# Patient Record
Sex: Female | Born: 1960 | Race: White | Hispanic: No | Marital: Married | Smoking: Current every day smoker
Health system: Southern US, Community
[De-identification: ages and names within clinical notes are randomized; demographics above are authoritative.]

## PROBLEM LIST (undated history)

## (undated) DIAGNOSIS — F329 Major depressive disorder, single episode, unspecified: Secondary | ICD-10-CM

## (undated) DIAGNOSIS — D219 Benign neoplasm of connective and other soft tissue, unspecified: Secondary | ICD-10-CM

## (undated) DIAGNOSIS — D473 Essential (hemorrhagic) thrombocythemia: Secondary | ICD-10-CM

## (undated) DIAGNOSIS — R42 Dizziness and giddiness: Secondary | ICD-10-CM

## (undated) DIAGNOSIS — R920 Mammographic microcalcification found on diagnostic imaging of breast: Secondary | ICD-10-CM

## (undated) DIAGNOSIS — IMO0002 Reserved for concepts with insufficient information to code with codable children: Secondary | ICD-10-CM

## (undated) DIAGNOSIS — R87619 Unspecified abnormal cytological findings in specimens from cervix uteri: Secondary | ICD-10-CM

## (undated) DIAGNOSIS — N809 Endometriosis, unspecified: Secondary | ICD-10-CM

## (undated) DIAGNOSIS — N946 Dysmenorrhea, unspecified: Secondary | ICD-10-CM

## (undated) DIAGNOSIS — N939 Abnormal uterine and vaginal bleeding, unspecified: Secondary | ICD-10-CM

## (undated) DIAGNOSIS — F488 Other specified nonpsychotic mental disorders: Secondary | ICD-10-CM

## (undated) DIAGNOSIS — R0600 Dyspnea, unspecified: Secondary | ICD-10-CM

## (undated) DIAGNOSIS — A770 Spotted fever due to Rickettsia rickettsii: Secondary | ICD-10-CM

## (undated) DIAGNOSIS — F419 Anxiety disorder, unspecified: Secondary | ICD-10-CM

## (undated) DIAGNOSIS — F4310 Post-traumatic stress disorder, unspecified: Secondary | ICD-10-CM

## (undated) DIAGNOSIS — N9489 Other specified conditions associated with female genital organs and menstrual cycle: Secondary | ICD-10-CM

## (undated) DIAGNOSIS — D259 Leiomyoma of uterus, unspecified: Secondary | ICD-10-CM

## (undated) DIAGNOSIS — R0609 Other forms of dyspnea: Secondary | ICD-10-CM

## (undated) DIAGNOSIS — Z5189 Encounter for other specified aftercare: Secondary | ICD-10-CM

## (undated) DIAGNOSIS — E539 Vitamin B deficiency, unspecified: Secondary | ICD-10-CM

## (undated) DIAGNOSIS — F32A Depression, unspecified: Secondary | ICD-10-CM

## (undated) DIAGNOSIS — K219 Gastro-esophageal reflux disease without esophagitis: Secondary | ICD-10-CM

## (undated) DIAGNOSIS — F323 Major depressive disorder, single episode, severe with psychotic features: Secondary | ICD-10-CM

## (undated) DIAGNOSIS — D649 Anemia, unspecified: Secondary | ICD-10-CM

## (undated) DIAGNOSIS — K259 Gastric ulcer, unspecified as acute or chronic, without hemorrhage or perforation: Secondary | ICD-10-CM

## (undated) DIAGNOSIS — R7309 Other abnormal glucose: Secondary | ICD-10-CM

## (undated) DIAGNOSIS — J439 Emphysema, unspecified: Secondary | ICD-10-CM

## (undated) DIAGNOSIS — Z87442 Personal history of urinary calculi: Secondary | ICD-10-CM

## (undated) HISTORY — DX: Major depressive disorder, single episode, unspecified: F32.9

## (undated) HISTORY — DX: Dysmenorrhea, unspecified: N94.6

## (undated) HISTORY — DX: Depression, unspecified: F32.A

## (undated) HISTORY — DX: Abnormal uterine and vaginal bleeding, unspecified: N93.9

## (undated) HISTORY — DX: Other abnormal glucose: R73.09

## (undated) HISTORY — DX: Anxiety disorder, unspecified: F41.9

## (undated) HISTORY — DX: Essential (hemorrhagic) thrombocythemia: D47.3

## (undated) HISTORY — DX: Other specified nonpsychotic mental disorders: F48.8

## (undated) HISTORY — DX: Gastric ulcer, unspecified as acute or chronic, without hemorrhage or perforation: K25.9

## (undated) HISTORY — DX: Reserved for concepts with insufficient information to code with codable children: IMO0002

## (undated) HISTORY — DX: Leiomyoma of uterus, unspecified: D25.9

## (undated) HISTORY — DX: Vitamin B deficiency, unspecified: E53.9

## (undated) HISTORY — DX: Major depressive disorder, single episode, severe with psychotic features: F32.3

## (undated) HISTORY — DX: Spotted fever due to Rickettsia rickettsii: A77.0

## (undated) HISTORY — DX: Personal history of urinary calculi: Z87.442

## (undated) HISTORY — DX: Mammographic microcalcification found on diagnostic imaging of breast: R92.0

## (undated) HISTORY — DX: Encounter for other specified aftercare: Z51.89

## (undated) HISTORY — DX: Post-traumatic stress disorder, unspecified: F43.10

## (undated) HISTORY — DX: Unspecified abnormal cytological findings in specimens from cervix uteri: R87.619

## (undated) HISTORY — DX: Gastro-esophageal reflux disease without esophagitis: K21.9

## (undated) HISTORY — DX: Dizziness and giddiness: R42

## (undated) HISTORY — DX: Other specified conditions associated with female genital organs and menstrual cycle: N94.89

## (undated) HISTORY — DX: Other forms of dyspnea: R06.09

## (undated) HISTORY — DX: Anemia, unspecified: D64.9

## (undated) HISTORY — DX: Emphysema, unspecified: J43.9

## (undated) HISTORY — DX: Dyspnea, unspecified: R06.00

## (undated) HISTORY — DX: Benign neoplasm of connective and other soft tissue, unspecified: D21.9

## (undated) HISTORY — PX: PELVIC LAPAROSCOPY: SHX162

## (undated) HISTORY — DX: Endometriosis, unspecified: N80.9

## (undated) HISTORY — PX: TUBAL LIGATION: SHX77

---
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
## 2003-01-20 ENCOUNTER — Encounter: Admission: RE | Admit: 2003-01-20 | Discharge: 2003-01-20 | Payer: Self-pay | Admitting: Internal Medicine

## 2003-01-30 ENCOUNTER — Inpatient Hospital Stay (HOSPITAL_COMMUNITY): Admission: EM | Admit: 2003-01-30 | Discharge: 2003-01-31 | Payer: Self-pay | Admitting: Psychiatry

## 2003-02-02 ENCOUNTER — Other Ambulatory Visit (HOSPITAL_COMMUNITY): Admission: RE | Admit: 2003-02-02 | Discharge: 2003-02-04 | Payer: Self-pay | Admitting: Psychiatry

## 2006-09-07 DIAGNOSIS — E539 Vitamin B deficiency, unspecified: Secondary | ICD-10-CM | POA: Insufficient documentation

## 2006-09-07 HISTORY — DX: Vitamin B deficiency, unspecified: E53.9

## 2008-01-10 HISTORY — PX: ESSURE TUBAL LIGATION: SUR464

## 2008-01-10 HISTORY — PX: COLPOSCOPY: SHX161

## 2008-02-05 DIAGNOSIS — K219 Gastro-esophageal reflux disease without esophagitis: Secondary | ICD-10-CM

## 2008-02-05 HISTORY — DX: Gastro-esophageal reflux disease without esophagitis: K21.9

## 2012-01-10 DIAGNOSIS — D473 Essential (hemorrhagic) thrombocythemia: Secondary | ICD-10-CM

## 2012-01-10 DIAGNOSIS — D75839 Thrombocytosis, unspecified: Secondary | ICD-10-CM

## 2012-01-10 DIAGNOSIS — K259 Gastric ulcer, unspecified as acute or chronic, without hemorrhage or perforation: Secondary | ICD-10-CM

## 2012-01-10 DIAGNOSIS — R7309 Other abnormal glucose: Secondary | ICD-10-CM

## 2012-01-10 HISTORY — DX: Gastric ulcer, unspecified as acute or chronic, without hemorrhage or perforation: K25.9

## 2012-01-10 HISTORY — DX: Other abnormal glucose: R73.09

## 2012-01-10 HISTORY — DX: Essential (hemorrhagic) thrombocythemia: D47.3

## 2012-01-10 HISTORY — DX: Thrombocytosis, unspecified: D75.839

## 2012-06-17 DIAGNOSIS — R920 Mammographic microcalcification found on diagnostic imaging of breast: Secondary | ICD-10-CM | POA: Insufficient documentation

## 2012-06-17 HISTORY — DX: Mammographic microcalcification found on diagnostic imaging of breast: R92.0

## 2012-07-18 ENCOUNTER — Emergency Department (HOSPITAL_COMMUNITY): Payer: Self-pay

## 2012-07-18 ENCOUNTER — Encounter (HOSPITAL_COMMUNITY): Payer: Self-pay | Admitting: Emergency Medicine

## 2012-07-18 ENCOUNTER — Emergency Department (HOSPITAL_COMMUNITY)
Admission: EM | Admit: 2012-07-18 | Discharge: 2012-07-19 | Disposition: A | Discharge status: Other Acute Inpatient Hospital | Payer: Self-pay | Attending: Emergency Medicine | Admitting: Emergency Medicine

## 2012-07-18 DIAGNOSIS — F323 Major depressive disorder, single episode, severe with psychotic features: Secondary | ICD-10-CM

## 2012-07-18 DIAGNOSIS — R0602 Shortness of breath: Secondary | ICD-10-CM | POA: Insufficient documentation

## 2012-07-18 DIAGNOSIS — F410 Panic disorder [episodic paroxysmal anxiety] without agoraphobia: Secondary | ICD-10-CM

## 2012-07-18 DIAGNOSIS — F329 Major depressive disorder, single episode, unspecified: Secondary | ICD-10-CM | POA: Insufficient documentation

## 2012-07-18 DIAGNOSIS — R45851 Suicidal ideations: Secondary | ICD-10-CM | POA: Insufficient documentation

## 2012-07-18 DIAGNOSIS — R0789 Other chest pain: Secondary | ICD-10-CM | POA: Insufficient documentation

## 2012-07-18 LAB — CBC WITH DIFFERENTIAL/PLATELET
Basophils Absolute: 0 10*3/uL (ref 0.0–0.1)
Eosinophils Relative: 1 % (ref 0–5)
HCT: 41 % (ref 36.0–46.0)
Hemoglobin: 14.9 g/dL (ref 12.0–15.0)
Lymphocytes Relative: 16 % (ref 12–46)
MCHC: 36.3 g/dL — ABNORMAL HIGH (ref 30.0–36.0)
MCV: 94.5 fL (ref 78.0–100.0)
Monocytes Absolute: 1.1 10*3/uL — ABNORMAL HIGH (ref 0.1–1.0)
Monocytes Relative: 8 % (ref 3–12)
Neutro Abs: 10.8 10*3/uL — ABNORMAL HIGH (ref 1.7–7.7)
RDW: 12.6 % (ref 11.5–15.5)
WBC: 14.4 10*3/uL — ABNORMAL HIGH (ref 4.0–10.5)

## 2012-07-18 LAB — COMPREHENSIVE METABOLIC PANEL
AST: 22 U/L (ref 0–37)
BUN: 5 mg/dL — ABNORMAL LOW (ref 6–23)
CO2: 23 mEq/L (ref 19–32)
Calcium: 9.5 mg/dL (ref 8.4–10.5)
Chloride: 100 mEq/L (ref 96–112)
Creatinine, Ser: 0.6 mg/dL (ref 0.50–1.10)
GFR calc Af Amer: >90 mL/min (ref >90)
GFR calc non Af Amer: >90 mL/min (ref >90)
Total Bilirubin: 0.5 mg/dL (ref 0.3–1.2)

## 2012-07-18 LAB — POCT I-STAT TROPONIN I: Troponin i, poc: 0 ng/mL (ref 0.00–0.08)

## 2012-07-18 NOTE — ED Provider Notes (Signed)
History    CSN: [id number] Arrival date & time 07/18/12  2039  First MD Initiated Contact with Patient 07/18/12 2349     Chief Complaint  Patient presents with  . Panic Attack   (Consider location/radiation/quality/duration/timing/severity/associated sxs/prior Treatment) HPI Level 5 Caveat: refusing to answer questions coherently. This is a 52 year old female who received a document advising her of lawsuit about 2 weeks ago. Her husband states that subsequently she has become very at stated, depressed with difficulty sleeping and increasingly frequent panic attacks. She also believes that she is being followed and that someone such as the FBI is out to get her.  She is here this evening having had a panic attack about 5:30 PM while in a parking lot. The panic attack was described as difficulty catching her breath, palpitations and chest tightness. Her symptoms are consistent with prior panic attacks. When asked how long her panic attack lasted she replied "not long enough". When asked what this meant she replied "sometimes you can just work them out". She was unable to further explain her statements. The symptoms have largely resolved.  While in the ED she was observed taking the pill in the bathroom. She admits to taking 2 hydrocodone tablets and subsequently admitted to taking "a handful". She states she took the first 2 hydrocodone for headache. She subsequently admits that she is suicidal.  History reviewed. No pertinent past medical history. History reviewed. No pertinent past surgical history. No family history on file. History  Substance Use Topics  . Smoking status: Never Smoker   . Smokeless tobacco: Not on file  . Alcohol Use: No   OB History   Grav Para Term Preterm Abortions TAB SAB Ect Mult Living                 Review of Systems  Unable to perform ROS   Allergies  Review of patient's allergies indicates no known allergies.  Home Medications  No current  outpatient prescriptions on file. BP 158/78  Pulse 96  Temp(Src) 98.3 F (36.8 C) (Oral)  Resp 20  SpO2 100%  LMP 07/07/2012  Physical Exam General: Well-developed, cachectic female in no acute distress; appearance consistent with age of record HENT: normocephalic, atraumatic Eyes: pupils equal round and reactive to light; extraocular muscles intact Neck: supple Heart: regular rate and rhythm Lungs: clear to auscultation bilaterally Abdomen: soft; nondistended; nontender; no masses or hepatosplenomegaly; bowel sounds present Extremities: No deformity; full range of motion; pulses normal Neurologic: Awake, alert and oriented; motor function intact in all extremities and symmetric; no facial droop Skin: Warm and dry Psychiatric: Tearful; suicidal ideation; inappropriate answers to questions    ED Course  Procedures (including critical care time)    MDM   Nursing notes and vitals signs, including pulse oximetry, reviewed.  Summary of this visit's results, reviewed by myself:  Labs:  Results for orders placed during the hospital encounter of 07/18/12 (from the past 24 hour(s))  CBC WITH DIFFERENTIAL     Status: Abnormal   Collection Time    07/18/12  8:50 PM      Result Value Range   WBC 14.4 (*) 4.0 - 10.5 K/uL   RBC 4.34  3.87 - 5.11 MIL/uL   Hemoglobin 14.9  12.0 - 15.0 g/dL   HCT 40.9  81.1 - 91.4 %   MCV 94.5  78.0 - 100.0 fL   MCH 34.3 (*) 26.0 - 34.0 pg   MCHC 36.3 (*) 30.0 - 36.0 g/dL  RDW 12.6  11.5 - 15.5 %   Platelets 421 (*) 150 - 400 K/uL   Neutrophils Relative % 75  43 - 77 %   Neutro Abs 10.8 (*) 1.7 - 7.7 K/uL   Lymphocytes Relative 16  12 - 46 %   Lymphs Abs 2.3  0.7 - 4.0 K/uL   Monocytes Relative 8  3 - 12 %   Monocytes Absolute 1.1 (*) 0.1 - 1.0 K/uL   Eosinophils Relative 1  0 - 5 %   Eosinophils Absolute 0.1  0.0 - 0.7 K/uL   Basophils Relative 0  0 - 1 %   Basophils Absolute 0.0  0.0 - 0.1 K/uL  COMPREHENSIVE METABOLIC PANEL     Status:  Abnormal   Collection Time    07/18/12  8:50 PM      Result Value Range   Sodium 136  135 - 145 mEq/L   Potassium 3.5  3.5 - 5.1 mEq/L   Chloride 100  96 - 112 mEq/L   CO2 23  19 - 32 mEq/L   Glucose, Bld 129 (*) 70 - 99 mg/dL   BUN 5 (*) 6 - 23 mg/dL   Creatinine, Ser 1.61  0.50 - 1.10 mg/dL   Calcium 9.5  8.4 - 09.6 mg/dL   Total Protein 7.5  6.0 - 8.3 g/dL   Albumin 4.2  3.5 - 5.2 g/dL   AST 22  0 - 37 U/L   ALT 11  0 - 35 U/L   Alkaline Phosphatase 63  39 - 117 U/L   Total Bilirubin 0.5  0.3 - 1.2 mg/dL   GFR calc non Af Amer >90  >90 mL/min   GFR calc Af Amer >90  >90 mL/min  POCT I-STAT TROPONIN I     Status: None   Collection Time    07/18/12  9:01 PM      Result Value Range   Troponin i, poc 0.00  0.00 - 0.08 ng/mL   Comment 3           ETHANOL     Status: None   Collection Time    07/19/12 12:15 AM      Result Value Range   Alcohol, Ethyl (B) <11  0 - 11 mg/dL  ACETAMINOPHEN LEVEL     Status: Abnormal   Collection Time    07/19/12 12:15 AM      Result Value Range   Acetaminophen (Tylenol), Serum 94.8 (*) 10 - 30 ug/mL  SALICYLATE LEVEL     Status: Abnormal   Collection Time    07/19/12 12:15 AM      Result Value Range   Salicylate Lvl <2.0 (*) 2.8 - 20.0 mg/dL  URINALYSIS, ROUTINE W REFLEX MICROSCOPIC     Status: Abnormal   Collection Time    07/19/12  2:05 AM      Result Value Range   Color, Urine YELLOW  YELLOW   APPearance CLOUDY (*) CLEAR   Specific Gravity, Urine 1.013  1.005 - 1.030   pH 6.0  5.0 - 8.0   Glucose, UA NEGATIVE  NEGATIVE mg/dL   Hgb urine dipstick NEGATIVE  NEGATIVE   Bilirubin Urine NEGATIVE  NEGATIVE   Ketones, ur 15 (*) NEGATIVE mg/dL   Protein, ur NEGATIVE  NEGATIVE mg/dL   Urobilinogen, UA 0.2  0.0 - 1.0 mg/dL   Nitrite NEGATIVE  NEGATIVE   Leukocytes, UA NEGATIVE  NEGATIVE  URINE RAPID DRUG SCREEN (HOSP PERFORMED)  Status: Abnormal   Collection Time    07/19/12  2:39 AM      Result Value Range   Opiates POSITIVE (*)  NONE DETECTED   Cocaine NONE DETECTED  NONE DETECTED   Benzodiazepines NONE DETECTED  NONE DETECTED   Amphetamines NONE DETECTED  NONE DETECTED   Tetrahydrocannabinol NONE DETECTED  NONE DETECTED   Barbiturates NONE DETECTED  NONE DETECTED  ACETAMINOPHEN LEVEL     Status: Abnormal   Collection Time    07/19/12  4:13 AM      Result Value Range   Acetaminophen (Tylenol), Serum 34.4 (*) 10 - 30 ug/mL    Imaging Studies: Dg Chest 2 View  07/18/2012   *RADIOLOGY REPORT*  Clinical Data: Chest tightness, shortness of breath, nausea, headache, palpitations.  CHEST - 2 VIEW  Comparison: None.  Findings: Shallow inspiration. The heart size and pulmonary vascularity are normal. The lungs appear clear and expanded without focal air space disease or consolidation. No blunting of the costophrenic angles.  No pneumothorax.  Mediastinal contours appear intact.  Mild degenerative changes in the spine.  IMPRESSION: No evidence of active pulmonary disease.   Original Report Authenticated By: Burman Nieves, M.D.   EKG Interpretation:  Date & Time: 07/18/2012 8:43 PM  Rate: 108  Rhythm: sinus tachycardia  QRS Axis: normal  Intervals: normal  ST/T Wave abnormalities: normal  Conduction Disutrbances:none  Narrative Interpretation: Poor R-wave progression  Old EKG Reviewed: none available  5:45 AM Patient will call after IM Geodon. Four-hour Tylenol level not consistent with toxic level. ACT has evaluated and will seek placement.       Hanley Seamen, MD 07/19/12 [phone]

## 2012-07-18 NOTE — ED Notes (Signed)
PT. FOUND AT WAITING AREA RESTROOM WITH A PILL IN HER LEFT HAND , FAMILY REPORTED THAT SHE TRIED TO OVERDOSE ON IT AND SHE HAD A HISTORY OF SUICIDAL ATTEMPTS IN THE PAST.

## 2012-07-18 NOTE — ED Notes (Signed)
PT. REPORTS GENERALIZED WEAKNESS WITH CHEST TIGHTNESS AND POOR APPETITE FOR SEVERAL DAYS WITH SLIGHT SOB . DENIES NAUSEA OR DIAPHORESIS.

## 2012-07-18 NOTE — ED Notes (Addendum)
Pt states she was brought in for a panic attack. Pt A&O X 4. Breathing even and unlabored. Nad noted. Pt repeatedly falling asleep during asleep. Denies pain or any complaints at this time.

## 2012-07-18 NOTE — ED Notes (Signed)
PT. ASSISTED BACK TO TRIAGE RM. 2 . WITH NT CHAPERONE .

## 2012-07-19 ENCOUNTER — Encounter (HOSPITAL_COMMUNITY): Payer: Self-pay | Admitting: Medical

## 2012-07-19 ENCOUNTER — Inpatient Hospital Stay (HOSPITAL_COMMUNITY)
Admission: AD | Admit: 2012-07-19 | Discharge: 2012-07-24 | DRG: 885 | Disposition: A | Discharge status: Home / Self Care | Payer: No Typology Code available for payment source | Source: Ambulatory Visit | Attending: Psychiatry | Admitting: Psychiatry

## 2012-07-19 ENCOUNTER — Encounter (HOSPITAL_COMMUNITY): Payer: Self-pay | Admitting: *Deleted

## 2012-07-19 DIAGNOSIS — R45851 Suicidal ideations: Secondary | ICD-10-CM

## 2012-07-19 DIAGNOSIS — F323 Major depressive disorder, single episode, severe with psychotic features: Principal | ICD-10-CM | POA: Diagnosis present

## 2012-07-19 DIAGNOSIS — F411 Generalized anxiety disorder: Secondary | ICD-10-CM | POA: Diagnosis present

## 2012-07-19 DIAGNOSIS — F1010 Alcohol abuse, uncomplicated: Secondary | ICD-10-CM | POA: Diagnosis present

## 2012-07-19 DIAGNOSIS — F410 Panic disorder [episodic paroxysmal anxiety] without agoraphobia: Secondary | ICD-10-CM | POA: Diagnosis present

## 2012-07-19 DIAGNOSIS — F200 Paranoid schizophrenia: Secondary | ICD-10-CM | POA: Diagnosis present

## 2012-07-19 LAB — SALICYLATE LEVEL: Salicylate Lvl: <2 mg/dL — ABNORMAL LOW (ref 2.8–20.0)

## 2012-07-19 LAB — ETHANOL: Alcohol, Ethyl (B): <11 mg/dL (ref 0–11)

## 2012-07-19 LAB — URINALYSIS, ROUTINE W REFLEX MICROSCOPIC
Hgb urine dipstick: NEGATIVE
Leukocytes, UA: NEGATIVE
Nitrite: NEGATIVE
Protein, ur: NEGATIVE mg/dL
Specific Gravity, Urine: 1.013 (ref 1.005–1.030)
Urobilinogen, UA: 0.2 mg/dL (ref 0.0–1.0)

## 2012-07-19 LAB — ACETAMINOPHEN LEVEL
Acetaminophen (Tylenol), Serum: 94.8 ug/mL — ABNORMAL HIGH (ref 10–30)
Acetaminophen (Tylenol), Serum: 34.4 ug/mL — ABNORMAL HIGH (ref 10–30)

## 2012-07-19 LAB — RAPID URINE DRUG SCREEN, HOSP PERFORMED: Barbiturates: NOT DETECTED

## 2012-07-19 MED ORDER — ALUM & MAG HYDROXIDE-SIMETH 200-200-20 MG/5ML PO SUSP: 30.0000 mL | ORAL | Status: DC | PRN

## 2012-07-19 MED ORDER — MAGNESIUM HYDROXIDE 400 MG/5ML PO SUSP: 30.0000 mL | Freq: Every day | ORAL | Status: DC | PRN

## 2012-07-19 MED ORDER — ZIPRASIDONE MESYLATE 20 MG IM SOLR
10.0000 mg | Freq: Once | INTRAMUSCULAR | Status: AC
  Administered 2012-07-19: 09:00:00 via INTRAMUSCULAR
  Filled 2012-07-19: qty 20

## 2012-07-19 MED ORDER — ONDANSETRON HCL 4 MG PO TABS: 4.0000 mg | ORAL_TABLET | Freq: Three times a day (TID) | ORAL | Status: DC | PRN

## 2012-07-19 MED ORDER — ONDANSETRON 4 MG PO TBDP
8.0000 mg | ORAL_TABLET | Freq: Once | ORAL | Status: AC
  Administered 2012-07-19: 8 mg via ORAL
  Filled 2012-07-19: qty 2

## 2012-07-19 MED ORDER — LORAZEPAM 1 MG PO TABS: 1.0000 mg | ORAL_TABLET | Freq: Three times a day (TID) | ORAL | Status: DC | PRN

## 2012-07-19 NOTE — Progress Notes (Signed)
Initial nursing admit note- UTA patient due to severe confusion and disorientation.  Is unsteady on feet.  Search and belongings check completed.  Escorted to [address] hall and will complete admission when she is more oriented.  Fall precautions initiated and report to MHT.

## 2012-07-19 NOTE — ED Notes (Signed)
Pt ambulated to restroom accompanied by sitter.  

## 2012-07-19 NOTE — ED Notes (Signed)
Pt vomited small yellow substance in bathroom. Pt states she is tired and is paranoid with people walking around. Pt can barely sit still.

## 2012-07-19 NOTE — ED Notes (Addendum)
Sitter at bedside, pt resting

## 2012-07-19 NOTE — Progress Notes (Addendum)
All documents have been signed by the pt and faxed to the Assessment Department. Pt did not have to be IVC'D.  Denice Bors, AADC 07/19/2012 10:31 AM

## 2012-07-19 NOTE — ED Notes (Signed)
Breakfast tray ordered 

## 2012-07-19 NOTE — ED Notes (Addendum)
Pt woke up by phlebotomy to collect blood samples. Pt woke up in a frantic. Pt very anxious and climbed over the rail of the bed to stand in the corner. Pt states she is scared and frightened. Pt states she is tired and do not want to wake up. Rn explained to patient the importance of getting blood work due to not knowing how much medication she ingested. Pt calmed down after about 3 minutes. Stating she is not crazy and knows exactly what she is doing. Pt was able to let phlebotomy collect her blood with no problem.

## 2012-07-19 NOTE — ED Notes (Signed)
Family at bedside. Son 

## 2012-07-19 NOTE — BH Assessment (Signed)
Assessment Note   Ashley Mosley is an 52 y.o. female.  Patient brought to [facility] with complaint of headache & anxiety.  Pt was sleeping when this clinician woke her for assessment.  Patient woke with a start and began crying.  Patient would stare off into space when asked questions as if gathering her thoughts.  She said that she has not slept in a long time and she wanted to answer questions correctly.  Patient looks about the room at times and wonders aloud about activity of people far down the hallway.  She said that she took about five of her oxycodone 325s in an effort to address migraine pain.  She denies any suicide attempt.  Patient has a history however of one previous suicide attempt a few years ago.  She was in [facility] in 2005 by her report.  Patient said that she had called the FBI yesterday and they confirmed for her that people are outside her home talking about her and watching her activities.  Patient is very paranoid and suspicious of activity around her.  Patient needs a lot of verbal redirection to calm.  She cannot tell much history of what is going on at this time.  She did say that she has a psychiatrist named Dr. Cannon Kettle.  Patient admits to drinking 2-3 mixed drinks a night to help her sleep.  She is unable to currently care for herself and is in danger of further decompensation if not treated with inpatient psychiatric care for stabilization.  Patient will be run by [facility] for placement consideration. Axis I: Alcohol Abuse and Psychotic Disorder NOS Axis II: Deferred Axis III: History reviewed. No pertinent past medical history. Axis IV: occupational problems and other psychosocial or environmental problems Axis V: 21-30 behavior considerably influenced by delusions or hallucinations OR serious impairment in judgment, communication OR inability to function in almost all areas  Past Medical History: History reviewed. No pertinent past medical history.  History reviewed. No  pertinent past surgical history.  Family History: No family history on file.  Social History:  reports that she has never smoked. She does not have any smokeless tobacco history on file. She reports that she does not drink alcohol or use illicit drugs.  Additional Social History:  Alcohol / Drug Use Pain Medications: See PTA medication list Prescriptions: See PTA medication list Over the Counter: See PTA medication list History of alcohol / drug use?: Yes Substance #1 Name of Substance 1: ETOH (mostly mixed drinks 1 - Age of First Use: Unknown 1 - Amount (size/oz): pt reports 2-3 mixed drinks in evening 1 - Frequency: Daily consumption 1 - Duration: On-going 1 - Last Use / Amount: 07/10  CIWA: CIWA-Ar BP: 124/73 mmHg Pulse Rate: 77 COWS: Clinical Opiate Withdrawal Scale (COWS) Resting Pulse Rate: Pulse Rate 81-100 Sweating: No report of chills or flushing Restlessness: Frequent shifting or extraneous movements of legs/arms Pupil Size: Pupils possibly larger than normal for room light Bone or Joint Aches: Mild diffuse discomfort (Aches in arms and legs) Runny Nose or Tearing: Nose running or tearing GI Upset: Vomiting or diarrhea (small amounts of vomit) Tremor: Slight tremor observable Yawning: Yawning once or twice during assessment Anxiety or Irritability: Patient obviously irritable/anxious Gooseflesh Skin: Skin is smooth COWS Total Score: 16  Allergies: No Known Allergies  Home Medications:  (Not in a hospital admission)  OB/GYN Status:  Patient's last menstrual period was 07/07/2012.  General Assessment Data Location of Assessment: [facility] ED Living Arrangements: Spouse/significant other (  Boyfriend lives with her) Can pt return to current living arrangement?: Yes Admission Status: Voluntary Is patient capable of signing voluntary admission?: Yes Transfer from: Acute Hospital Referral Source: Self/Family/Friend     Risk to self Suicidal Ideation: No Suicidal  Intent: No Is patient at risk for suicide?: No Suicidal Plan?: No Access to Means: Yes Specify Access to Suicidal Means: Does have medications What has been your use of drugs/alcohol within the last 12 months?: ETOH use Previous Attempts/Gestures: Yes How many times?: 1 Other Self Harm Risks: None Triggers for Past Attempts: Unknown Intentional Self Injurious Behavior: None Family Suicide History: Unknown Recent stressful life event(s): Other (Comment) (Pt unclear about recent stress) Persecutory voices/beliefs?: Yes Depression: Yes Depression Symptoms: Tearfulness;Insomnia;Isolating Substance abuse history and/or treatment for substance abuse?: Yes Suicide prevention information given to non-admitted patients: Not applicable  Risk to Others Homicidal Ideation: No Thoughts of Harm to Others: No Current Homicidal Intent: No Current Homicidal Plan: No Access to Homicidal Means: No Identified Victim: No one History of harm to others?: No Assessment of Violence: None Noted (Unable to assess) Violent Behavior Description: N/A Does patient have access to weapons?: No Criminal Charges Pending?: No Does patient have a court date: No  Psychosis Hallucinations: Auditory (Voices outside the home, people watching her, under surveila) Delusions: Grandiose;Persecutory (Claims FBI agrees that people are watching her home.)  Mental Status Report Appear/Hygiene: Disheveled;Poor hygiene Eye Contact: Fair Motor Activity: Freedom of movement;Restlessness Speech: Tangential;Incoherent Level of Consciousness: Sleeping;Restless (Vacillates between sleep & restlessness) Mood: Anxious;Suspicious;Apprehensive;Helpless Affect: Anxious;Irritable;Sad Anxiety Level: Severe Thought Processes: Irrelevant;Tangential;Flight of Ideas Judgement: Impaired Orientation: Person;Place;Time;Situation Obsessive Compulsive Thoughts/Behaviors: Severe  Cognitive Functioning Concentration: Decreased Memory:  Recent Impaired;Remote Impaired IQ: Average Insight: Poor Impulse Control: Poor Appetite: Poor Weight Loss: 0 Weight Gain: 0 Sleep: Decreased Total Hours of Sleep:  (<4H/D) Vegetative Symptoms: Decreased grooming  ADLScreening [facility]) Patient's cognitive ability adequate to safely complete daily activities?: Yes Patient able to express need for assistance with ADLs?: Yes Independently performs ADLs?: Yes (appropriate for developmental age)  Abuse/Neglect [facility]) Physical Abuse:  (Unable to assess) Verbal Abuse:  (Unable to assess) Sexual Abuse:  (Unable to assess)  Prior Inpatient Therapy Prior Inpatient Therapy: Yes Prior Therapy Dates: 2005 Prior Therapy Facilty/Provider(s): [facility] Reason for Treatment: Unknown  Prior Outpatient Therapy Prior Outpatient Therapy: Yes Prior Therapy Dates: Pt unclear Prior Therapy Facilty/Provider(s): Dr. Cannon Kettle Reason for Treatment: Med management?  ADL Screening (condition at time of admission) Patient's cognitive ability adequate to safely complete daily activities?: Yes Is the patient deaf or have difficulty hearing?: No Does the patient have difficulty seeing, even when wearing glasses/contacts?: No Does the patient have difficulty concentrating, remembering, or making decisions?: Yes Patient able to express need for assistance with ADLs?: Yes Does the patient have difficulty dressing or bathing?: No Independently performs ADLs?: Yes (appropriate for developmental age) Does the patient have difficulty walking or climbing stairs?: Yes (Pt is a falls risk) Weakness of Legs: Both (Pt currently a falls risk) Weakness of Arms/Hands: None  Home Assistive Devices/Equipment Home Assistive Devices/Equipment: None    Abuse/Neglect Assessment (Assessment to be complete while patient is alone) Physical Abuse:  (Unable to assess) Verbal Abuse:  (Unable to assess) Sexual Abuse:  (Unable to assess) Exploitation of  patient/patient's resources: Denies Self-Neglect: Denies     Merchant navy officer (For Healthcare) Advance Directive: Patient does not have advance directive;Patient would not like information    Additional Information 1:1 In Past 12 Months?: No CIRT Risk: No Elopement  Risk: No Does patient have medical clearance?: Yes     Disposition:  Disposition Initial Assessment Completed for this Encounter: Yes Disposition of Patient: Inpatient treatment program;Referred to Type of inpatient treatment program: Adult Patient referred to:  (Referred to [facility] for placement consideration.)  On Site Evaluation by:   Reviewed with Physician:  Dr. Nanetta Batty, Berna Spare Ray 07/19/2012 7:46 AM

## 2012-07-19 NOTE — ED Notes (Signed)
Pt belongings given to family/ Son

## 2012-07-19 NOTE — BH Assessment (Signed)
[facility] Assessment Progress Note  Pt reviewed with Carolanne Grumbling, MD, who agrees to accept her to [facility] to the service of Thedore Mins, MD, Rm [id number].  At 08:35 I called Ranae Pila, Assessment Counselor, to notify her.  Doylene Canning, MA Assessment Counselor 07/19/2012 @ 08:36

## 2012-07-19 NOTE — Progress Notes (Signed)
[facility] Group Notes:  (Nursing/MHT/Case Management/Adjunct)  Date:  07/19/2012  Time:  2000  Type of Therapy:  Psychoeducational Skills  Participation Level:  Minimal  Participation Quality:  Attentive  Affect:  Depressed  Cognitive:  Disorganized  Insight:  Lacking  Engagement in Group:  Lacking  Modes of Intervention:  Education  Summary of Progress/Problems: The patient verbalized in group that she had a bad day until the evening. She states that she has only met with a nurse and no doctor as of this time. Her goal for tomorrow is to get more rest as she typically has difficulty sleeping.   Hazle Coca S 07/19/2012, 8:58 PM

## 2012-07-19 NOTE — ED Notes (Signed)
Breakfast tray delivered

## 2012-07-19 NOTE — ED Notes (Signed)
Pt moved to C21 due to being too paranoid looking into the hallway.  Pt questioned why people were in the hallway. Pt questioned why people were on different beds. Pt was frightened by [employer] coming past her door around doing rounds on everyone. Every noise heard by patient frightened her out her sleep. Pt redirected to calm down. Pt moved to room next door so she would not be able to be distracted by people in hallway and be able to rest. Pt crying and very anxious. Pt reassured we are here to help her. Pt calmed down and is resting at the moment with sitter by bedside

## 2012-07-19 NOTE — ED Notes (Addendum)
Called report to [facility] and called security to transport patient to [facility].  Act team at bedside, explaining transfer to [facility] top patient and family.

## 2012-07-19 NOTE — ED Notes (Signed)
ACT team by the bedside

## 2012-07-19 NOTE — ED Notes (Addendum)
Pt asked RN to write down  3 phone numbers and stated she had just remembered something that she is refusing to explain to RN.  Pt states "something is not right with her son and that she is a Psychologist, educational".  Pt allowed to use the phone to call her sons father and leave a message.  Pt very agitated and acting paranoid.  Pt went to restroom and states she is sick to her stomach over all that she knows

## 2012-07-19 NOTE — ED Notes (Signed)
Sitter at bedside.

## 2012-07-19 NOTE — ED Notes (Addendum)
Pt states she feels  Nauseated. Pt is very anxious. Pt is very paranoid by the door. Pt wants the door open as wide as can be. Pt is in a monitored room due to suicidal ideation. Pt family member (son) left. He was told all the rules and given an update on the possibility of the outcome of the patient at this moment. Pt will be continued to be monitored throughout the night. Sitter at the bedside. Security has wanded the patient at the bedside. Pt did not bring any belongings with her to Pod C with RN and family.

## 2012-07-20 DIAGNOSIS — F22 Delusional disorders: Secondary | ICD-10-CM

## 2012-07-20 DIAGNOSIS — F29 Unspecified psychosis not due to a substance or known physiological condition: Secondary | ICD-10-CM

## 2012-07-20 DIAGNOSIS — F1110 Opioid abuse, uncomplicated: Secondary | ICD-10-CM

## 2012-07-20 MED ORDER — BENZTROPINE MESYLATE 1 MG PO TABS
1.0000 mg | ORAL_TABLET | Freq: Once | ORAL | Status: AC
  Administered 2012-07-20: 1 mg via ORAL
  Filled 2012-07-20 (×2): qty 1

## 2012-07-20 MED ORDER — HALOPERIDOL 5 MG PO TABS
5.0000 mg | ORAL_TABLET | Freq: Once | ORAL | Status: AC
  Administered 2012-07-20: 5 mg via ORAL
  Filled 2012-07-20 (×2): qty 1

## 2012-07-20 MED ORDER — LORAZEPAM 2 MG/ML IJ SOLN: 1.0000 mg | Freq: Four times a day (QID) | INTRAMUSCULAR | Status: DC | PRN

## 2012-07-20 MED ORDER — HALOPERIDOL 2 MG PO TABS
2.0000 mg | ORAL_TABLET | Freq: Two times a day (BID) | ORAL | Status: DC
  Administered 2012-07-21 – 2012-07-22 (×3): 2 mg via ORAL
  Filled 2012-07-20 (×6): qty 1

## 2012-07-20 MED ORDER — LORAZEPAM 1 MG PO TABS
1.0000 mg | ORAL_TABLET | Freq: Four times a day (QID) | ORAL | Status: DC | PRN
  Administered 2012-07-21: 1 mg via ORAL
  Filled 2012-07-20: qty 1

## 2012-07-20 NOTE — Progress Notes (Signed)
Patient ID: Ashley Mosley, female   DOB: Dec 23, 1960, 52 y.o.   MRN: [id number]  D:  Pt was reluctant to walk with the writer for an assessment. During the assessment writer observed that pt was paranoid. Stated "it's not just an coincidence that she knows some of the other pt's on the hall." Pt believes that several of the pt's have been into "her restaurant".  Pt also believes the FBI is still watching her. Stated she knows she needs help, but doesn't believe she needs to be locked up.  A:  Support and encouragement was offered. 15 min checks continued for safety.  R: Pt remains safe.

## 2012-07-20 NOTE — Progress Notes (Signed)
Psychoeducational Group Note  Date:  07/20/2012 Time:  0945 am  Group Topic/Focus:  Identifying Needs:   The focus of this group is to help patients identify their personal needs that have been historically problematic and identify healthy behaviors to address their needs.  Participation Level:  Active  Participation Quality:  Monopolizing and Redirectable  Affect:  Depressed  Cognitive:  Delusional  Insight:  Limited  Engagement in Group:  Monopolizing  Additional Comments:    Andrena Mews 07/20/2012,10:53 AM

## 2012-07-20 NOTE — Progress Notes (Signed)
The focus of this group is to help patients review their daily goal of treatment and discuss progress on daily workbooks. Pt attended the evening group session and responded to all discussion prompts from the Writer. Pt reported having a difficult day before going on a disorganized ramble about how she knew why everyone was really here, how she knew who they really were and how she wanted to say something to everyone in the cafeteria but was unable to bring herself to do so. "I'm not that crazy. I know what's going on here." Attempts at re-direction to bring the Pt back on topic were unsuccessful. Pt's affect fluctuated from tearful to slightly irritated.

## 2012-07-20 NOTE — Progress Notes (Signed)
Adult Psychoeducational Group Note  Date:  07/20/2012 Time:  2:22 PM  Group Topic/Focus:  Theraputic Activity  Participation Level:  Minimal  Participation Quality:  disorganized  Affect:  Blunted  Cognitive:  Disorganized and Hallucinating  Insight: Limited  Engagement in Group:  Off Topic and Poor  Modes of Intervention:  Activity  Additional Comments:  Ashley Mosley attended group but was disorganized and hallucinating for the first half. At about the half way point, Ashley Mosley began to be on topic and was able to focus some.   [name] 07/20/2012, 2:22 PM

## 2012-07-20 NOTE — [facility] Suicide Risk Assessment (Signed)
Suicide Risk Assessment  Admission Assessment     Nursing information obtained from:  Patient Demographic factors:  Caucasian;Low socioeconomic status Current Mental Status:  Suicidal ideation indicated by others Loss Factors:    Historical Factors:    Risk Reduction Factors:     CLINICAL FACTORS:   Panic Attacks Depression:   Impulsivity Insomnia Severe  COGNITIVE FEATURES THAT CONTRIBUTE TO RISK:  Closed-mindedness Loss of executive function Polarized thinking Thought constriction (tunnel vision)    SUICIDE RISK:   Moderate:  Frequent suicidal ideation with limited intensity, and duration, some specificity in terms of plans, no associated intent, good self-control, limited dysphoria/symptomatology, some risk factors present, and identifiable protective factors, including available and accessible social support.  PLAN OF CARE: Please see history and physical examination for more information. I certify that inpatient services furnished can reasonably be expected to improve the patient's condition.  Valleri Hendricksen T. 07/20/2012, 3:02 PM
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
## 2012-07-20 NOTE — Progress Notes (Signed)
D   Pt is anxious depressed and tearful at times  She is having some thought blocking and having difficulty putting her thoughts into words   She easily looses her train of thought and takes a long time to try to answer a question   She said she feels like she has always known everybody here and that helps her feel safe enough to share some of her feelings   A   Verbal support given   Medications administered and effectiveness monitored   Q 15 min checks R   Pt safe at present

## 2012-07-21 DIAGNOSIS — F332 Major depressive disorder, recurrent severe without psychotic features: Secondary | ICD-10-CM
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
## 2012-07-21 NOTE — Progress Notes (Signed)
Pt remains anxious, tense but is slightly better this evening. Continues to thought block. Pt preoccupied, paranoid. Pt did agree to take ativan to promote sleep which on reassess was effective. Pt supported, encouraged. Denies SI/HI/AVH and remains safe.Lawrence Marseilles

## 2012-07-21 NOTE — Progress Notes (Signed)
Pt remains delusional in thought. Continues to state she knows all of the people present on the unit - both patients and staff. She continues to display thought blocking and when she does speak is often tangential. Remains nervous, tense and anxious with brief eye contact with this Clinical research associate. Stated she was worried she wouldn't be able to sleep but when told there was a new order for haldol that would aid in her sleep, she refused. Explained to pt she had already had a dose of haldol without incident; several attempts, encouragement offered but pt continued to refuse med. She denies SI/HI/AVh and remains safe. Lawrence Marseilles

## 2012-07-21 NOTE — Progress Notes (Signed)
D   Pt is irritable and anxious  She continues with delusional beliefs and is paranoid  She said the haldol did slow her racing thoughts but then she said it was supressing her and she didn't want it   She has poor concentration and has difficulty expressing herself and verbalizing   She continues to think she knows everybody here and they are here because of her A   Verbal support given   Medications administered and effectiveness monitored   Q 15 min checks R   Pt safe at present
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
## 2012-07-21 NOTE — Progress Notes (Signed)
Psychoeducational Group Note  Date:  07/21/2012 Time:  0945 am  Group Topic/Focus:  Making Healthy Choices:   The focus of this group is to help patients identify negative/unhealthy choices they were using prior to admission and identify positive/healthier coping strategies to replace them upon discharge.  Participation Level:  Active  Participation Quality:  Appropriate  Affect:  Appropriate  Cognitive:  Alert and Appropriate  Insight:  Improving  Engagement in Group:  Improving  Additional Comments:    Andrena Mews 07/21/2012, 10:29 AM

## 2012-07-21 NOTE — [facility] Group Notes (Signed)
[facility] Group Notes:  (Clinical Social Work)  07/21/2012   11:15-12:00PM  Summary of Progress/Problems:  The main focus of today's process group was to listen to a variety of genres of music and to identify that different types of music provoke different responses.  The patient then was able to identify personally what was soothing for them, as well as energizing.  Handouts were used to record feelings evoked, as well as how patient can personally use this knowledge in sleep habits, with depression, and with other symptoms.  The patient was tearful, sobbing during 2 of the songs.  CSW gave her tissue, and asked if the music specifically was bothering her and should be stopped.  Each time she stated that it should just play.  She then talked about some songs overwhelming her.  She demonstrated some ability to analyze her own feelings without paranoia or delusional thought during this time in group.  Type of Therapy:  Music Therapy   Participation Level:  Active  Participation Quality:  Attentive  Affect:  Tearful  Cognitive:  Disorganized  Insight:  Developing/Improving  Engagement in Therapy:  Developing/Improving  Modes of Intervention:   Activity, Exploration  Ambrose Mantle, LCSW 07/21/2012, 4:23 PM

## 2012-07-22 DIAGNOSIS — F323 Major depressive disorder, single episode, severe with psychotic features: Principal | ICD-10-CM | POA: Diagnosis present

## 2012-07-22 DIAGNOSIS — F411 Generalized anxiety disorder: Secondary | ICD-10-CM | POA: Diagnosis present

## 2012-07-22 DIAGNOSIS — F200 Paranoid schizophrenia: Secondary | ICD-10-CM

## 2012-07-22 HISTORY — DX: Major depressive disorder, single episode, severe with psychotic features: F32.3

## 2012-07-22 MED ORDER — BENZTROPINE MESYLATE 1 MG PO TABS
1.0000 mg | ORAL_TABLET | Freq: Every day | ORAL | Status: DC
  Administered 2012-07-22 – 2012-07-23 (×2): 1 mg via ORAL
  Filled 2012-07-22: qty 1
  Filled 2012-07-22: qty 14
  Filled 2012-07-22 (×2): qty 1

## 2012-07-22 MED ORDER — CITALOPRAM HYDROBROMIDE 10 MG PO TABS
10.0000 mg | ORAL_TABLET | Freq: Every day | ORAL | Status: DC
  Administered 2012-07-22 – 2012-07-24 (×3): 10 mg via ORAL
  Filled 2012-07-22 (×4): qty 1
  Filled 2012-07-22: qty 14

## 2012-07-22 MED ORDER — HALOPERIDOL 5 MG PO TABS
5.0000 mg | ORAL_TABLET | Freq: Every day | ORAL | Status: DC
  Administered 2012-07-23: 5 mg via ORAL
  Filled 2012-07-22: qty 14
  Filled 2012-07-22 (×2): qty 1

## 2012-07-22 NOTE — Progress Notes (Signed)
Patient ID: Ashley Mosley, female   DOB: [birth year], 52 y.o.   MRN: [id number] [facility] MD Progress Note  07/22/2012 11:01 AM Ashley Mosley  MRN:  [id number] Subjective:  " I am still feeling paranoid and anxious." Objective: Patient reports that she has been sleeping better but remains delusional, anxious and depressed. Patient has not reported any adverse reactions to her medication. Her thought process remains disorganized.   Diagnosis:  Axis I: Major Depression, Recurrent severe and Psychotic Disorder NOS                                Anxiety disorder, NOS  ADL's:  Impaired  Sleep: Fair  Appetite:  Fair  Suicidal Ideation:  Plan:  None Intent:  none Homicidal Ideation:  Plan:  none Intent:  none AEB (as evidenced by):  Psychiatric Specialty Exam: Review of Systems  Psychiatric/Behavioral: Positive for depression and hallucinations. The patient is nervous/anxious and has insomnia.     Blood pressure 107/76, pulse 105, temperature 98.5 F (36.9 C), temperature source Oral, resp. rate 20, height 5\' 3"  (1.6 m), weight 49.442 kg (109 lb), last menstrual period 07/07/2012, SpO2 99.00%.Body mass index is 19.31 kg/(m^2).  General Appearance: Disheveled and Guarded  Eye Contact::  Poor  Speech:  Slow  Volume:  Decreased  Mood:  Depressed and Dysphoric  Affect:  Constricted and Flat  Thought Process:  Disorganized, Irrelevant and Tangential  Orientation:  Negative  Thought Content:  Hallucinations: None, Paranoid Ideation and Rumination  Suicidal Thoughts:  Yes.  without intent/plan  Homicidal Thoughts:  No  Memory:  Difficult to concentrate and unable to recall things  Judgement:  Poor  Insight:  Lacking  Psychomotor Activity:  Decreased  Concentration:  Poor  Recall:  Poor  Akathisia:  No  Handed:  Right  AIMS (if indicated):     Assets:  Housing Social Support  Sleep:  Number of Hours: 6.75   Current Medications: Current Facility-Administered Medications  Medication  Dose Route Frequency Provider Last Rate Last Dose  . alum & mag hydroxide-simeth (MAALOX/MYLANTA) 200-200-20 MG/5ML suspension 30 mL  30 mL Oral Q4H PRN Verne Spurr, PA-C      . benztropine (COGENTIN) tablet 1 mg  1 mg Oral QHS Naftoli Penny      . citalopram (CELEXA) tablet 10 mg  10 mg Oral Daily Ioannis Schuh      . [START ON 07/23/2012] haloperidol (HALDOL) tablet 5 mg  5 mg Oral QHS Wesly Whisenant      . LORazepam (ATIVAN) tablet 1 mg  1 mg Oral Q6H PRN Verne Spurr, PA-C   1 mg at 07/21/12 2139   Or  . LORazepam (ATIVAN) injection 1 mg  1 mg Intramuscular Q6H PRN Verne Spurr, PA-C      . magnesium hydroxide (MILK OF MAGNESIA) suspension 30 mL  30 mL Oral Daily PRN Verne Spurr, PA-C        Lab Results: No results found for this or any previous visit (from the past 48 hour(s)).  Physical Findings: AIMS: Facial and Oral Movements Muscles of Facial Expression: None, normal Lips and Perioral Area: None, normal Jaw: None, normal Tongue: None, normal,Extremity Movements Upper (arms, wrists, hands, fingers): None, normal Lower (legs, knees, ankles, toes): None, normal, Trunk Movements Neck, shoulders, hips: None, normal, Overall Severity Severity of abnormal movements (highest score from questions above): None, normal Incapacitation due to abnormal movements: None, normal Patient's awareness of abnormal movements (  rate only patient's report): No Awareness, Dental Status Current problems with teeth and/or dentures?: No Does patient usually wear dentures?: No  CIWA:  CIWA-Ar Total: 6 COWS:     Treatment Plan Summary: Daily contact with patient to assess and evaluate symptoms and progress in treatment Medication management Plan:  1. Increase Haldol to 5mg  po Qhs for delusions 2. Initiate Celexa 10mg  po daily for depression and anxiety. 3. Initiate Cogentin 1mg  po Qhs for EPS prevention. 4. Will continue other treatment regimen.  Medical Decision Making Problem Points:   Established problem, worsening (2), Review of last therapy session (1) and Review of psycho-social stressors (1) Data Points:  Review or order clinical lab tests (1) Review and summation of old records (2) Review of medication regiment & side effects (2) Review of new medications or change in dosage (2)  I certify that inpatient services furnished can reasonably be expected to improve the patient's condition.   Charly Hunton,MD 07/22/2012, 11:01 AM

## 2012-07-22 NOTE — Progress Notes (Signed)
D: Pt denies SI/HI/AVH. Pt presents with flat affect, depressed mood, and disorganized thoughts. Pt continues to be cautious of others and paranoid. Pt reports feeling less depressed today 5/10. Medications administered as ordered per MD. Medications adjusted per MD. Verbal support given. Pt encouraged to attend groups. 15 minute checks performed for safety. R: Pt remains safe. Pt compliant with taking meds an attending groups.

## 2012-07-22 NOTE — [facility] Group Notes (Signed)
[facility] LCSW Group Therapy  07/22/2012 1:15 pm  Type of Therapy: Process Group Therapy  Participation Level:  Active  Participation Quality:  Appropriate  Affect:  Flat  Cognitive:  Oriented  Insight:  Improving  Engagement in Group:  Limited  Engagement in Therapy:  Limited  Modes of Intervention:  Activity, Clarification, Education, Problem-solving and Support  Summary of Progress/Problems: Today's group addressed the issue of overcoming obstacles.  Patients were asked to identify their biggest obstacle post d/c that stands in the way of their on-going success, and then problem solve as to how to manage this.  Ashley Mosley did not identify a particular barrier for herself, but resonated with what all the other patients were presenting.  For instance, she she talked about difficulty with setting limits with others, feeling panicked at times and wanting to change residence.  This were all initially presented by other patients.  She continues to present as confused, disorganized and paranoid.  Ashley Mosley 07/22/2012   3:39 PM

## 2012-07-22 NOTE — [facility] Group Notes (Signed)
[facility] LCSW Aftercare Discharge Planning Group Note   07/22/2012 8:26 AM  Participation Quality:  Engaged  Mood/Affect:  Flat  Depression Rating:  5  Anxiety Rating:  5  Thoughts of Suicide:  No Will you contract for safety?   NA  Current AVH:  Denies, but presents as vague, guarded and disorganized  Plan for Discharge/Comments:  Ashley Mosley states she has been under intense stress and pressure recently due to legal affairs that she declines to go into.  Also admits that she often does not quite understand exactly what others are telling her to do, and it becomes even worse when she gets conflicting messages about what to do.  Also states "I am carrying a tremendous amount of guilt," but declines to go into detail.  States she is feeling less stressed than when she came in, and asks for referral to a day program.  Transportation Means:  unknown  Supports: unknown   [place], McCammon B

## 2012-07-22 NOTE — Progress Notes (Signed)
Recreation Therapy Notes  Date: 07.14.2014 Time: 9:30am Location: [address] Dayroom      Group Topic/Focus: Leisure Education  Participation Level: Active  Participation Quality: Appropriate and Attentive  Affect: Euthymic to tearful at times  Cognitive: Appropriate   Additional Comments: Activity: Adapted On Deck ; Explanation: Patients were asked to roll a di and choose a leisure/recreation activity from a container. If patient rolled a 1-3 patient was asked to act out the leisure/recreation activity. If patient rolled 4-6 patient was asked to draw leisure recreation activity.   Patient actively participated in group activity. Patient drew out leisure/recreation activity appropriately. Patient was successful at guessing peers actions or drawings. Patient shared she enjoys being outside. Patient shared she enjoys working in her yard and growing flowers. Patient spoke about [place] farm, patient stated she has both good and bad memories from the time she spent on the farm. Patient became tearful at this point. Patient was abel to self-regulate and continue sharing with the group. Patient shared that she enjoys planting flowers because it gives her a sense of "value and self-worth" to plant something and watch it grow.    Marykay Lex Shahiem Bedwell, LRT/CTRS  Qaadir Kent L 07/22/2012 1:14 PM

## 2012-07-22 NOTE — Progress Notes (Signed)
Adult Psychoeducational Group Note  Date:  07/22/2012 Time:  1:56 PM  Group Topic/Focus:  Wellness Toolbox:   The focus of this group is to discuss various aspects of wellness, balancing those aspects and exploring ways to increase the ability to experience wellness.  Patients will create a wellness toolbox for use upon discharge.  Participation Level:  Minimal  Participation Quality:  Attentive  Affect:  Blunted  Cognitive:  Oriented  Insight: Appropriate  Engagement in Group:  Lacking  Modes of Intervention:  Discussion, Education and Support  Additional Comments:  Ashley Mosley was minimally active in group today. She was respectful of all other patients but seemed distracted.   [name] 07/22/2012, 1:56 PM
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
## 2012-07-23 NOTE — Progress Notes (Signed)
Adult Psychoeducational Group Note  Date:  07/23/2012 Time:  10:21 AM  Group Topic/Focus:  Recovery Goals:   The focus of this group is to identify appropriate goals for recovery and establish a plan to achieve them.  Participation Level:  Active  Participation Quality:  Appropriate, Attentive and Supportive  Affect:  Appropriate  Cognitive:  Appropriate  Insight: Appropriate and Good  Engagement in Group:  Engaged and Supportive  Modes of Intervention:  Discussion  Additional Comments:  Pt. Stated that recovery to her involved family involvement as well as acknowledging and accepting that she has some things to work on. To her life would be more balances and involve staying on her meds and seeking therapy. She states that her support will help her achieve theses goals.   Ashley Mosley 07/23/2012, 10:21 AM

## 2012-07-23 NOTE — [facility] Group Notes (Signed)
[facility] LCSW Group Therapy  07/23/2012 , 12:50 PM   Type of Therapy:  Group Therapy  Participation Level:  Active  Participation Quality:  Attentive  Affect:  Appropriate  Cognitive:  Alert  Insight:  Improving  Engagement in Therapy:  Engaged  Modes of Intervention:  Discussion, Exploration and Socialization  Summary of Progress/Problems: Today's group focused on the term Diagnosis.  Participants were asked to define the term, and then pronounce whether it is a negative, positive or neutral term.  Ashley Mosley enjoyed this discussion.  She actually brought up the issue of labels and judgement before I did, and got the rest of the group thinking and talking about how to fight these things, as well as being less judgmental ourselves.  Daryel Gerald B 07/23/2012 , 12:50 PM

## 2012-07-23 NOTE — Progress Notes (Signed)
D: Pt is flat in affect and anxious in mood. Pt reports feeling "dull" after her first dose of Celexa on 07/22/13. Pt continues to contemplate on signing a 72 hour request for discharge. She reports that she rather be discharged to IOP instead of having a large hospital bill. Writer extended the opportunity for this pt to sign the paper, with ramifications explained. Pt verbalized understanding, but remains undecided. Pt is currently denying any SI/HI/AVH. Pt attended group this evening. Pt observed with minimum interaction within the milieu.  A: Writer administered scheduled medications to pt. Continued support and availability as needed was extended to this pt. Staff continue to monitor pt with q50min checks.  R: No adverse drug reactions noted. Pt receptive to treatment. Pt remains safe at this time.

## 2012-07-23 NOTE — Progress Notes (Signed)
Patient ID: Ashley Mosley, female   DOB: Jan 31, 1960, 52 y.o.   MRN: [id number] [name] MD Progress Note  07/23/2012 6:46 PM Ashley Mosley  MRN:  [id number] Subjective:  " I'm better." Objective:   Patient is alert and oriented x3 today rates her depression as a 2/10 and her anxiety as a 4/10. She is much improved and both her cognition and mental status. Patient states she feels ready to tackle her issues upon discharge. Diagnosis:  Axis I: Major Depression, Recurrent severe and Psychotic Disorder NOS                                Anxiety disorder, NOS  ADL's:  Impaired  Sleep: Fair  Appetite:  Fair  Suicidal Ideation:  Plan:  None Intent:  none Homicidal Ideation:  Plan:  none Intent:  none AEB (as evidenced by):  Psychiatric Specialty Exam: Review of Systems  Constitutional: Negative.  Negative for fever, chills, weight loss, malaise/fatigue and diaphoresis.  HENT: Negative for congestion and sore throat.   Eyes: Negative for blurred vision, double vision and photophobia.  Respiratory: Negative for cough, shortness of breath and wheezing.   Cardiovascular: Negative for chest pain, palpitations and PND.  Gastrointestinal: Negative for heartburn, nausea, vomiting, abdominal pain, diarrhea and constipation.  Musculoskeletal: Negative for myalgias, joint pain and falls.  Neurological: Negative for dizziness, tingling, tremors, sensory change, speech change, focal weakness, seizures, loss of consciousness, weakness and headaches.  Endo/Heme/Allergies: Negative for polydipsia. Does not bruise/bleed easily.  Psychiatric/Behavioral: Positive for depression and hallucinations. Negative for suicidal ideas, memory loss and substance abuse. The patient is nervous/anxious and has insomnia.   All other systems reviewed and are negative.    Blood pressure 106/74, pulse 90, temperature 98.3 F (36.8 C), temperature source Oral, resp. rate 18, height 5\' 3"  (1.6 m), weight 49.442 kg (109 lb),  last menstrual period 07/07/2012, SpO2 99.00%.Body mass index is 19.31 kg/(m^2).  General Appearance: Disheveled and Guarded  Eye Contact::  Poor  Speech:  Slow  Volume:  Decreased  Mood:  Depressed and Dysphoric  Affect:  Constricted and Flat  Thought Process:  Disorganized, Irrelevant and Tangential  Orientation:  Negative  Thought Content:  Hallucinations: None, Paranoid Ideation and Rumination  Suicidal Thoughts:  Yes.  without intent/plan  Homicidal Thoughts:  No  Memory:  Difficult to concentrate and unable to recall things  Judgement:  Poor  Insight:  Lacking  Psychomotor Activity:  Decreased  Concentration:  Poor  Recall:  Poor  Akathisia:  No  Handed:  Right  AIMS (if indicated):     Assets:  Housing Social Support  Sleep:  Number of Hours: 6.25   Current Medications: Current Facility-Administered Medications  Medication Dose Route Frequency Provider Last Rate Last Dose  . alum & mag hydroxide-simeth (MAALOX/MYLANTA) 200-200-20 MG/5ML suspension 30 mL  30 mL Oral Q4H PRN Verne Spurr, PA-C      . benztropine (COGENTIN) tablet 1 mg  1 mg Oral QHS Mojeed Akintayo   1 mg at 07/22/12 2136  . citalopram (CELEXA) tablet 10 mg  10 mg Oral Daily Mojeed Akintayo   10 mg at 07/23/12 0759  . haloperidol (HALDOL) tablet 5 mg  5 mg Oral QHS Mojeed Akintayo      . LORazepam (ATIVAN) tablet 1 mg  1 mg Oral Q6H PRN Verne Spurr, PA-C   1 mg at 07/21/12 2139   Or  . LORazepam (ATIVAN) injection 1  mg  1 mg Intramuscular Q6H PRN Verne Spurr, PA-C      . magnesium hydroxide (MILK OF MAGNESIA) suspension 30 mL  30 mL Oral Daily PRN Verne Spurr, PA-C        Lab Results: No results found for this or any previous visit (from the past 48 hour(s)).  Physical Findings: AIMS: Facial and Oral Movements Muscles of Facial Expression: None, normal Lips and Perioral Area: None, normal Jaw: None, normal Tongue: None, normal,Extremity Movements Upper (arms, wrists, hands, fingers): None,  normal Lower (legs, knees, ankles, toes): None, normal, Trunk Movements Neck, shoulders, hips: None, normal, Overall Severity Severity of abnormal movements (highest score from questions above): None, normal Incapacitation due to abnormal movements: None, normal Patient's awareness of abnormal movements (rate only patient's report): No Awareness, Dental Status Current problems with teeth and/or dentures?: No Does patient usually wear dentures?: No  CIWA:  CIWA-Ar Total: 6 COWS:     Treatment Plan Summary: Daily contact with patient to assess and evaluate symptoms and progress in treatment Medication management Plan:  1. Will continue current plan of care with goal of d/c home in AM per MD. 2. Will get TSH tonight due to patient's complaint of chills all the time. 3. Will follow.   Medical Decision Making Problem Points:  Established problem, worsening (2), Review of last therapy session (1) and Review of psycho-social stressors (1) Data Points:  Review or order clinical lab tests (1) Review and summation of old records (2) Review of medication regiment & side effects (2) Review of new medications or change in dosage (2)  I certify that inpatient services furnished can reasonably be expected to improve the patient's condition.  Rona Ravens. Cythia Bachtel RPAC 6:59 PM 07/23/2012

## 2012-07-23 NOTE — Progress Notes (Signed)
D: Pt denies SI/HI/AVH. Pt brighter today on approach. Pt engaged with others on the milieu. Pt reports feeling better today and reports sleeping well last night. Pt do not present with any delusions or disorganized thinking this morning. Pt voiced no concerned about medications. No adverse reactions to meds verbalized by pt to Clinical research associate. A: Medications administered as ordered per MD. Verbal support given. Pt encouraged to attend groups. 15 minute checks performed for safety. R: Pt is receptive to treatment and plans to return to her previous therapist for treatment. Pt remains safe at this time.

## 2012-07-23 NOTE — Progress Notes (Signed)
Adult Psychoeducational Group Note  Date:  07/23/2012 Time:  10:16 PM  Group Topic/Focus:  Wrap-Up Group:   The focus of this group is to help patients review their daily goal of treatment and discuss progress on daily workbooks.  Participation Level:  Active  Participation Quality:  Intrusive  Affect:  Labile  Cognitive:  Disorganized  Insight: Lacking  Engagement in Group:  Engaged  Modes of Intervention:  Support  Additional Comments:  Patient attended and participated in group tonight. She reports having a good day. She felt en-lighten. She may go home tomorrow. He recovery plans are to stay focus. To keep her appointments and stay true to herself.  Lita Mains [facility] 07/23/2012, 10:16 PM

## 2012-07-23 NOTE — [facility] Suicide Risk Assessment (Signed)
[facility] INPATIENT:  Family/Significant Other Suicide Prevention Education  Suicide Prevention Education:  Education Completed; Cyndie Woodbeck, friend, [phone]  has been identified by the patient as the family member/significant other with whom the patient will be residing, and identified as the person(s) who will aid the patient in the event of a mental health crisis (suicidal ideations/suicide attempt).  With written consent from the patient, the family member/significant other has been provided the following suicide prevention education, prior to the and/or following the discharge of the patient.  The suicide prevention education provided includes the following:  Suicide risk factors  Suicide prevention and interventions  National Suicide Hotline telephone number  [facility] assessment telephone number  [facility] Emergency Assistance 911  [facility] and/or Residential Mobile Crisis Unit telephone number  Request made of family/significant other to:  Remove weapons (e.g., guns, rifles, knives), all items previously/currently identified as safety concern.    Remove drugs/medications (over-the-counter, prescriptions, illicit drugs), all items previously/currently identified as a safety concern.  The family member/significant other verbalizes understanding of the suicide prevention education information provided.  The family member/significant other agrees to remove the items of safety concern listed above. Vincenza Hews states there are no guns in the home.   Daryel Gerald B 07/23/2012, 10:53 AM

## 2012-07-23 NOTE — B[facility] Group Notes (Signed)
[facility] LCSW Aftercare Discharge Planning Group Note   07/23/2012 8:17 AM  Participation Quality:  Engaged  Mood/Affect:  Flat  Depression Rating:  denies  Anxiety Rating:  4  Thoughts of Suicide:  No Will you contract for safety?   NA  Current AVH:  No  Plan for Discharge/Comments:  Ashley Mosley is more focused today.  Hoping to d/c soon.  Gave permission to call friend for collateral and to do SPE.  Plans to follow up at [facility] and with her own individual therapist.  Transportation Means: fiance  Supports:  Ashley Mosley, Ashley  Mosley, Ashley Mosley

## 2012-07-23 NOTE — Progress Notes (Signed)
[facility] Adult Case Management Discharge Plan :  Will you be returning to the same living situation after discharge: Yes,  home At discharge, do you have transportation home?:Yes,  family Do you have the ability to pay for your medications:Yes,  mental health  Release of information consent forms completed and in the chart;  Patient's signature needed at discharge.  Patient to Follow up at: Follow-up Information   Follow up with [facility]. (Go to the walk-in clinic M-F between 8 and 9AM for your hospital follow up appointment.  This is where you will see a psychiatrist for your meds)    Contact information:   [address]  [place]  [336] [phone]        Follow up with [facility] On 07/29/2012. (11:00 with Lorelee Market)    Contact information:   [address]  [place]  [336] [phone]      Patient denies SI/HI:   Yes,  yes    Safety Planning and Suicide Prevention discussed:  Yes,  yes  Ida Rogue 07/23/2012, 5:36 PM

## 2012-07-24 LAB — TSH: TSH: 0.323 u[IU]/mL — ABNORMAL LOW (ref 0.350–4.500)

## 2012-07-24 MED ORDER — CITALOPRAM HYDROBROMIDE 10 MG PO TABS: 10.0000 mg | ORAL_TABLET | Freq: Every day | ORAL | Status: DC

## 2012-07-24 MED ORDER — BENZTROPINE MESYLATE 1 MG PO TABS: 1.0000 mg | ORAL_TABLET | Freq: Every day | ORAL | Status: DC

## 2012-07-24 MED ORDER — HALOPERIDOL 5 MG PO TABS: 5.0000 mg | ORAL_TABLET | Freq: Every day | ORAL | Status: DC

## 2012-07-24 NOTE — Discharge Summary (Signed)
Physician Discharge Summary Note  Patient:  Ashley Mosley is an 52 y.o., female MRN:  [id number] DOB:  [birth year] Patient phone:  [phone] (home)  Patient address:   [address] [place] [address],   Date of Admission:  07/19/2012 Date of Discharge: 07/24/2012  Reason for Admission:  Paranoia  Discharge Diagnoses: Principal Problem:   Major depressive disorder, single episode, severe, specified as with psychotic behavior Active Problems:   Anxiety state, unspecified   Paranoid schizophrenia  Review of Systems  Constitutional: Negative.  Negative for fever, chills, weight loss, malaise/fatigue and diaphoresis.  HENT: Negative for congestion and sore throat.   Eyes: Negative for blurred vision, double vision and photophobia.  Respiratory: Negative for cough, shortness of breath and wheezing.   Cardiovascular: Negative for chest pain, palpitations and PND.  Gastrointestinal: Negative for heartburn, nausea, vomiting, abdominal pain, diarrhea and constipation.  Musculoskeletal: Negative for myalgias, joint pain and falls.  Neurological: Negative for dizziness, tingling, tremors, sensory change, speech change, focal weakness, seizures, loss of consciousness, weakness and headaches.  Endo/Heme/Allergies: Negative for polydipsia. Does not bruise/bleed easily.  Psychiatric/Behavioral: Negative for depression, suicidal ideas, hallucinations, memory loss and substance abuse. The patient is not nervous/anxious and does not have insomnia.    Discharge Diagnoses:  AXIS I: Major depressive disorder, single episode, severe, specified as with psychotic behavior  AXIS II: Deferred  AXIS III: History reviewed. No pertinent past medical history.  AXIS IV: other psychosocial or environmental problems and problems related to social environment  AXIS V: 61-70 mild symptoms  Level of Care:  OP  Hospital Course:  Ashley Mosley was admitted voluntarily after presenting to the  emergency room where she was brought in by family member. She was agitated and acting paranoid. She was also observed taking medication in the emergency room bathroom.      The patient was unable to answer questions coherently in the emergency room, and stated that she was having depression, difficulty sleeping, with increased panic attacks. There was some question of a lawsuit that the patient was involved in, and she noted that FBI was out to get her. She was given medical clearance and transferred to behavioral health for further evaluation and stabilization.      Her labs were unremarkable with the exception of a minimally elevated white count, a UDS that was positive for opiates.      Upon arrival at the unit the patient was evaluated and found to be extremely paranoid, disorganized, thought blocking, labile and tearful, delusional, fearing that she had caused the 911 bombing by failing to disclose information that she had.       She agreed to take medication which was started and she was encouraged to participate in unit programming. Within 36-48 hours the patient's emotional and mental status was much improved. Her disorganization was improving, and the delusional state she was in began to resolve. She reported no side effects to the medication and did report improved sleep. Due to her reports of being chilled in what was felt to be mild exophthalmos, a TSH was drawn.        Her results came back prior to her discharge and were noted to be slightly low at 0.323. The patient improved rapidly and was felt to be ready for discharge as she denied suicidal or homicidal ideation, her speech was clear and goal directed and her thought process had improved significantly. She was alert and oriented x3 and is requesting to go home to  be with her husband on his birthday.        As the patient was in much improved condition and felt to be stable for discharge she was discharged home with a plan to followup as noted  below. She was informed of her TSH results and encouraged to followup with her primary care provider for further testing. Feeling that this admission to be have been beneficial for her and was grateful for the time and professionalism shown to her by our staff. Consults:  None  Significant Diagnostic Studies:  None  Discharge Vitals:   Blood pressure 101/70, pulse 90, temperature 98.5 F (36.9 C), temperature source Oral, resp. rate 16, height 5\' 3"  (1.6 m), weight 49.442 kg (109 lb), last menstrual period 07/07/2012, SpO2 99.00%. Body mass index is 19.31 kg/(m^2). Lab Results:   Results for orders placed during the hospital encounter of 07/19/12 (from the past 72 hour(s))  TSH     Status: Abnormal   Collection Time    07/23/12  7:42 PM      Result Value Range   TSH 0.323 (*) 0.350 - 4.500 uIU/mL    Physical Findings: AIMS: Facial and Oral Movements Muscles of Facial Expression: None, normal Lips and Perioral Area: None, normal Jaw: None, normal Tongue: None, normal,Extremity Movements Upper (arms, wrists, hands, fingers): None, normal Lower (legs, knees, ankles, toes): None, normal, Trunk Movements Neck, shoulders, hips: None, normal, Overall Severity Severity of abnormal movements (highest score from questions above): None, normal Incapacitation due to abnormal movements: None, normal Patient's awareness of abnormal movements (rate only patient's report): No Awareness, Dental Status Current problems with teeth and/or dentures?: No Does patient usually wear dentures?: No  CIWA:  CIWA-Ar Total: 6 COWS:     Psychiatric Specialty Exam: See Psychiatric Specialty Exam and Suicide Risk Assessment completed by Attending Physician prior to discharge.  Discharge destination:  Home  Is patient on multiple antipsychotic therapies at discharge:  No   Has Patient had three or more failed trials of antipsychotic monotherapy by history:  No  Recommended Plan for Multiple Antipsychotic  Therapies: Not applicable   Discharge Orders   Future Orders Complete By Expires     Diet - low sodium heart healthy  As directed     Discharge instructions  As directed     Comments:      Take all of your medications as directed. Be sure to keep all of your follow up appointments.  If you are unable to keep your follow up appointment, call your Doctor's office to let them know, and reschedule.  Make sure that you have enough medication to last until your appointment. Be sure to get plenty of rest. Going to bed at the same time each night will help. Try to avoid sleeping during the day.  Increase your activity as tolerated. Regular exercise will help you to sleep better and improve your mental health. Eating a heart healthy diet is recommended. Try to avoid salty or fried foods. Be sure to avoid all alcohol and illegal drugs.    Increase activity slowly  As directed         Medication List       Indication   benztropine 1 MG tablet  Commonly known as:  COGENTIN  Take 1 tablet (1 mg total) by mouth at bedtime.   Indication:  Extrapyramidal Reaction caused by Medications     citalopram 10 MG tablet  Commonly known as:  CELEXA  Take 1 tablet (10 mg  total) by mouth daily.   Indication:  Depression     haloperidol 5 MG tablet  Commonly known as:  HALDOL  Take 1 tablet (5 mg total) by mouth at bedtime.   Indication:  Psychosis           Follow-up Information   Follow up with [facility]. (Go to the walk-in clinic M-F between 8 and 9AM for your hospital follow up appointment.  This is where you will see a psychiatrist for your meds)    Contact information:   [address]  [place]  [336] [phone]        Follow up with [facility] On 07/29/2012. (11:00 with Lorelee Market)    Contact information:   [address]  [place]  [336] [phone]      Follow-up recommendations:   Activities: Resume activity as tolerated. Diet: Heart healthy low sodium diet Tests: Your  TSH is LOW!!! You will need follow up testing with your PCP. Please call and schedule and appointment to include bloodwork.  Comments:    Total Discharge Time:  Greater than 30 minutes.  Signed: Rona Ravens. Ski Polich RPAC 9:43 AM 07/24/2012

## 2012-07-24 NOTE — [facility] Suicide Risk Assessment (Signed)
Suicide Risk Assessment  Discharge Assessment     Demographic Factors:  Low socioeconomic status, Unemployed and female  Mental Status Per Nursing Assessment::   On Admission:  Suicidal ideation indicated by others  Current Mental Status by Physician: patient denies suicidal ideation, intent or plan  Loss Factors: Decrease in vocational status and Financial problems/change in socioeconomic status  Historical Factors: NA  Risk Reduction Factors:   Sense of responsibility to family, Living with another person, especially a relative and Positive social support  Continued Clinical Symptoms:  Resolving anxiety and delusional symptoms.  Cognitive Features That Contribute To Risk:  Closed-mindedness    Suicide Risk:  Minimal: No identifiable suicidal ideation.  Patients presenting with no risk factors but with morbid ruminations; may be classified as minimal risk based on the severity of the depressive symptoms  Discharge Diagnoses:   AXIS I:  Major depressive disorder, single episode, severe, specified as with psychotic behavior  AXIS II:  Deferred AXIS III:  History reviewed. No pertinent past medical history. AXIS IV:  other psychosocial or environmental problems and problems related to social environment AXIS V:  61-70 mild symptoms  Plan Of Care/Follow-up recommendations:  Activity:  as tolerated Diet:  healthy Tests:  routine Other:  patient to keep her after care appointment  Is patient on multiple antipsychotic therapies at discharge:  No   Has Patient had three or more failed trials of antipsychotic monotherapy by history:  No  Recommended Plan for Multiple Antipsychotic Therapies: N/A  Ashley Navedo,MD 07/24/2012, 10:30 AM

## 2012-07-24 NOTE — Tx Team (Signed)
  Interdisciplinary Treatment Plan Update   Date Reviewed:  07/24/2012  Time Reviewed:  10:40 AM  Progress in Treatment:   Attending groups: Yes Participating in groups: Yes Taking medication as prescribed: Yes  Tolerating medication: Yes Family/Significant other contact made: Yes  Patient understands diagnosis: Yes  Discussing patient identified problems/goals with staff: Yes Medical problems stabilized or resolved: Yes Denies suicidal/homicidal ideation: Yes Patient has not harmed self or others: Yes  For review of initial/current patient goals, please see plan of care.  Estimated Length of Stay:  D/C today  Reason for Continuation of Hospitalization:   New Problems/Goals identified:  N/A  Discharge Plan or Barriers:   return home, follow up outpt  Additional Comments:  Attendees:  Signature: Thedore Mins, MD 07/24/2012 10:40 AM   Signature: Richelle Ito, LCSW 07/24/2012 10:40 AM  Signature: Verne Spurr, PA 07/24/2012 10:40 AM  Signature:  07/24/2012 10:40 AM  Signature: Liborio Nixon, RN 07/24/2012 10:40 AM  Signature:  07/24/2012 10:40 AM  Signature:   07/24/2012 10:40 AM  Signature:    Signature:    Signature:    Signature:    Signature:    Signature:      Scribe for Treatment Team:   Richelle Ito, LCSW  07/24/2012 10:40 AM

## 2012-07-24 NOTE — Progress Notes (Signed)
Recreation Therapy Notes  Date: 07.16.2014 Time: 9:30am Location: [address] Day Room      Group Topic/Focus: Memory  Participation Level: Active  Participation Quality: Appropriate  Affect: Euthymic to bright & excited at times  Cognitive: Oriented  Additional Comments: Activity: Memory Sequence ; Explanation: Patients with LRT and nursing student created a sequence of actions to be completed by each member of the group. The sequence started with one action, after traveling around the circle group members were asked to add an action to the sequence.   As session was starting patient was informed by LCSW she would be d/c today. Patient became very excited and smiled brightly. Patient maintained same level of positivity throughout group session. Patient actively participated in group activity. Patient was able to repeat sequence with no assistance from LRT.    Marykay Lex Prezley Qadir, LRT/CTRS  Jearl Klinefelter 07/24/2012 12:31 PM

## 2012-07-24 NOTE — Progress Notes (Signed)
Seen and agreed. Olive Motyka, MD 

## 2012-07-24 NOTE — Progress Notes (Signed)
Pt observed in the dayroom watching TV.  She reports she feels much better and that her thoughts are more clear.  She had questions about her meds which were addressed.  She denies SI/HI/AV at this time. She feels ready to discharge home, which may take place tomorrow.  Pt makes her needs known to staff. Support and encouragement offered.  Safety maintained with q15 minute checks.

## 2012-07-24 NOTE — Progress Notes (Signed)
Pt discharged per MD orders; pt currently denies SI/HI and auditory/visual hallucinations; pt was given education by RN regarding follow-up appointments and medications and pt denied any questions or concerns about these instructions; pt was then escorted to search room to retrieve her belongings by Alvis Lemmings, RN before being discharged to hospital lobby

## 2012-07-29 NOTE — Progress Notes (Signed)
Patient Discharge Instructions:  After Visit Summary (AVS):   Faxed to:  07/29/12 Psychiatric Admission Assessment Note:   Faxed to:  07/29/12 Suicide Risk Assessment - Discharge Assessment:   Faxed to:  07/29/12 Faxed/Sent to the Next Level Care provider:  07/29/12 Faxed to [facility] @ [phone] Faxed to [facility] @ [phone]  Jerelene Redden, 07/29/2012, 3:48 PM

## 2012-07-31 NOTE — Discharge Summary (Signed)
Seen and agreed. Arie Powell, MD 

## 2012-10-21 ENCOUNTER — Telehealth: Payer: Self-pay | Admitting: Obstetrics and Gynecology

## 2012-10-21 NOTE — Telephone Encounter (Signed)
Patient is concerned about a lump on the inside of of her vagina. She has a new pt appt on wed with dr Edward Jolly. Didn't know if she needed a different appt for it or not. Call her after 2

## 2012-10-22 NOTE — Telephone Encounter (Signed)
LM on pt's VM that keeping her appt tomorrow will be sufficient. Pt can call back with any questions.

## 2012-10-23 ENCOUNTER — Other Ambulatory Visit: Payer: Self-pay | Admitting: Obstetrics and Gynecology

## 2012-10-23 ENCOUNTER — Encounter: Payer: Self-pay | Admitting: Obstetrics and Gynecology

## 2012-10-23 ENCOUNTER — Ambulatory Visit (INDEPENDENT_AMBULATORY_CARE_PROVIDER_SITE_OTHER): Payer: Self-pay | Admitting: Obstetrics and Gynecology

## 2012-10-23 VITALS — BP 118/70 | HR 78 | Resp 16 | Ht 63.5 in | Wt 120.0 lb

## 2012-10-23 DIAGNOSIS — E538 Deficiency of other specified B group vitamins: Secondary | ICD-10-CM

## 2012-10-23 DIAGNOSIS — N921 Excessive and frequent menstruation with irregular cycle: Secondary | ICD-10-CM

## 2012-10-23 DIAGNOSIS — Z01419 Encounter for gynecological examination (general) (routine) without abnormal findings: Secondary | ICD-10-CM

## 2012-10-23 DIAGNOSIS — N920 Excessive and frequent menstruation with regular cycle: Secondary | ICD-10-CM

## 2012-10-23 DIAGNOSIS — N760 Acute vaginitis: Secondary | ICD-10-CM

## 2012-10-23 DIAGNOSIS — Z0000 Encounter for general adult medical examination without abnormal findings: Secondary | ICD-10-CM

## 2012-10-23 DIAGNOSIS — N6320 Unspecified lump in the left breast, unspecified quadrant: Secondary | ICD-10-CM

## 2012-10-23 DIAGNOSIS — N63 Unspecified lump in unspecified breast: Secondary | ICD-10-CM

## 2012-10-23 DIAGNOSIS — A499 Bacterial infection, unspecified: Secondary | ICD-10-CM

## 2012-10-23 DIAGNOSIS — R921 Mammographic calcification found on diagnostic imaging of breast: Secondary | ICD-10-CM

## 2012-10-23 DIAGNOSIS — R928 Other abnormal and inconclusive findings on diagnostic imaging of breast: Secondary | ICD-10-CM

## 2012-10-23 LAB — POCT URINALYSIS DIPSTICK
Blood, UA: NEGATIVE
Glucose, UA: NEGATIVE
Nitrite, UA: NEGATIVE
Protein, UA: NEGATIVE
Urobilinogen, UA: NEGATIVE
pH, UA: 7
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
## 2012-10-23 NOTE — Patient Instructions (Signed)

## 2012-10-24 LAB — VITAMIN B12: Vitamin B-12: 304 pg/mL (ref 211–911)

## 2012-10-24 LAB — THYROID PANEL WITH TSH
T4, Total: 8.6 ug/dL (ref 5.0–12.5)
TSH: 0.797 u[IU]/mL (ref 0.350–4.500)

## 2012-10-25 ENCOUNTER — Telehealth: Payer: Self-pay | Admitting: Obstetrics and Gynecology

## 2012-10-25 DIAGNOSIS — Z9889 Other specified postprocedural states: Secondary | ICD-10-CM

## 2012-10-25 DIAGNOSIS — N921 Excessive and frequent menstruation with irregular cycle: Secondary | ICD-10-CM

## 2012-10-25 LAB — IPS PAP TEST WITH HPV

## 2012-10-25 NOTE — Telephone Encounter (Signed)
Spoke with patient in regards to her benefits and shgm/endo bx. Patient explained that her husband has coverage for himself and her through [employer] and has been paying for this insurance since July. She explained that her husband talked to [facility] and they confirm that they do not have any documentation that they are covered. Jeannette Corpus has confirmed they are covered and said it will take between 2-5 days to have the information processed and updated for [employer]. Patient asked if I would be willing to touch base with [employer] on Monday to see if their status had changed. I will recheck on Monday.

## 2012-10-29 NOTE — Telephone Encounter (Signed)
LMTCB to talk with patient about coverage. I called [employer] again today and patient is still not listed as a covered member. [employer] said it is up to the employer to turn in all required documentation.

## 2012-10-29 NOTE — Telephone Encounter (Signed)
Pt has started bleeding

## 2012-10-30 NOTE — Telephone Encounter (Signed)
Ashley Mosley,  Perhaps this patient could benefit from a referral to the Clinic at [facility] for care at a lesser cost?  It sounds like the cost through our office could be prohibitive.  Thank you in advance for reaching out to the patient to discuss this with her.   [employer]

## 2012-10-30 NOTE — Telephone Encounter (Signed)
Ashley Mosley, lets go ahead and schedule an appointment for her with the [facility] since those appointments take several weeks to get.  If she has insurance issues resolved ny then, we can cancel it.  Will enter order.

## 2012-10-30 NOTE — Telephone Encounter (Signed)
Thanks for the update

## 2012-10-30 NOTE — Telephone Encounter (Signed)
Patient is scheduled for 10/30 @ 9am. Her husband has filed for [employer] through his past employer. They are having difficulty with the employer to have everything submitted for them to become active. She wants to have procedure one way or another. She wanted to schedule and will pay OOP if the insurance has not kicked in by 10/30. If you think referring her to the clinic would be a better option that is fine too.

## 2012-10-30 NOTE — Telephone Encounter (Signed)
Call to patient, LMTCB

## 2012-10-30 NOTE — Telephone Encounter (Signed)
Patient returning Carolynn's call. °

## 2012-11-06 ENCOUNTER — Other Ambulatory Visit: Payer: Self-pay

## 2012-11-06 ENCOUNTER — Other Ambulatory Visit: Payer: Self-pay | Admitting: Obstetrics and Gynecology

## 2012-11-06 DIAGNOSIS — N6320 Unspecified lump in the left breast, unspecified quadrant: Secondary | ICD-10-CM

## 2012-11-06 DIAGNOSIS — R921 Mammographic calcification found on diagnostic imaging of breast: Secondary | ICD-10-CM

## 2012-11-07 ENCOUNTER — Encounter: Payer: Self-pay | Admitting: Obstetrics and Gynecology

## 2012-11-07 ENCOUNTER — Other Ambulatory Visit: Payer: Self-pay | Admitting: Obstetrics and Gynecology

## 2012-11-07 ENCOUNTER — Ambulatory Visit (INDEPENDENT_AMBULATORY_CARE_PROVIDER_SITE_OTHER): Payer: Self-pay | Admitting: Obstetrics and Gynecology

## 2012-11-07 ENCOUNTER — Ambulatory Visit (INDEPENDENT_AMBULATORY_CARE_PROVIDER_SITE_OTHER): Payer: Self-pay

## 2012-11-07 VITALS — BP 110/76 | HR 76 | Ht 63.5 in | Wt 117.5 lb

## 2012-11-07 DIAGNOSIS — N9489 Other specified conditions associated with female genital organs and menstrual cycle: Secondary | ICD-10-CM

## 2012-11-07 DIAGNOSIS — N920 Excessive and frequent menstruation with regular cycle: Secondary | ICD-10-CM

## 2012-11-07 DIAGNOSIS — R140 Abdominal distension (gaseous): Secondary | ICD-10-CM

## 2012-11-07 DIAGNOSIS — D473 Essential (hemorrhagic) thrombocythemia: Secondary | ICD-10-CM

## 2012-11-07 DIAGNOSIS — D75839 Thrombocytosis, unspecified: Secondary | ICD-10-CM

## 2012-11-07 DIAGNOSIS — D259 Leiomyoma of uterus, unspecified: Secondary | ICD-10-CM

## 2012-11-07 DIAGNOSIS — R5381 Other malaise: Secondary | ICD-10-CM

## 2012-11-07 DIAGNOSIS — N921 Excessive and frequent menstruation with irregular cycle: Secondary | ICD-10-CM

## 2012-11-07 DIAGNOSIS — R141 Gas pain: Secondary | ICD-10-CM

## 2012-11-07 DIAGNOSIS — D72829 Elevated white blood cell count, unspecified: Secondary | ICD-10-CM

## 2012-11-07 DIAGNOSIS — R110 Nausea: Secondary | ICD-10-CM

## 2012-11-07 HISTORY — DX: Leiomyoma of uterus, unspecified: D25.9

## 2012-11-07 HISTORY — DX: Other specified conditions associated with female genital organs and menstrual cycle: N94.89

## 2012-11-07 LAB — COMPREHENSIVE METABOLIC PANEL
ALT: 12 U/L (ref 0–35)
AST: 23 U/L (ref 0–37)
Albumin: 4.5 g/dL (ref 3.5–5.2)
Alkaline Phosphatase: 61 U/L (ref 39–117)
BUN: 8 mg/dL (ref 6–23)
Calcium: 9.9 mg/dL (ref 8.4–10.5)
Chloride: 104 mEq/L (ref 96–112)
Creat: 0.64 mg/dL (ref 0.50–1.10)
Potassium: 5 mEq/L (ref 3.5–5.3)

## 2012-11-07 LAB — CBC
HCT: 42.8 % (ref 36.0–46.0)
Hemoglobin: 14.8 g/dL (ref 12.0–15.0)
MCH: 32.5 pg (ref 26.0–34.0)
MCHC: 34.6 g/dL (ref 30.0–36.0)
RDW: 12.9 % (ref 11.5–15.5)

## 2012-11-07 NOTE — Telephone Encounter (Signed)
Please have patient come at 3:00 pm on 11/14/12.  Thank you!

## 2012-11-07 NOTE — Telephone Encounter (Signed)
Patient due for a 7-8 day from today "talking visit" with Dr. Edward Jolly and there are currently no available slots to offer her. Do you have any suggestions? Please let me know and I will call the patient.

## 2012-11-07 NOTE — Progress Notes (Signed)
Appointment scheduled with Dr Loreta Ave for Tuesday 11-12-12 at 330pm.

## 2012-11-07 NOTE — Progress Notes (Addendum)
Subjective  Not feeling well. Bloated.   Dizziness.  Has diagnostic bilateral mammogram and ultrasound tomorrow.  Has calcifications of the right breast and a mass of the left breast at 2:00.  Prefers to avoid hysterectomy.   Just married this summer.  Objective  See ultrasound below - uterus with 2.7 cm right lateral fibroid.  Echogenic thickened endometrium 18 mm. Ovaries normal. Essure noted bilaterally.  No free fluid.     Sonohysterogram - Consent for procedure.  Speculum placed in vagina.  Sterile prep of cervix with betadine.  Cannula placed without difficulty.  Speculum removed.  Saline injected.  Two filling defects noted:  3 cm and 1.1 cm.  Minimal EBL  No complications.  Endometrial biopsy - Consent for procedure.  Speculum placed in vagina.  Sterile prep of cervix with betadine.  Pipelle passed twice.  Tissue to pathology.  Minimal EBL.  No complications.  Assessment  Menometrorrhagia. Uterine fibroid - intramural. Endometrial polyps. Status post Essure.  Abdominal bloating.  Right breast calcifications and left breast mass. Malaise.  Plan  Follow up endometrial biopsy. Discussion with the patient regarding fibroid and polyps.  Natural history of fibroids discussed.  Treatment discussed including hysteroscopy with polypectomy, dilation and curettage vs. Hysterectomy.  I had a preliminary discussion with the patient regarding these options and the differences between them.  I have also given the patient ACOG handouts about hysteroscopy, dilation and curettage, and hysterectomy for her to read further.  Patient will follow up in 8 days for a talking visit. Referral to Dr. Loreta Ave regarding GI symptoms and colonoscopy for colon cancer screening.  Bilateral diagnostic mammogram and breast ultrasounds tomorrow at the [facility].  CBC, CMP.

## 2012-11-08 ENCOUNTER — Ambulatory Visit
Admission: RE | Admit: 2012-11-08 | Discharge: 2012-11-08 | Disposition: A | Discharge status: Home / Self Care | Payer: Self-pay | Source: Ambulatory Visit | Attending: Obstetrics and Gynecology | Admitting: Obstetrics and Gynecology

## 2012-11-08 ENCOUNTER — Other Ambulatory Visit: Payer: Self-pay | Admitting: Obstetrics and Gynecology

## 2012-11-08 DIAGNOSIS — R921 Mammographic calcification found on diagnostic imaging of breast: Secondary | ICD-10-CM

## 2012-11-08 DIAGNOSIS — N6320 Unspecified lump in the left breast, unspecified quadrant: Secondary | ICD-10-CM

## 2012-11-08 NOTE — Telephone Encounter (Signed)
How about 11/15/12 at 3:30?

## 2012-11-08 NOTE — Telephone Encounter (Signed)
This slot was already taken by Carolynn, any other suggestions?

## 2012-11-12 NOTE — Telephone Encounter (Signed)
Phone call to patient to discuss endometrial biopsy:  Benign proliferative endometrium with no hyperplasia or malignancy.   Patient saw GI today and will have an upper GI and a colonoscopy.   Mammogram and breast ultrasound showed bilateral calcifications and left breast cysts, and patient will have a repeat diagnostic mammogram in 6 months.  I will also do a recheck of her breast at her preop visit to determine if I think she needs to see a general surgeon.   WBC slightly elevated.  Platelets also elevated.  These will be rechecked prior to surgery.  Patient will come to the office on Friday to discuss the endometrial biopsy further and plan for a hysteroscopy with dilation and curettage.

## 2012-11-14 ENCOUNTER — Telehealth: Payer: Self-pay

## 2012-11-14 NOTE — Telephone Encounter (Signed)
Patient notified of lab results

## 2012-11-14 NOTE — Telephone Encounter (Signed)
Called pt. LMOVM to call for lab results. 

## 2012-11-14 NOTE — Telephone Encounter (Signed)
Message copied by Alphonsa Overall on Thu Nov 14, 2012  9:35 AM ------      Message from: Alisa Graff      Created: Thu Nov 07, 2012 11:21 AM      Regarding: labs done today       Need to fax results to Dr Kenna Gilbert office for appt Tuesday ------

## 2012-11-14 NOTE — Telephone Encounter (Signed)
Message copied by Alphonsa Overall on Thu Nov 14, 2012  9:56 AM ------      Message from: Conley Simmonds      Created: Thu Nov 07, 2012  8:40 PM       Okey Regal,            Please inform patient of results.  WBC and platelet count are elevated.  Both are nonspecific findings at their current levels.            I recommend a recheck at time of preop visit. ------

## 2012-11-14 NOTE — Telephone Encounter (Signed)
Returning a call to Amanda.

## 2012-11-14 NOTE — Telephone Encounter (Signed)
Faxed labs from 11-07-12 to Dr. Loreta Ave per Dr. Rica Records request.

## 2012-11-15 ENCOUNTER — Ambulatory Visit (INDEPENDENT_AMBULATORY_CARE_PROVIDER_SITE_OTHER): Payer: BC Managed Care – PPO | Admitting: Obstetrics and Gynecology

## 2012-11-15 ENCOUNTER — Encounter: Payer: Self-pay | Admitting: Obstetrics and Gynecology

## 2012-11-15 VITALS — BP 122/80 | HR 76 | Ht 63.5 in | Wt 118.5 lb

## 2012-11-15 DIAGNOSIS — N920 Excessive and frequent menstruation with regular cycle: Secondary | ICD-10-CM

## 2012-11-15 DIAGNOSIS — R7309 Other abnormal glucose: Secondary | ICD-10-CM

## 2012-11-15 DIAGNOSIS — N921 Excessive and frequent menstruation with irregular cycle: Secondary | ICD-10-CM

## 2012-11-15 DIAGNOSIS — N9489 Other specified conditions associated with female genital organs and menstrual cycle: Secondary | ICD-10-CM

## 2012-11-15 DIAGNOSIS — R739 Hyperglycemia, unspecified: Secondary | ICD-10-CM

## 2012-11-15 LAB — HEMOGLOBIN A1C
Hgb A1c MFr Bld: 5.8 % — ABNORMAL HIGH (ref <5.7)
Mean Plasma Glucose: 120 mg/dL — ABNORMAL HIGH (ref <117)

## 2012-11-15 NOTE — Patient Instructions (Signed)
Dilation and Curettage or Vacuum Curettage Dilation and curettage (D&C) and vacuum curettage are minor procedures. A D&C involves stretching (dilation) the cervix and scraping (curettage) the inside lining of the womb (uterus). During a D&C, tissue is gently scraped from the inside lining of the uterus. During a vacuum curettage, the lining and tissue in the uterus are removed with the use of gentle suction.  Curettage may be performed to either diagnose or treat a problem. As a diagnostic procedure, curettage is performed to examine tissues from the uterus. A diagnostic curettage may be performed for the following symptoms:  Irregular bleeding in the uterus.  Bleeding with the development of clots.  Spotting between menstrual periods.  Prolonged menstrual periods.  Bleeding after menopause.  No menstrual period (amenorrhea).  A change in size and shape of the uterus.  As a treatment procedure, curettage may be performed for the following reasons:  Removal of an IUD (intrauterine device).  Removal of retained placenta after giving birth. Retained placenta can cause an infection or bleeding severe enough to require transfusions.  Abortion.  Miscarriage.  Removal of polyps inside the uterus.  Removal of uncommon types of noncancerous lumps (fibroids).  LET [facility] CARE PROVIDER KNOW ABOUT:  Any allergies you have.  All medicines you are taking, including vitamins, herbs, eye drops, creams, and over-the-counter medicines.  Previous problems you or members of your family have had with the use of anesthetics.  Any blood disorders you have.  Previous surgeries you have had.  Medical conditions you have. RISKS AND COMPLICATIONS  Generally, this is a safe procedure. However, as with any procedure, complications can occur. Possible complications include: Excessive bleeding.  Infection of the uterus.  Damage to the cervix.  Development of scar tissue (adhesions) inside the  uterus, later causing abnormal amounts of menstrual bleeding.  Complications from the general anesthetic, if a general anesthetic is used.  Putting a hole (perforation) in the uterus. This is rare.  BEFORE THE PROCEDURE  Eat and drink before the procedure only as directed by your health care provider.  Arrange for someone to take you home.  PROCEDURE  This procedure usually takes about 15 30 minutes. You will be given one of the following: A medicine that numbs the area in and around the cervix (local anesthetic).  A medicine to make you sleep through the procedure (general anesthetic). You will lie on your back with your legs in stirrups.  A warm metal or plastic instrument (speculum) will be placed in your vagina to keep it open and to allow the health care provider to see the cervix. There are two ways in which your cervix can be softened and dilated. These include:  Taking a medicine.  Having thin rods (laminaria) inserted into your cervix.  A curved tool (curette) will be used to scrape cells from the inside lining of the uterus. In some cases, gentle suction is applied with the curette. The curette will then be removed.  AFTER THE PROCEDURE  You will rest in the recovery area until you are stable and are ready to go home.  You may feel sick to your stomach (nauseous) or throw up (vomit) if you were given a general anesthetic.  You may have a sore throat if a tube was placed in your throat during general anesthesia.  You may have light cramping and bleeding. This may last for 2 days to 2 weeks after the procedure.  Your uterus needs to make a new lining after  the procedure. This may make your next period late. Document Released: 12/26/2004 Document Revised: 08/28/2012 Document Reviewed: 07/25/2012 [facility] Patient Information 2014 [place], [place]. Hysteroscopy Hysteroscopy is a procedure used for looking inside the womb (uterus). It may be done for many different  reasons, including:  To evaluate abnormal bleeding, fibroid (benign, noncancerous) tumors, polyps, scar tissue (adhesions), and possibly cancer of the uterus.  To look for lumps (tumors) and other uterine growths.  To look for causes of why a woman cannot get pregnant (infertility), causes of recurrent loss of pregnancy (miscarriages), or a lost intrauterine device (IUD).  To perform a sterilization by blocking the fallopian tubes from inside the uterus. A hysteroscopy should be done right after a menstrual period to be sure you are not pregnant. LET YOUR CAREGIVER KNOW ABOUT:   Allergies.  Medicines taken, including herbs, eyedrops, over-the-counter medicines, and creams.  Use of steroids (by mouth or creams).  Previous problems with anesthetics or numbing medicines.  History of bleeding or blood problems.  History of blood clots.  Possibility of pregnancy, if this applies.  Previous surgery.  Other health problems. RISKS AND COMPLICATIONS   Putting a hole in the uterus.  Excessive bleeding.  Infection.  Damage to the cervix.  Injury to other organs.  Allergic reaction to medicines.  Too much fluid used in the uterus for the procedure. BEFORE THE PROCEDURE   Do not take aspirin or blood thinners for a week before the procedure, or as directed. It can cause bleeding.  Arrive at least 60 minutes before the procedure or as directed to read and sign the necessary forms.  Arrange for someone to take you home after the procedure.  If you smoke, do not smoke for 2 weeks before the procedure. PROCEDURE   Your caregiver may give you medicine to relax you. He or she may also give you a medicine that numbs the area around the cervix (local anesthetic) or a medicine that makes you sleep (general anesthesia).  Sometimes, a medicine is placed in the cervix the day before the procedure. This medicine makes the cervix have a larger opening (dilate). This makes it easier for  the instrument to be inserted into the uterus.  A small instrument (hysteroscope) is inserted through the vagina into the uterus. This instrument is similar to a pencil-sized telescope with a light.  During the procedure, air or a liquid is put into the uterus, which allows the surgeon to see better.  Sometimes, tissue is gently scraped from inside the uterus. These tissue samples are sent to a specialist who looks at tissue samples (pathologist). The pathologist will give a report to your caregiver. This will help your caregiver decide if further treatment is necessary. The report will also help your caregiver decide on the best treatment if the test comes back abnormal. AFTER THE PROCEDURE   If you had a general anesthetic, you may be groggy for a couple hours after the procedure.  If you had a local anesthetic, you will be advised to rest at the surgical center or caregiver's office until you are stable and feel ready to go home.  You may have some cramping for a couple days.  You may have bleeding, which varies from light spotting for a few days to menstrual-like bleeding for up to 3 to 7 days. This is normal.  Have someone take you home. FINDING OUT THE RESULTS OF YOUR TEST Not all test results are available during your visit. If your test results  are not back during the visit, make an appointment with your caregiver to find out the results. Do not assume everything is normal if you have not heard from your caregiver or the medical facility. It is important for you to follow up on all of your test results. HOME CARE INSTRUCTIONS   Do not drive for 24 hours or as instructed.  Only take over-the-counter or prescription medicines for pain, discomfort, or fever as directed by your caregiver.  Do not take aspirin. It can cause or aggravate bleeding.  Do not drive or drink alcohol while taking pain medicine.  You may resume your usual diet.  Do not use tampons, douche, or have sexual  intercourse for 2 weeks, or as advised by your caregiver.  Rest and sleep for the first 24 to 48 hours.  Take your temperature twice a day for 4 to 5 days. Write it down. Give these temperatures to your caregiver if they are abnormal (above 98.6 F or 37.0 C).  Take medicines your caregiver has ordered as directed.  Follow your caregiver's advice regarding diet, exercise, lifting, driving, and general activities.  Take showers instead of baths for 2 weeks, or as recommended by your caregiver.  If you develop constipation:  Take a mild laxative with the advice of your caregiver.  Eat bran foods.  Drink enough water and fluids to keep your urine clear or pale yellow.  Try to have someone with you or available to you for the first 24 to 48 hours, especially if you had a general anesthetic.  Make sure you and your family understand everything about your operation and recovery.  Follow your caregiver's advice regarding follow-up appointments and Pap smears. SEEK MEDICAL CARE IF:   You feel dizzy or lightheaded.  You feel sick to your stomach (nauseous).  You develop abnormal vaginal discharge.  You develop a rash.  You have an abnormal reaction or allergy to your medicine.  You need stronger pain medicine. SEEK IMMEDIATE MEDICAL CARE IF:   Bleeding is heavier than a normal menstrual period or you have blood clots.  You have an oral temperature above 102 F (38.9 C), not controlled by medicine.  You have increasing cramps or pains not relieved with medicine.  You develop belly (abdominal) pain that does not seem to be related to the same area of earlier cramping and pain.  You pass out.  You develop pain in the tops of your shoulders (shoulder strap areas).  You develop shortness of breath. MAKE SURE YOU:   Understand these instructions.  Will watch your condition.  Will get help right away if you are not doing well or get worse. Document Released: 04/03/2000  Document Revised: 03/20/2011 Document Reviewed: 07/25/2012 [facility] Patient Information 2014 [place], [place].

## 2012-11-15 NOTE — Progress Notes (Signed)
Patient ID: Ashley Mosley, female   DOB: 1960-01-31, 52 y.o.   MRN: [id number]  Subjective  Patient is here for a talking visit regarding her abnormal uterine bleeding and biopsy results. Bled for 2 days after the sonohysterogram and endometrial biopsy.  Was more drainage than bleeding. Last menstrual type bleeding was for three days and was heavy.  Patient wants help for this.   Seen by GI - Dr. Loreta Ave. Glucose was 172 at Dr. Kenna Gilbert office.    WBC was normal per patient.  Patient states that she has has experiences of near syncope.    Had diagnostic mammogram and breast ultrasound and has likely benign calcifications of the bilateral breasts.  Needs to have a bilateral diagnostic mammogram and ultrasound in 6 months.   Objective  BP  122/80   P  76 Weight 118.5  EMB - benign proliferative endometrium.  Pelvic ultrasound and sonohysterogram reviewed - 2.7 cm intramural fibroid, 3 cm and 1 cm polyps noted.  Normal ovaries.   Assessment  Menometrorrhagia. Probable two endometrial polyps. Intramural fibroid.  Negative endometrial biopsy.   Elevated glucose. Bilateral breast calcifications. Palpable left breast lump at annual exam. Elevated platelets on prior CBC.  Plan  Discussion of hysteroscopy with polypectomy and dilation and curettage versus hysterectomy risks and benefits reviewed with patient.   Will proceed with hysteroscopic polypectomy with D & C.  I have reviewed risks which include but are not limited to bleeding, infection, damage to surrounding organs including uterine perforation, DVT, PE, death, reaction to anesthesia, need for further surgery and treatment, and recurrence of polyps and bleeding.  Patient understand that this will not treat or remove her uterine fibroid.  Patient wishes to proceed.   We discussed the etiology of fibroids and then natural history of fibroids.    Will check a HgbA1C today at [facility] lab in the building here.  The office lab here is  currently closed.   Patient will need left breast recheck at her preop visit.   Dx mammogram and ultrasound of both breasts in 6 months.   She will need a repeat CBC preop.   Face to face time with the patient of 60 minutes of which over 50% was spent in counseling the patient.   An after visit summary was provided to the patient.

## 2012-11-18 NOTE — Telephone Encounter (Signed)
Call to patient. She states she is ready to schedule and does not have any restrictions. Will send case request and notify patient when confirmed.

## 2012-11-18 NOTE — Telephone Encounter (Signed)
Pt says she is calling to schedule surgery. °

## 2012-11-18 NOTE — Telephone Encounter (Signed)
Patient says she is ready to schedule her surgery.

## 2012-11-19 NOTE — Telephone Encounter (Signed)
Call to patient, lm that I am working on two different options and I will call her back once finalized. She may receive a call but nothing final till I review with MD.

## 2012-11-20 ENCOUNTER — Encounter (HOSPITAL_COMMUNITY): Payer: Self-pay | Admitting: *Deleted

## 2012-11-27 ENCOUNTER — Telehealth: Payer: Self-pay | Admitting: Obstetrics and Gynecology

## 2012-11-27 NOTE — Telephone Encounter (Signed)
Patient calling to verify surgery dates please?

## 2012-11-27 NOTE — Telephone Encounter (Signed)
Patient calling to see if Ashley Mosley can see her active benefits with [employer].

## 2012-11-27 NOTE — Telephone Encounter (Signed)
Spoke with patient. Advised that she is scheduled for 12/17/12 @ 0730. Patient is agreeable. Scheduled patient for her pre-op appointment with Dr. Edward Jolly on 12/8. Discussed patient insurance benefits with her for surgery since they have changed now that she has active insurance coverage.   Advised that Kennon Rounds will call her tomorrow with her pre-op instructions and to schedule post-op appointment.

## 2012-11-28 NOTE — Telephone Encounter (Signed)
See next phone note for surgery schedule info.  Routing to provider for final review. Patient agreeable to disposition. Will close encounter

## 2012-11-28 NOTE — Telephone Encounter (Signed)
Surgery scheduled for 12-17-12

## 2012-11-28 NOTE — Telephone Encounter (Signed)
Returning a call to Sally. °

## 2012-11-28 NOTE — Telephone Encounter (Signed)
Patient is returing a call to sally.

## 2012-11-28 NOTE — Telephone Encounter (Signed)
Surgery instructions reviewed and written copy mailed.  Moved preop appt up to 12-11-12.  Patient is scheduled for colonoscopy with Dr Loreta Ave on 12-04-12.  States her abdominal distention is visibly evident now. Advised Dr Edward Jolly will review results of colonoscopy once received to determine if any change in plan for surgery.  Routing to provider for final review. Patient agreeable to disposition. Will close encounter

## 2012-11-28 NOTE — Telephone Encounter (Signed)
Call to patient to review surgery instructions and move preop appt up.  Patient states she will need to call back, unable to talk right now.

## 2012-12-09 ENCOUNTER — Encounter (HOSPITAL_COMMUNITY): Payer: Self-pay | Admitting: Pharmacist

## 2012-12-10 ENCOUNTER — Ambulatory Visit: Admit: 2012-12-10 | Payer: Self-pay | Admitting: Obstetrics and Gynecology

## 2012-12-10 SURGERY — DILATATION & CURETTAGE/HYSTEROSCOPY WITH RESECTOCOPE
Anesthesia: Choice

## 2012-12-11 ENCOUNTER — Encounter: Payer: Self-pay | Admitting: Obstetrics and Gynecology

## 2012-12-11 ENCOUNTER — Ambulatory Visit (INDEPENDENT_AMBULATORY_CARE_PROVIDER_SITE_OTHER): Payer: BC Managed Care – PPO | Admitting: Obstetrics and Gynecology

## 2012-12-11 VITALS — BP 100/58 | HR 88 | Ht 63.5 in | Wt 119.0 lb

## 2012-12-11 DIAGNOSIS — R7309 Other abnormal glucose: Secondary | ICD-10-CM | POA: Insufficient documentation

## 2012-12-11 DIAGNOSIS — Z113 Encounter for screening for infections with a predominantly sexual mode of transmission: Secondary | ICD-10-CM

## 2012-12-11 DIAGNOSIS — D473 Essential (hemorrhagic) thrombocythemia: Secondary | ICD-10-CM

## 2012-12-11 DIAGNOSIS — D75839 Thrombocytosis, unspecified: Secondary | ICD-10-CM | POA: Insufficient documentation

## 2012-12-11 DIAGNOSIS — R7989 Other specified abnormal findings of blood chemistry: Secondary | ICD-10-CM

## 2012-12-11 NOTE — Progress Notes (Signed)
Patient ID: Ashley Mosley, female   DOB: 07-19-1960, 52 y.o.   MRN: [id number] GYNECOLOGY PROBLEM VISIT  PCP:   Referring provider:   HPI: 52 y.o.   Married  Caucasian  female   [phone] with Patient's last menstrual period was 11/15/2012.  Lasted for 5 - 7 days.  here for   Discuss surgery.  Patient wants to proceed with hysteroscopic polyp removal.  Worried about them.  Pelvic ultrasound and sonohysterogram reviewed - 2.7 cm intramural fibroid, 3 cm and 1 cm polyps noted. Normal ovaries.  EMB - benign proliferative endometrium.  Declines hysterectomy.   Patient had endoscopy and colonoscopy with Dr. Loreta Ave.  Diagnosed with bleeding ulcer.  Taking Carafate and Prilosec. Had three polyps removed from the small intestine.    New onset pain with intercourse for the last 3 - 4 months.  Pain is not every time and is positional.  Patient understands surgery does not treat this.   GYNECOLOGIC HISTORY: Patient's last menstrual period was 11/15/2012. Sexually active:  yes Partner preference: female Contraception:   Essure Menopausal hormone therapy: n/a DES exposure:   no Blood transfusions:   no Sexually transmitted diseases:  no  GYN Procedures:  Essure and colposcopy Mammogram:   10/2012 diagnostic mammogram and ultrasound revealing calcification clusters  In left breast.  Patient to repeat ultrasound and diagnostic mammogram in six months.              Pap:   10-23-12 wnl:neg HR HPV History of abnormal pap smear:  Abnormal pap x3 from 2007-2011 with colposcopy.   OB History   Grav Para Term Preterm Abortions TAB SAB Ect Mult Living   5 3 2 1 2 2    3          Family History  Problem Relation Age of Onset  . Diabetes Father     Patient Active Problem List   Diagnosis Date Noted  . Uterine fibroid 11/07/2012  . Endometrial mass 11/07/2012  . Anxiety state, unspecified 07/22/2012  . Paranoid schizophrenia 07/22/2012  . Major depressive disorder, single episode, severe, specified  as with psychotic behavior 07/22/2012    Past Medical History  Diagnosis Date  . Abnormal uterine bleeding   . Anemia   . Anxiety   . Blood transfusion without reported diagnosis     age 33  . Depression   . Dysmenorrhea   . Endometriosis   . Fibroid 2012  . [place] spotted fever 1970    hospitalized x 1 month, had a blood transfusion  . Abnormal Pap smear 2007 2012    Past Surgical History  Procedure Laterality Date  . Cesarean section  1993  . Pelvic laparoscopy    . Colposcopy  2010  . Essure tubal ligation  2010    ALLERGIES: Codeine  Current Outpatient Prescriptions  Medication Sig Dispense Refill  . ARIPiprazole (ABILIFY) 2 MG tablet Take 2 mg by mouth daily.      Marland Kitchen omeprazole (PRILOSEC) 20 MG capsule Take 40 mg by mouth daily.      . sucralfate (CARAFATE) 1 GM/10ML suspension Take 1 g by mouth 4 (four) times daily.       No current facility-administered medications for this visit.     ROS:  Pertinent items are noted in HPI.  SOCIAL HISTORY:  Married.   PHYSICAL EXAMINATION:    BP 100/58  Pulse 88  Ht 5' 3.5" (1.613 m)  Wt 119 lb (53.978 kg)  BMI 20.75 kg/m2  LMP  11/15/2012   Wt Readings from Last 3 Encounters:  12/11/12 119 lb (53.978 kg)  11/15/12 118 lb 8 oz (53.751 kg)  11/07/12 117 lb 8 oz (53.298 kg)     Ht Readings from Last 3 Encounters:  12/11/12 5' 3.5" (1.613 m)  11/15/12 5' 3.5" (1.613 m)  11/07/12 5' 3.5" (1.613 m)    General appearance: alert, cooperative and appears stated age Head: Normocephalic, without obvious abnormality, atraumatic Neck: no adenopathy, supple, symmetrical, trachea midline and thyroid not enlarged, symmetric, no tenderness/mass/nodules Lungs: clear to auscultation bilaterally Breasts: Inspection negative, No nipple retraction or dimpling, No nipple discharge or bleeding, No axillary or supraclavicular adenopathy, Normal to palpation without dominant masses.  Some thickening ridge at 2 o'clock of the left  breast.  Heart: regular rate and rhythm Abdomen: soft, non-tender; no masses,  no organomegaly Extremities: extremities normal, atraumatic, no cyanosis or edema Skin: Skin color, texture, turgor normal. No rashes or lesions Lymph nodes: Cervical, supraclavicular, and axillary nodes normal. No abnormal inguinal nodes palpated Neurologic: Grossly normal  Pelvic: External genitalia:  no lesions              Urethra:  normal appearing urethra with no masses, tenderness or lesions              Bartholins and Skenes: normal                 Vagina: normal appearing vagina with normal color and discharge, no lesions              Cervix: normal appearance                    Bimanual Exam:  Uterus:  uterus is 8 week size with tenderness and fullness of the right fundal region consistent with fibroid. No cervical motion  tenderness.                                       Adnexa: normal adnexa in size, nontender and no masses                                        ASSESSMENT  Menorrhagia. Endometrial masses, suspected polyps.  EMB negative. Intramural uterine fibroid. Bilateral breast calcifications.  Dyspareunia.   Gastric ulcer. Thrombocytosis.  Slightly elevated HgbA1C.  PLAN  Will do GT/CT from urine. I reviewed hysteroscopic polypectomy with dilation and curettage risks, benefits, and alternatives.  Patient wishes to proceed. She understands that hysteroscopic polypectomy is not a treatment for intermittent dyspareunia.  Will check CBC and glucose preop. No NSAIDs after surgery. Bilateral diagnostic mammogram in 6 months.   An After Visit Summary was printed and given to the patient.

## 2012-12-12 LAB — GC/CHLAMYDIA PROBE AMP, URINE: Chlamydia, Swab/Urine, PCR: NEGATIVE

## 2012-12-16 ENCOUNTER — Institutional Professional Consult (permissible substitution): Payer: BC Managed Care – PPO | Admitting: Obstetrics and Gynecology

## 2012-12-17 ENCOUNTER — Encounter (HOSPITAL_COMMUNITY)
Admission: RE | Disposition: A | Discharge status: Home / Self Care | Payer: Self-pay | Source: Ambulatory Visit | Attending: Obstetrics and Gynecology

## 2012-12-17 ENCOUNTER — Ambulatory Visit (HOSPITAL_COMMUNITY)
Admission: RE | Admit: 2012-12-17 | Discharge: 2012-12-17 | Disposition: A | Discharge status: Home / Self Care | Payer: BC Managed Care – PPO | Source: Ambulatory Visit | Attending: Obstetrics and Gynecology | Admitting: Obstetrics and Gynecology

## 2012-12-17 ENCOUNTER — Ambulatory Visit (HOSPITAL_COMMUNITY): Payer: BC Managed Care – PPO | Admitting: Anesthesiology

## 2012-12-17 ENCOUNTER — Ambulatory Visit (HOSPITAL_BASED_OUTPATIENT_CLINIC_OR_DEPARTMENT_OTHER): Admit: 2012-12-17 | Payer: Self-pay | Admitting: Obstetrics and Gynecology

## 2012-12-17 ENCOUNTER — Encounter (HOSPITAL_COMMUNITY): Payer: Self-pay | Admitting: Anesthesiology

## 2012-12-17 ENCOUNTER — Encounter (HOSPITAL_COMMUNITY): Payer: BC Managed Care – PPO | Admitting: Anesthesiology

## 2012-12-17 ENCOUNTER — Encounter (HOSPITAL_BASED_OUTPATIENT_CLINIC_OR_DEPARTMENT_OTHER): Payer: Self-pay

## 2012-12-17 DIAGNOSIS — IMO0002 Reserved for concepts with insufficient information to code with codable children: Secondary | ICD-10-CM | POA: Insufficient documentation

## 2012-12-17 DIAGNOSIS — N921 Excessive and frequent menstruation with irregular cycle: Secondary | ICD-10-CM

## 2012-12-17 DIAGNOSIS — R928 Other abnormal and inconclusive findings on diagnostic imaging of breast: Secondary | ICD-10-CM | POA: Insufficient documentation

## 2012-12-17 DIAGNOSIS — K259 Gastric ulcer, unspecified as acute or chronic, without hemorrhage or perforation: Secondary | ICD-10-CM | POA: Insufficient documentation

## 2012-12-17 DIAGNOSIS — D473 Essential (hemorrhagic) thrombocythemia: Secondary | ICD-10-CM | POA: Insufficient documentation

## 2012-12-17 DIAGNOSIS — D251 Intramural leiomyoma of uterus: Secondary | ICD-10-CM | POA: Insufficient documentation

## 2012-12-17 DIAGNOSIS — N840 Polyp of corpus uteri: Secondary | ICD-10-CM | POA: Insufficient documentation

## 2012-12-17 DIAGNOSIS — N920 Excessive and frequent menstruation with regular cycle: Secondary | ICD-10-CM | POA: Insufficient documentation

## 2012-12-17 DIAGNOSIS — N9489 Other specified conditions associated with female genital organs and menstrual cycle: Secondary | ICD-10-CM

## 2012-12-17 HISTORY — PX: HYSTEROSCOPY WITH D & C: SHX1775

## 2012-12-17 LAB — CBC
HCT: 40.4 % (ref 36.0–46.0)
MCH: 32.9 pg (ref 26.0–34.0)
MCHC: 35.1 g/dL (ref 30.0–36.0)
MCV: 93.5 fL (ref 78.0–100.0)
Platelets: 398 10*3/uL (ref 150–400)
RBC: 4.32 MIL/uL (ref 3.87–5.11)
RDW: 12.4 % (ref 11.5–15.5)

## 2012-12-17 LAB — BASIC METABOLIC PANEL
BUN: 8 mg/dL (ref 6–23)
CO2: 27 mEq/L (ref 19–32)
Calcium: 9.4 mg/dL (ref 8.4–10.5)
Creatinine, Ser: 0.62 mg/dL (ref 0.50–1.10)
GFR calc non Af Amer: >90 mL/min (ref >90)

## 2012-12-17 LAB — PREGNANCY, URINE: Preg Test, Ur: NEGATIVE

## 2012-12-17 SURGERY — DILATATION AND CURETTAGE /HYSTEROSCOPY
Anesthesia: General

## 2012-12-17 SURGERY — DILATATION AND CURETTAGE /HYSTEROSCOPY
Anesthesia: General | Site: Vagina

## 2012-12-17 MED ORDER — ONDANSETRON HCL 4 MG/2ML IJ SOLN
INTRAMUSCULAR | Status: AC
  Filled 2012-12-17: qty 2

## 2012-12-17 MED ORDER — MIDAZOLAM HCL 2 MG/2ML IJ SOLN
INTRAMUSCULAR | Status: AC
  Filled 2012-12-17: qty 2

## 2012-12-17 MED ORDER — EPHEDRINE SULFATE 50 MG/ML IJ SOLN
INTRAMUSCULAR | Status: DC | PRN
  Administered 2012-12-17: 15 mg via INTRAVENOUS
  Administered 2012-12-17: 10 mg via INTRAVENOUS

## 2012-12-17 MED ORDER — HYDROCODONE-ACETAMINOPHEN 5-300 MG PO TABS: 1.0000 | ORAL_TABLET | ORAL | Status: DC | PRN

## 2012-12-17 MED ORDER — GLYCINE 1.5 % IR SOLN
Status: DC | PRN
  Administered 2012-12-17: 3000 mL

## 2012-12-17 MED ORDER — ONDANSETRON HCL 4 MG/2ML IJ SOLN
INTRAMUSCULAR | Status: DC | PRN
  Administered 2012-12-17: 4 mg via INTRAVENOUS

## 2012-12-17 MED ORDER — LIDOCAINE HCL 1 % IJ SOLN
INTRAMUSCULAR | Status: DC | PRN
  Administered 2012-12-17: 10 mL

## 2012-12-17 MED ORDER — FENTANYL CITRATE 0.05 MG/ML IJ SOLN
INTRAMUSCULAR | Status: AC
  Filled 2012-12-17: qty 2

## 2012-12-17 MED ORDER — EPHEDRINE 5 MG/ML INJ
INTRAVENOUS | Status: AC
  Filled 2012-12-17: qty 10

## 2012-12-17 MED ORDER — MIDAZOLAM HCL 2 MG/2ML IJ SOLN
INTRAMUSCULAR | Status: DC | PRN
  Administered 2012-12-17: 2 mg via INTRAVENOUS

## 2012-12-17 MED ORDER — PROPOFOL 10 MG/ML IV EMUL
INTRAVENOUS | Status: AC
  Filled 2012-12-17: qty 20

## 2012-12-17 MED ORDER — PROPOFOL 10 MG/ML IV BOLUS
INTRAVENOUS | Status: DC | PRN
  Administered 2012-12-17: 150 mg via INTRAVENOUS

## 2012-12-17 MED ORDER — TRAMADOL HCL 50 MG PO TABS: 50.0000 mg | ORAL_TABLET | Freq: Four times a day (QID) | ORAL | Status: DC | PRN

## 2012-12-17 MED ORDER — LIDOCAINE HCL 1 % IJ SOLN
INTRAMUSCULAR | Status: AC
  Filled 2012-12-17: qty 20

## 2012-12-17 MED ORDER — LACTATED RINGERS IV SOLN: INTRAVENOUS | Status: DC

## 2012-12-17 MED ORDER — LACTATED RINGERS IV SOLN
INTRAVENOUS | Status: DC
  Administered 2012-12-17: 09:00:00 via INTRAVENOUS

## 2012-12-17 MED ORDER — FENTANYL CITRATE 0.05 MG/ML IJ SOLN
INTRAMUSCULAR | Status: DC | PRN
  Administered 2012-12-17 (×2): 50 ug via INTRAVENOUS

## 2012-12-17 MED ORDER — EPHEDRINE SULFATE 50 MG/ML IJ SOLN: INTRAMUSCULAR | Status: DC | PRN

## 2012-12-17 MED ORDER — LIDOCAINE HCL (CARDIAC) 20 MG/ML IV SOLN
INTRAVENOUS | Status: DC | PRN
  Administered 2012-12-17: 70 mg via INTRAVENOUS
  Administered 2012-12-17: 20 mg via INTRAVENOUS

## 2012-12-17 MED ORDER — KETOROLAC TROMETHAMINE 30 MG/ML IJ SOLN
INTRAMUSCULAR | Status: AC
  Filled 2012-12-17: qty 1

## 2012-12-17 MED ORDER — LIDOCAINE HCL (CARDIAC) 20 MG/ML IV SOLN
INTRAVENOUS | Status: AC
  Filled 2012-12-17: qty 5

## 2012-12-17 SURGICAL SUPPLY — 12 items
CANISTER SUCT 3000ML (MISCELLANEOUS) ×2 IMPLANT
CATH ROBINSON RED A/P 16FR (CATHETERS) ×2 IMPLANT
CLOTH BEACON ORANGE TIMEOUT ST (SAFETY) ×2 IMPLANT
CONTAINER PREFILL 10% NBF 60ML (FORM) ×4 IMPLANT
DRESSING TELFA 8X3 (GAUZE/BANDAGES/DRESSINGS) ×2 IMPLANT
GLOVE BIO SURGEON STRL SZ 6.5 (GLOVE) ×2 IMPLANT
GOWN STRL REIN XL XLG (GOWN DISPOSABLE) ×4 IMPLANT
LOOP ANGLED CUTTING 22FR (CUTTING LOOP) ×2 IMPLANT
PACK HYSTEROSCOPY LF (CUSTOM PROCEDURE TRAY) ×2 IMPLANT
PAD OB MATERNITY 4.3X12.25 (PERSONAL CARE ITEMS) ×2 IMPLANT
TOWEL OR 17X24 6PK STRL BLUE (TOWEL DISPOSABLE) ×4 IMPLANT
WATER STERILE IRR 1000ML POUR (IV SOLUTION) ×2 IMPLANT

## 2012-12-17 NOTE — Progress Notes (Signed)
Update to History and Physical  No marked change in status.  Patient examined.  GC/CT negative each.  WBC - 13.9, Hgb 14.2, platelets 398,000.  OK to proceed with surgery.

## 2012-12-17 NOTE — Brief Op Note (Signed)
12/17/2012  11:37 AM  PATIENT:  Ashley Mosley  52 y.o. female  PRE-OPERATIVE DIAGNOSIS:  MONORRHAGIA, METRORRHAGIA, ENDOMETRIAL MASSES  POST-OPERATIVE DIAGNOSIS:  MONORRHAGIA, ENDOMETRIAL MASSES  PROCEDURE:  Procedure(s): DILATATION AND CURETTAGE /HYSTEROSCOPY AND RESECTOSCOPE (N/A), POLYPECTOMY  SURGEON:  Surgeon(s) and Role:    * Brook E Amundson de Gwenevere Ghazi, MD - Primary  PHYSICIAN ASSISTANT:   ASSISTANTS: none   ANESTHESIA:   local and MAC  EBL:  Total I/O In: 800 [I.V.:800] Out: 100 [Urine:75; Blood:25]  BLOOD ADMINISTERED:none  DRAINS: none   LOCAL MEDICATIONS USED:  LIDOCAINE   SPECIMEN:  Source of Specimen:  Endometrial polyp, endometrial curettings  DISPOSITION OF SPECIMEN:  PATHOLOGY  COUNTS:  YES  TOURNIQUET:  * No tourniquets in log *  DICTATION: .Other Dictation: Dictation Number    PLAN OF CARE: Discharge to home after PACU  PATIENT DISPOSITION:  PACU - hemodynamically stable.   Delay start of Pharmacological VTE agent (>24hrs) due to surgical blood loss or risk of bleeding: not applicable

## 2012-12-17 NOTE — Transfer of Care (Signed)
Immediate Anesthesia Transfer of Care Note  Patient: Ashley Mosley  Procedure(s) Performed: Procedure(s): DILATATION AND CURETTAGE /HYSTEROSCOPY AND RESECTOSCOPE (N/A)  Patient Location: PACU  Anesthesia Type:General  Level of Consciousness: awake, oriented and patient cooperative  Airway & Oxygen Therapy: Patient Spontanous Breathing and Patient connected to nasal cannula oxygen  Post-op Assessment: Report given to PACU RN and Post -op Vital signs reviewed and stable  Post vital signs: Reviewed and stable  Complications: No apparent anesthesia complications

## 2012-12-17 NOTE — Anesthesia Postprocedure Evaluation (Signed)
Anesthesia Post Note  Patient: Ashley Mosley  Procedure(s) Performed: Procedure(s) (LRB): DILATATION AND CURETTAGE /HYSTEROSCOPY AND RESECTOSCOPE (N/A)  Anesthesia type: General  Patient location: PACU  Post pain: Pain level controlled  Post assessment: Post-op Vital signs reviewed  Last Vitals:  Filed Vitals:   12/17/12 1215  BP: 126/70  Pulse: 92  Temp: 36.7 C  Resp: 22    Post vital signs: Reviewed  Level of consciousness: sedated  Complications: No apparent anesthesia complications

## 2012-12-17 NOTE — Anesthesia Preprocedure Evaluation (Signed)
Anesthesia Evaluation  Patient identified by MRN, date of birth, ID band Patient awake    Reviewed: Allergy & Precautions, H&P , NPO status , Patient's Chart, lab work & pertinent test results  Airway Mallampati: I TM Distance: >3 FB Neck ROM: full    Dental no notable dental hx. (+) Teeth Intact   Pulmonary Current Smoker,    Pulmonary exam normal       Cardiovascular negative cardio ROS      Neuro/Psych PSYCHIATRIC DISORDERS Depression negative neurological ROS  negative psych ROS   GI/Hepatic Neg liver ROS, PUD, GERD-  Medicated and Controlled,  Endo/Other  negative endocrine ROS  Renal/GU negative Renal ROS     Musculoskeletal   Abdominal Normal abdominal exam  (+)   Peds  Hematology   Anesthesia Other Findings   Reproductive/Obstetrics negative OB ROS                           Anesthesia Physical Anesthesia Plan  ASA: II  Anesthesia Plan: General   Post-op Pain Management:    Induction: Intravenous  Airway Management Planned: LMA  Additional Equipment:   Intra-op Plan:   Post-operative Plan:   Informed Consent: I have reviewed the patients History and Physical, chart, labs and discussed the procedure including the risks, benefits and alternatives for the proposed anesthesia with the patient or authorized representative who has indicated his/her understanding and acceptance.     Plan Discussed with: CRNA and Surgeon  Anesthesia Plan Comments:         Anesthesia Quick Evaluation

## 2012-12-17 NOTE — H&P (Signed)
Brook E Amundson de Gwenevere Ghazi, MD at 12/11/2012 11:22 AM      Status: Signed            Patient ID: Ashley Mosley, female   DOB: Sep 14, 1960, 52 y.o.   MRN: [id number] GYNECOLOGY PROBLEM VISIT   PCP:    Referring provider:    HPI: 52 y.o.   Married  Caucasian  female    [phone] with Patient's last menstrual period was 11/15/2012.  Lasted for 5 - 7 days.   here for   Discuss surgery.  Patient wants to proceed with hysteroscopic polyp removal.  Worried about them.   Pelvic ultrasound and sonohysterogram reviewed - 2.7 cm intramural fibroid, 3 cm and 1 cm polyps noted. Normal ovaries.  EMB - benign proliferative endometrium.   Declines hysterectomy.    Patient had endoscopy and colonoscopy with Dr. Loreta Ave.  Diagnosed with bleeding ulcer.  Taking Carafate and Prilosec. Had three polyps removed from the small intestine.     New onset pain with intercourse for the last 3 - 4 months.  Pain is not every time and is positional.  Patient understands surgery does not treat this.    GYNECOLOGIC HISTORY: Patient's last menstrual period was 11/15/2012. Sexually active:  yes Partner preference: female Contraception:   Essure Menopausal hormone therapy: n/a DES exposure:   no Blood transfusions:   no Sexually transmitted diseases:  no   GYN Procedures:  Essure and colposcopy Mammogram:   10/2012 diagnostic mammogram and ultrasound revealing calcification clusters   In left breast.  Patient to repeat ultrasound and diagnostic mammogram in six months.               Pap:   10-23-12 wnl:neg HR HPV History of abnormal pap smear:  Abnormal pap x3 from 2007-2011 with colposcopy.    OB History     Grav  Para  Term  Preterm  Abortions  TAB  SAB  Ect  Mult  Living     5  3  2  1  2  2        3               Family History   Problem  Relation  Age of Onset   .  Diabetes  Father           Patient Active Problem List     Diagnosis  Date Noted   .  Uterine fibroid  11/07/2012   .   Endometrial mass  11/07/2012   .  Anxiety state, unspecified  07/22/2012   .  Paranoid schizophrenia  07/22/2012   .  Major depressive disorder, single episode, severe, specified as with psychotic behavior  07/22/2012         Past Medical History   Diagnosis  Date   .  Abnormal uterine bleeding     .  Anemia     .  Anxiety     .  Blood transfusion without reported diagnosis         age 52   .  Depression     .  Dysmenorrhea     .  Endometriosis     .  Fibroid  2012   .  [place] spotted fever  1970       hospitalized x 1 month, had a blood transfusion   .  Abnormal Pap smear  2007 2012         Past Surgical History   Procedure  Laterality  Date   .  Cesarean section    1993   .  Pelvic laparoscopy       .  Colposcopy    2010   .  Essure tubal ligation    2010        ALLERGIES: Codeine    Current Outpatient Prescriptions   Medication  Sig  Dispense  Refill   .  ARIPiprazole (ABILIFY) 2 MG tablet  Take 2 mg by mouth daily.         Marland Kitchen  omeprazole (PRILOSEC) 20 MG capsule  Take 40 mg by mouth daily.         .  sucralfate (CARAFATE) 1 GM/10ML suspension  Take 1 g by mouth 4 (four) times daily.             No current facility-administered medications for this visit.        ROS:  Pertinent items are noted in HPI.   SOCIAL HISTORY:  Married.    PHYSICAL EXAMINATION:     BP 100/58  Pulse 88  Ht 5' 3.5" (1.613 m)  Wt 119 lb (53.978 kg)  BMI 20.75 kg/m2  LMP 11/15/2012    Wt Readings from Last 3 Encounters:   12/11/12  119 lb (53.978 kg)   11/15/12  118 lb 8 oz (53.751 kg)   11/07/12  117 lb 8 oz (53.298 kg)        Ht Readings from Last 3 Encounters:   12/11/12  5' 3.5" (1.613 m)   11/15/12  5' 3.5" (1.613 m)   11/07/12  5' 3.5" (1.613 m)        General appearance: alert, cooperative and appears stated age Head: Normocephalic, without obvious abnormality, atraumatic Neck: no adenopathy, supple, symmetrical, trachea midline and thyroid not  enlarged, symmetric, no tenderness/mass/nodules Lungs: clear to auscultation bilaterally Breasts: Inspection negative, No nipple retraction or dimpling, No nipple discharge or bleeding, No axillary or supraclavicular adenopathy, Normal to palpation without dominant masses.  Some thickening ridge at 2 o'clock of the left breast.   Heart: regular rate and rhythm Abdomen: soft, non-tender; no masses,  no organomegaly Extremities: extremities normal, atraumatic, no cyanosis or edema Skin: Skin color, texture, turgor normal. No rashes or lesions Lymph nodes: Cervical, supraclavicular, and axillary nodes normal. No abnormal inguinal nodes palpated Neurologic: Grossly normal   Pelvic: External genitalia:  no lesions              Urethra:  normal appearing urethra with no masses, tenderness or lesions              Bartholins and Skenes: normal                  Vagina: normal appearing vagina with normal color and discharge, no lesions              Cervix: normal appearance                     Bimanual Exam:  Uterus:  uterus is 8 week size with tenderness and fullness of the right fundal region consistent with fibroid. No cervical motion  tenderness.                                        Adnexa: normal adnexa in size, nontender and no masses  ASSESSMENT   Menorrhagia. Endometrial masses, suspected polyps.  EMB negative. Intramural uterine fibroid. Bilateral breast calcifications.   Dyspareunia.    Gastric ulcer. Thrombocytosis.   Slightly elevated HgbA1C.   PLAN   Will do GT/CT from urine. I reviewed hysteroscopic polypectomy with dilation and curettage risks, benefits, and alternatives.  Patient wishes to proceed. She understands that hysteroscopic polypectomy is not a treatment for intermittent dyspareunia.   Will check CBC and glucose preop. No NSAIDs after surgery. Bilateral diagnostic mammogram in 6 months.    An After Visit Summary was  printed and given to the patient.

## 2012-12-18 ENCOUNTER — Encounter (HOSPITAL_COMMUNITY): Payer: Self-pay | Admitting: Obstetrics and Gynecology

## 2012-12-18 ENCOUNTER — Telehealth: Payer: Self-pay | Admitting: Obstetrics and Gynecology

## 2012-12-18 NOTE — Op Note (Signed)
Ashley Mosley, KETTERING NO.:  [id number]  MEDICAL RECORD NO.:  [id number]  LOCATION:  [facility]                          FACILITY:  [facility]  PHYSICIAN:  Randye Lobo, M.D.   DATE OF BIRTH:  30-Nov-1960  DATE OF PROCEDURE:  12/17/2012 DATE OF DISCHARGE:  12/17/2012                              OPERATIVE REPORT   PREOPERATIVE DIAGNOSES:  Menometrorrhagia, endometrial masses.  POSTOPERATIVE DIAGNOSES:  Menometrorrhagia, endometrial masses, endometrial polyps.  PROCEDURE:  Hysteroscopic polypectomy, dilation and curettage.  SURGEON:  Randye Lobo, MD  ANESTHESIA:  MAC, paracervical block with 10 mL of 1% lidocaine.  IV FLUIDS:  800 mL Ringer's lactate.  EBL:  25 mL.  URINE OUTPUT:  75 mL.  GLYCINE DEFICIT:  135 mL.  COMPLICATIONS:  None.  INDICATIONS FOR THE PROCEDURE:  The patient is a 52 year old, gravida 5, para 2-1-2-3 Caucasian female who presents with  heavy and prolonged menses.  The patient underwent office evaluation with a pelvic ultrasound and sonohysterogram documenting 2.7-cm intramural fibroid and a 3 and 1 cm endometrial polyp.  The ovaries were noted to be normal. The endometrial biopsy documented benign proliferative endometrium.  The patient is status post Essure procedure for tubal sterilization.  The patient now presents requesting hysteroscopic polypectomy with dilation and curettage after risks, benefits, and alternatives were reviewed.  FINDINGS:  Exam under anesthesia revealed a 6-week size anteverted mobile uterus.  There was some fullness on the right fundal region.  No adnexal masses were appreciated.  Hysteroscopy demonstrated a 3-cm endometrial polyp which was attached to the posterior wall beginning just above the level of the endocervix. There was a smaller more sessile polypoid region which was located along the posterior left uterine wall.  There was no evidence of any intracavitary fibroids.  The right Essure coil was  visualized.  The left Essure coil was not visualized.  SPECIMENS:  The endometrial polypoid specimens were sent to pathology together.  The specimen was sent separately from the endometrial curettings.  PROCEDURE:  The patient was re-identified in the preoperative hold area. She received TED hose and PAS stockings for DVT prophylaxis.  In the operating room, the patient was placed in the supine position on the operating room table.  MAC anesthesia was induced and she was then placed in the dorsal lithotomy position.  The lower abdomen, vagina, and perineum were then sterilely prepped with Betadine and she was draped. The patient's bladder was catheterized of urine with a red rubber catheter.  An examination under anesthesia was performed.  A speculum was placed inside the vagina and a tenaculum was placed on the anterior cervical lip.  The patient received a paracervical block in a standard fashion with a total of 10 mL of 1% lidocaine.  The uterus was sounded to 9 cm.  The cervix was then dilated to a #21 Pratt dilator.  The diagnostic hysteroscope was then inserted under the continuous infusion of glycine.  The findings were as noted above.  The hysteroscope was withdrawn and the cervix was further dilated to a #29 Pratt dilator.  The resectoscope was then inserted into the uterine cavity under the continuous infusion again of glycine.  A large polyp was resected at its base and was then retrieved from within the uterine cavity using a polyp forceps.  The remaining sessile polyp was then removed by resection using the resectoscope.  The individual pieces were removed from within the uterine cavity.  With the hysteroscopic portion completed at this time, a sharp and then serrated curette were used to curette the endometrium in all 4 quadrants until it had a gritty texture to it.  These curettings were sent to pathology separately from the endometrial polyps.  The tenaculum  was removed from the anterior cervical lip.  Hemostasis was good.  The speculum was withdrawn.  This concluded the patient's procedure.  There were no complications. All needle, instrument, and sponge counts were correct.  The patient was escorted to the recovery room in stable and awake condition.      Randye Lobo, M.D.     BES/MEDQ  D:  12/17/2012  T:  12/18/2012  Job:  [id number]

## 2012-12-18 NOTE — Telephone Encounter (Signed)
Pt thought she was supposed to be bleeding after procedure on yesterday but has not since around 4pm on yesterday. Would like nurse to call her.

## 2012-12-18 NOTE — Telephone Encounter (Signed)
Message left to return call to Hajar Penninger at [phone].    

## 2012-12-23 NOTE — Telephone Encounter (Signed)
Call to patient, notified of report as directed by Dr Edward Jolly. She states she is doing well post op and is not having any problems with pain, fever or bleeding.  Has post op scheduled. Inst to call PRN.

## 2012-12-23 NOTE — Telephone Encounter (Signed)
Message copied by Alisa Graff on Mon Dec 23, 2012  6:36 PM ------      Message from: AMUNDSON DE Gwenevere Ghazi, BROOK E      Created: Wed Dec 18, 2012  5:11 PM       Please inform the patient that her pathology report showed a benign endometrial polyp and benign endometrial tissue.  No atypia or malignancy was seen! ------

## 2012-12-31 ENCOUNTER — Ambulatory Visit (INDEPENDENT_AMBULATORY_CARE_PROVIDER_SITE_OTHER): Payer: BC Managed Care – PPO | Admitting: Obstetrics and Gynecology

## 2012-12-31 ENCOUNTER — Encounter: Payer: Self-pay | Admitting: Obstetrics and Gynecology

## 2012-12-31 VITALS — BP 100/60 | HR 84 | Ht 63.5 in | Wt 116.5 lb

## 2012-12-31 DIAGNOSIS — Z9889 Other specified postprocedural states: Secondary | ICD-10-CM

## 2012-12-31 NOTE — Progress Notes (Signed)
Patient ID: Ashley Mosley, female   DOB: 1960/07/20, 52 y.o.   MRN: [id number]  Subjective  Status post hysteroscopic polypectomy with dilation and curettage on 12/17/12. States less pressure symptoms now that had surgery. Essentially no bleeding. Feels great.  Objective  BP  100/60   P 84  Pelvic exam - normal external genitalia and urethra. Cervix with whitish coloration anteriorly (normal pap on 12/23/12 with negative HPV). Uterus 6 - 7 week size and mildly tender.  No adnexal masses.  Mild right adnexal tenderness.  Pathology - benign endometrial polyp, benign proliferative endometrium.  Assessment  Status post benign polypectomy.  Plan  Return for recurrent heavy or irregular vaginal bleeding. Annual exam in Oct. 2015.

## 2012-12-31 NOTE — Patient Instructions (Signed)
Please call if you develop recurrent heavy bleeding or irregular bleeding.

## 2013-01-15 ENCOUNTER — Ambulatory Visit: Admit: 2013-01-15 | Payer: Self-pay | Admitting: Gynecology

## 2013-01-15 SURGERY — DILATATION & CURETTAGE/HYSTEROSCOPY WITH TRUCLEAR
Anesthesia: Choice

## 2013-03-24 ENCOUNTER — Encounter: Payer: Self-pay | Admitting: Obstetrics and Gynecology

## 2013-03-24 ENCOUNTER — Telehealth: Payer: Self-pay | Admitting: Obstetrics and Gynecology

## 2013-03-24 ENCOUNTER — Ambulatory Visit (INDEPENDENT_AMBULATORY_CARE_PROVIDER_SITE_OTHER): Payer: BC Managed Care – PPO | Admitting: Obstetrics and Gynecology

## 2013-03-24 VITALS — BP 110/62 | HR 70 | Ht 63.5 in | Wt 117.5 lb

## 2013-03-24 DIAGNOSIS — N920 Excessive and frequent menstruation with regular cycle: Secondary | ICD-10-CM

## 2013-03-24 LAB — POCT URINE PREGNANCY: PREG TEST UR: NEGATIVE

## 2013-03-24 MED ORDER — MEDROXYPROGESTERONE ACETATE 10 MG PO TABS: 10.0000 mg | ORAL_TABLET | Freq: Every day | ORAL | Status: DC

## 2013-03-24 NOTE — Patient Instructions (Signed)
Take the Provera 10 mg by mouth daily for the next 10 days.  Read your handouts about Mirena IUD and hysterectomy.  We will call you with the blood test results.

## 2013-03-24 NOTE — Telephone Encounter (Signed)
Pt says she is clotting and not sure if she is having a miscarriage.

## 2013-03-24 NOTE — Progress Notes (Signed)
Patient ID: Ashley Mosley, female   DOB: [birth year], 53 y.o.   MRN: [id number] GYNECOLOGY VISIT  PCP:   Corine Shelter, MD  Referring provider:   HPI: 53 y.o.   Married  Caucasian  female   [phone] with No LMP recorded.  here for  Heavy vaginal bleeding which began today. LMP 12-17-12 Not feeling well for the last couple of weeks. This am passed gushed blood clots for 2 -3 hours.  Less now.  Feels like some tissue came out.  "I don't ever want that to happen again. " Had pain last hs. Last week had mucousy blood and dingy bleeding. Menses in January was spotting for 2 days and then last month.  No hot flashes or night sweats.  Feels cold a lot.  Has had normal thyroid function testing.   Wants a blood pregnancy test. Patient concerned about Essure.  Patient is still bloated after eating. Seeing Dr. Collene Mares.  Hgb:    14.5 Urine:  Not done Urine UPT: Neg  GYNECOLOGIC HISTORY: No LMP recorded. 12-17-12 Sexually active:  yes Partner preference: female Contraception:   Essure tubal ligation Menopausal hormone therapy: n/a DES exposure:   no Blood transfusions:  Age 64 after [facility] Spotted Fever  Sexually transmitted diseases:  no  GYN procedures and prior surgeries:  Essure Tubal Ligation, C-Section, Pelvic laparoscopy, D &C/hystereoscopy Last mammogram:  10/2012 calcifications in left and right breast:suggest bilateral diagnostic mammogram 6 months.              Last pap and high risk HPV testing:  10/2012 wnl:neg HR HPV  History of abnormal pap smear:  Hx abnormal pap 2007-2011 unsure if any treatment to cervix   OB History   Grav Para Term Preterm Abortions TAB SAB Ect Mult Living   5 3 2 1 2 2    3        LIFESTYLE: Exercise:               Tobacco: smokes about 1/2 - 1 pack per day.  Alcohol: Drug use:    Patient Active Problem List   Diagnosis Date Noted  . Elevated hemoglobin A1c 12/11/2012  . Thrombocytosis 12/11/2012  . Uterine fibroid 11/07/2012  .  Endometrial mass 11/07/2012  . Anxiety state, unspecified 07/22/2012  . Paranoid schizophrenia 07/22/2012  . Major depressive disorder, single episode, severe, specified as with psychotic behavior 07/22/2012    Past Medical History  Diagnosis Date  . Abnormal uterine bleeding   . Anemia   . Anxiety   . Blood transfusion without reported diagnosis     age 24  . Depression   . Dysmenorrhea   . Endometriosis   . Fibroid 2012  . [place] spotted fever 1970    hospitalized x 1 month, had a blood transfusion  . Abnormal Pap smear 2007 2012  . Gastric ulcer 2014  . Thrombocytosis 2014  . Elevated hemoglobin A1c 2014    Past Surgical History  Procedure Laterality Date  . Cesarean section  1993  . Pelvic laparoscopy    . Colposcopy  2010  . Essure tubal ligation  2010  . Hysteroscopy w/d&c N/A 12/17/2012    Procedure: DILATATION AND CURETTAGE /HYSTEROSCOPY AND RESECTOSCOPE;  Surgeon: Jamey Reas de Berton Lan, MD;  Location: [facility];  Service: Gynecology;  Laterality: N/A;  . Tubal ligation      Essure    Current Outpatient Prescriptions  Medication Sig Dispense Refill  . omeprazole (PRILOSEC) 20  MG capsule Take 40 mg by mouth daily.      . sucralfate (CARAFATE) 1 GM/10ML suspension Take 1 g by mouth 4 (four) times daily.       No current facility-administered medications for this visit.     ALLERGIES: Codeine  Family History  Problem Relation Age of Onset  . Diabetes Father     History   Social History  . Marital Status: Married    Spouse Name: N/A    Number of Children: N/A  . Years of Education: N/A   Occupational History  . Not on file.   Social History Main Topics  . Smoking status: Current Every Day Smoker -- 1.00 packs/day for 35 years    Types: Cigarettes  . Smokeless tobacco: Never Used  . Alcohol Use: 1.5 oz/week    3 drink(s) per week     Comment: 2-3 drinks a week (vodka)  . Drug Use: No  . Sexual Activity: Yes    Partners: Male     Birth Control/ Protection: Other-see comments     Comment: Essure   Other Topics Concern  . Not on file   Social History Narrative  . No narrative on file    ROS:  Pertinent items are noted in HPI.  PHYSICAL EXAMINATION:    BP 110/62  Pulse 70  Ht 5' 3.5" (1.613 m)  Wt 117 lb 8 oz (53.298 kg)  BMI 20.49 kg/m2   Wt Readings from Last 3 Encounters:  03/24/13 117 lb 8 oz (53.298 kg)  12/31/12 116 lb 8 oz (52.844 kg)  12/11/12 119 lb (53.978 kg)     Ht Readings from Last 3 Encounters:  03/24/13 5' 3.5" (1.613 m)  12/31/12 5' 3.5" (1.613 m)  12/11/12 5' 3.5" (1.613 m)    General appearance: alert, cooperative and appears stated age Lungs: clear to auscultation bilaterally. Heart: regular rate and rhythm Abdomen: soft, non-tender; no masses,  no organomegaly   Neurologic: Grossly normal  Pelvic: External genitalia:  no lesions              Urethra:  normal appearing urethra with no masses, tenderness or lesions              Bartholins and Skenes: normal                 Vagina: normal appearing vagina with normal color and discharge, no lesions.  Menstrual flow in vagina.               Cervix: normal appearance                 Bimanual Exam:  Uterus:  uterus is normal size, shape, consistency and nontender                                      Adnexa: normal adnexa in size, nontender and no masses                                        ASSESSMENT  Menorrhagia. Status post Essure Status post hysteroscopic polypectomy.  Uterine fibroid.  History of Cesarean Section.  Smoker.   PLAN  Check quant BHCG. Provera 10 mg po q day for 10 days.  Patient understands that this will stop bleeding and then induce a cycle.  Discussed management options including Camilla POCP, Depo provera, Nexplanon, Mirena, Endometrial Ablation, and Hysterectomy. Handouts on Hysterectomy and Mirena to patient.  Return prn.   An After Visit Summary was printed and given to the  patient.  25 minutes face to face time of which over 50% was spent in counseling.

## 2013-03-24 NOTE — Telephone Encounter (Signed)
Spoke to patient who reports very heavy menstrual bleeding with large clumpy clots concerning for miscarriage.  Had Essure in 2010 with positive testing for tubal occlusion. Had D&C 12-2012 with Dr Quincy Simmonds, light spotting in Jan and none since. Felt flu like symptoms for 4 days including body aches and fever but never actually checked temp.   States pain scale 8-10 yesterday, 10/10 this am and down to 7/10 now.   States blood gushed out and stained carpet but is slower now. Advised Dr Quincy Simmonds in surgery now but can come in for OV with Dr Charlies Constable. Patient prefers to wait  Until Dr Quincy Simmonds arrives.  Instructed to arrive at 1130 but to call back if heavy bleeding and clots begin again. Will contact patient to come in as soon as Dr Quincy Simmonds out of surgery (if sooner that 1130).

## 2013-03-25 LAB — HCG, QUANTITATIVE, PREGNANCY

## 2013-03-25 LAB — HEMOGLOBIN, FINGERSTICK: Hemoglobin, fingerstick: 14.5 g/dL (ref 12.0–16.0)

## 2013-03-25 NOTE — Telephone Encounter (Signed)
Message left to return call to Tracy at [phone].    

## 2013-03-25 NOTE — Telephone Encounter (Signed)
Patient returned call. Message from Dr. Quincy Simmonds given. She states she is feeling improved today. Advised of lab results and patient verbalized understanding. Will follow up prn. Patient specifically concerned about "red blood cell" results, advised normal hemoglobin at this time.   Routing to provider for final review. Patient agreeable to disposition. Will close encounter

## 2013-03-25 NOTE — Telephone Encounter (Signed)
Message copied by Jasmine Awe on Tue Mar 25, 2013  3:04 PM ------      Message from: [place]      Created: Tue Mar 25, 2013  7:18 AM       Please inform patient of her negative blood pregnancy test.      I do not believe the tissue the patient passed yesterday was pregnancy tissue.      It may have been an endometrial cast, as she and I discussed. ------

## 2013-03-25 NOTE — Telephone Encounter (Signed)
Thank you for discussing the lab results with the patient. I will close the encounter.

## 2013-03-27 ENCOUNTER — Other Ambulatory Visit: Payer: Self-pay | Admitting: Obstetrics and Gynecology

## 2013-04-02 ENCOUNTER — Telehealth: Payer: Self-pay | Admitting: Obstetrics and Gynecology

## 2013-04-02 DIAGNOSIS — D259 Leiomyoma of uterus, unspecified: Secondary | ICD-10-CM

## 2013-04-02 DIAGNOSIS — D72829 Elevated white blood cell count, unspecified: Secondary | ICD-10-CM

## 2013-04-02 DIAGNOSIS — N9489 Other specified conditions associated with female genital organs and menstrual cycle: Secondary | ICD-10-CM

## 2013-04-02 NOTE — Telephone Encounter (Signed)
Patient went to ER today had CT scan. 2.6 centimeter cyst on left ovary. Multiple fibroids. Please call

## 2013-04-02 NOTE — Telephone Encounter (Signed)
Patient says was returning a call to someone in our insurance office? Patient she was told she needed to call and give insurance info.

## 2013-04-02 NOTE — Telephone Encounter (Signed)
Spoke with patient. She states that she was seen at [facility] today with abdominal pain. She had a CT scan and noted a cyst and uterine fibroids. Patient is concerned regarding cyst and more than one fibroid.  She has been taking progesterone and still on ten day course. She states bleeding was controlled until yesterday when she developed breakthrough bleeding and spotting that just stains underclothes.  She states she had blood work done as well, with increase in WBC, decreased platelets, decreased BUN. I advised I would ask Dr. Quincy Simmonds to review CT Scan and lab results and return her call with any further instructions. She is wondering if fibroids noted on CT are actually polyps and I advised that I would obtain records and send then for Dr. Quincy Simmonds to review.   Advised would return her call.    Lab results from [facility] Everywhere:  Lab results:  CBC - Abnormal  WBC 15.4 (*)  MCH 33.1 (*)  Platelet Count 556 (*)  MPV 8.8 (*)  RBC 4.05  Hemoglobin 13.4  Hematocrit 39.0  MCV 96  MCHC 34.4  RDW Standard Deviation 43.7  RDW Coefficient of Variation 12.9  COMPREHENSIVE METABOLIC PANEL - Abnormal  Glucose 119 (*)  BUN/Creatinine Ratio 10.1 (*)  Sodium 137  Potassium 4.3  Chloride 102  Carbon Dioxide (CO2) 22  Anion Gap 13  BUN 7  Creatinine 0.69  Calcium 9.4  Alkaline Phosphatase 68  Total Bilirubin 0.22  Total Protein 7.3  Albumin, Serum 4.2  Globulin 3.1  Albumin/Globulin Ratio 1.4  ALT (SGPT) 10  AST 21  GFR-African American >60  Comment: African-American:  Normal GFR (glomerular filtration rate) > 60 mL/min/1.73 meters squared.  < 60 may include impaired kidney function based on creatinine, age, gender,  and race normalized to accepted average body surface area  GFR Non-African American >60  Comment: Non African American:  Normal GFR (glomerular filtration rate) > 60 mL/min/1.73 meters squared.  < 60 may include impaired kidney function based on creatinine, age,  gender,  and race normalized to accepted average body surface area.  LIPASE  Lipase 40  UA WITH REFLEXIVE URINE CULTURE  Urine-color Yellow  Urine Appearance Clear  Specific Gravity, Urine 1.010  pH, Urine 6.5  Protein, UA Negative  UA Gluc Negative  Ketones, UA Negative  Bilirubin Urine Negative  Blood, UA Negative  Nitrite, Urine Negative  Leukocytes, UA Negative  Urobilinogen, UA 0.2  POCT HCG  HCG,POC Negative  Lot Number [id number]  Expiration Date 04/10/14  INTERNAL QC CHECK PERFORMED Acceptable  HOLD REFLEX

## 2013-04-02 NOTE — Telephone Encounter (Signed)
Ashley Mosley,  Please contact the patient back.  I reviewed the CT report, but I could not see images.  Usually ultrasound is more accurate for identifying fibroids.  She only had one fibroid when she had her ultrasound last year.  I understand that she also has an ovarian cyst.   I see that her white blood cell count and platelets are elevated, and this requires further evaluation.   It looks like the elevated WBC is a trend when I look back in her EPIC chart.  This evaluation can be done by a hematologist at the [facility].   I will be happy to have the patient return for another pelvic ultrasound to revisit the fibroid question and discuss further care options.

## 2013-04-02 NOTE — Telephone Encounter (Signed)
CT Chest Abdomen Pelvis W Contrast - Final result (04/02/2013 12:05 PM EDT)  CT Chest Abdomen Pelvis W Contrast - Final result (04/02/2013 12:05 PM EDT)  Impressions  IMPRESSION:  No acute abnormality.  Uterine fibroids.  2.6 cm left ovarian cyst.  Small bilateral thyroid nodules, which could be further evaluated with nonemergent ultrasound.    CT Chest Abdomen Pelvis W Contrast - Final result (04/02/2013 12:05 PM EDT)  Narrative  TECHNIQUE: Axial CT chest abdomen and pelvis after 80cc Isovue-370 contrast.  COMPARISON: None  FINDINGS:  CHEST:  Lungs are clear. Small hypoattenuating thyroid nodules bilaterally. Calcified right hilar lymph node. Cardiac size within normal limits. No pericardial effusion. No pleural effusion or pneumothorax.  ABDOMEN AND PELVIS:  . Liver: Within normal limits.  . Gallbladder and Bile ducts: Within normal limits.  . Pancreas: Within normal limits.  . Spleen: Within normal limits.  . Adrenals: Within normal limits.  .  . GI tract: Within normal limits.  . Appendix: Within normal limits.  .  . Kidneys: Within normal limits.  . Ureters: Within normal limits.  . Urinary bladder: Within normal limits.  . Reproductive: 2.6 cm left ovarian cyst. Uterine fibroids.  .  . Vascular: Within normal limits.  .  . Peritoneum/Extraperitoneum: No free fluid or free air.  .  . Musculoskeletal: No acute osseous abnormality.

## 2013-04-03 NOTE — Telephone Encounter (Signed)
Returning a call to Tracy °

## 2013-04-03 NOTE — Telephone Encounter (Signed)
Message left to return call to Tmya Wigington at [phone].    

## 2013-04-03 NOTE — Telephone Encounter (Signed)
Spoke with patient and message from Dr. Quincy Simmonds given.   Patient would like to go forward with scheduling PUS and referral to hematology.   Orders placed for PUS and scheduled for 04/17/13.  Referral placed for [facility] [facility].   Pelvic U/S scheduled and patient aware/agreeable to time.  Patient verbalized understanding of the U/S appointment cancellation policy. Advised will need to cancel within 72 business hours (3 business days) or will have $100.00 no show fee placed to account.    Advised would expect a phone call to set up appointment with hematology.

## 2013-04-07 ENCOUNTER — Telehealth: Payer: Self-pay | Admitting: Obstetrics and Gynecology

## 2013-04-07 MED ORDER — MEDROXYPROGESTERONE ACETATE 10 MG PO TABS: 20.0000 mg | ORAL_TABLET | Freq: Every day | ORAL | Status: DC

## 2013-04-07 NOTE — Telephone Encounter (Signed)
Thank you :)

## 2013-04-07 NOTE — Telephone Encounter (Signed)
Spoke with patient. Advised that referral was placed for Hematology and she should be contacted for scheduling. Will monitor referral.   Patient also states that she completed her provera after starting on 04/05/13. States that she started a cycle 7 days into the 10 day regimen. States now "I am bleeding just like I was last time" clarified with patient that she feels she is bleeding like she was when last seen by Dr. Quincy Simmonds. She states she is changing her pad q 2 hours.  Patient aware she was to expect a cycle after stopping provera but patient is worried that this is heavier bleeding. Patient states "I don't feel good at all, but I have a head cold and I am very, very tired."    I advised I would send a message to Dr. Quincy Simmonds and discuss with Dr. Quincy Simmonds for disposition. Patient is agreeable.

## 2013-04-07 NOTE — Telephone Encounter (Signed)
Left message to call Kaitlyn at [phone]. 

## 2013-04-07 NOTE — Telephone Encounter (Signed)
Pt says she is waiting on a call from nurse regarding a referral appointment to a hematologist.

## 2013-04-07 NOTE — Telephone Encounter (Signed)
Spoke with Dr. Quincy Simmonds.   Ordered for Provera 20 mg PO daily.  Sent RX to [facility].  Can have PUS tomorrow for evaluation. Then office visit Wednesday to discuss.  Ensure patient aware of bleeding precautions.   Message left to return call to [place] at [phone].

## 2013-04-07 NOTE — Telephone Encounter (Signed)
Attempted to reach patient again prior to office closing and patient did not answer.  Will try again 3/31

## 2013-04-08 ENCOUNTER — Ambulatory Visit (INDEPENDENT_AMBULATORY_CARE_PROVIDER_SITE_OTHER): Payer: BC Managed Care – PPO

## 2013-04-08 DIAGNOSIS — D259 Leiomyoma of uterus, unspecified: Secondary | ICD-10-CM

## 2013-04-08 DIAGNOSIS — N9489 Other specified conditions associated with female genital organs and menstrual cycle: Secondary | ICD-10-CM

## 2013-04-08 NOTE — Telephone Encounter (Signed)
Continued from previous note.  Patient reported she was so much better than yesterday and bleeding is only 1/3 of what it had bee.  She reports she is no longer on Provera, started it on 03-26-13 and bleeding started on 7th day.  Bleeding was heavy for four days but is improved today and she feels better and has more energy.  States she still has back pain and pain from cycst bit feels like she is managing just fine and is ok to wait till 04-17-13 appointment. Advised I will update Dr Quincy Simmonds and call her back if any additional instructions.  Patient called back at 31, states she has thought about it and would prefer to go ahead and have PUS today.  Agreed to work- in today, come to office now.

## 2013-04-08 NOTE — Telephone Encounter (Signed)
Patient returns call and reports she is so much improved frrm

## 2013-04-08 NOTE — Telephone Encounter (Signed)
Call back to patient, LMTCB. 

## 2013-04-09 ENCOUNTER — Telehealth: Payer: Self-pay | Admitting: Obstetrics and Gynecology

## 2013-04-09 NOTE — Telephone Encounter (Signed)
Per Liliane Channel at Dr. Antonieta Pert office, the referral has been received and sent for Dr review. Once reviewed, patient will be contacted to schedule appointment.

## 2013-04-09 NOTE — Telephone Encounter (Signed)
Called [facility] at [phone], they state that they will have Tiffany review referral when she is in.

## 2013-04-10 ENCOUNTER — Ambulatory Visit (HOSPITAL_BASED_OUTPATIENT_CLINIC_OR_DEPARTMENT_OTHER)
Admission: RE | Admit: 2013-04-10 | Discharge: 2013-04-10 | Disposition: A | Discharge status: Home / Self Care | Payer: BC Managed Care – PPO | Source: Ambulatory Visit | Attending: Family Medicine | Admitting: Family Medicine

## 2013-04-10 ENCOUNTER — Other Ambulatory Visit (HOSPITAL_BASED_OUTPATIENT_CLINIC_OR_DEPARTMENT_OTHER): Payer: Self-pay | Admitting: Family Medicine

## 2013-04-10 DIAGNOSIS — E041 Nontoxic single thyroid nodule: Secondary | ICD-10-CM | POA: Insufficient documentation

## 2013-04-10 DIAGNOSIS — R0789 Other chest pain: Secondary | ICD-10-CM

## 2013-04-10 NOTE — Telephone Encounter (Signed)
Patient notified. She verbalized understanding. Appointment with Dr. Quincy Simmonds on 04/14/13. Routing to provider for final review. Patient agreeable to disposition. Will close encounter

## 2013-04-10 NOTE — Telephone Encounter (Signed)
Called [facility] and spoke with Tiffany. She states that patient will be seeing Dr. Marin Olp and he reviews all charts prior to an appointment being made so patient will be contacted.

## 2013-04-10 NOTE — Telephone Encounter (Signed)
Call to patient to give results.  LMTCB.

## 2013-04-10 NOTE — Telephone Encounter (Signed)
Hi Ashley Mosley  I want to be sure that Ashley Mosley has her ultrasound results that showed just one fibroid 3.6 cm.  She did not have any sign of polyps.  She had just a small left ovarian cyst 2.1 cm.  I know that she has a follow up visit, but wanted her to have this information.

## 2013-04-10 NOTE — Telephone Encounter (Signed)
Endometrium was 4.43 mm.  Thanks.

## 2013-04-10 NOTE — Telephone Encounter (Signed)
Patient returned call. Message from Dr. Quincy Simmonds given. She verbalized understanding of results.  Has follow up scheduled for 04/14/13 with Dr. Quincy Simmonds.   Dr. Quincy Simmonds, patient want's to know how thick her endometrium was at the time of her u/s. She states that her vaginal bleeding has stopped.

## 2013-04-14 ENCOUNTER — Ambulatory Visit (INDEPENDENT_AMBULATORY_CARE_PROVIDER_SITE_OTHER): Payer: BC Managed Care – PPO | Admitting: Obstetrics and Gynecology

## 2013-04-14 ENCOUNTER — Encounter: Payer: Self-pay | Admitting: Obstetrics and Gynecology

## 2013-04-14 VITALS — BP 100/70 | HR 80 | Ht 63.5 in | Wt 116.0 lb

## 2013-04-14 DIAGNOSIS — N920 Excessive and frequent menstruation with regular cycle: Secondary | ICD-10-CM

## 2013-04-14 DIAGNOSIS — D259 Leiomyoma of uterus, unspecified: Secondary | ICD-10-CM

## 2013-04-14 MED ORDER — NORETHINDRONE 0.35 MG PO TABS: 1.0000 | ORAL_TABLET | Freq: Every day | ORAL | Status: DC

## 2013-04-14 NOTE — Telephone Encounter (Signed)
Patient has appointment scheduled with Dr. Marin Olp in 05/13/13.

## 2013-04-14 NOTE — Progress Notes (Signed)
Patient ID: Ashley Mosley, female   DOB: Dec 07, 1960, 53 y.o.   MRN: [id number] GYNECOLOGY  VISIT   HPI: 53 y.o.   Married  Caucasian  female   [phone] with Patient's last menstrual period was 04/02/2013.   here for  Follow-up of bleeding. Took Provera 10 mg for 10 days.  Bleeding stopped. Had bleeding again on the 7th day, and it was heavy for several days and then stopped.  Never increased Provera to 20 mg daily.  Last day of any bleeding was April 03/10/13. "I don't want to bleed like that again."  Patient continues to state, "Something is wrong."  Reports bloating.   Had pelvic ultrasound on 04/08/13 in office showing fibroid 3.6 cm, EMS thin, left ovarian follicle 2.1 x 1.9 cm.  Essure coils noted.   Limited abdominal ultrasound on 04/10/13 showing rib in the area of potential concern.   Had CT scan on March 25th in [place].  Showed multiple fibroids.  Thyroid ultrasound showing nodules and some lymphadenopathy.  PCP ordering.   Sees Dr. Marin Olp in May for leukocytosis.   GYNECOLOGIC HISTORY: Patient's last menstrual period was 04/02/2013. Contraception: Essure   Menopausal hormone therapy: none        OB History   Grav Para Term Preterm Abortions TAB SAB Ect Mult Living   5 3 2 1 2 2    3          Patient Active Problem List   Diagnosis Date Noted  . Elevated hemoglobin A1c 12/11/2012  . Thrombocytosis 12/11/2012  . Uterine fibroid 11/07/2012  . Endometrial mass 11/07/2012  . Anxiety state, unspecified 07/22/2012  . Paranoid schizophrenia 07/22/2012  . Major depressive disorder, single episode, severe, specified as with psychotic behavior 07/22/2012    Past Medical History  Diagnosis Date  . Abnormal uterine bleeding   . Anemia   . Anxiety   . Blood transfusion without reported diagnosis     age 10  . Depression   . Dysmenorrhea   . Endometriosis   . Fibroid 2012  . [place] spotted fever 1970    hospitalized x 1 month, had a blood transfusion  .  Abnormal Pap smear 2007 2012  . Gastric ulcer 2014  . Thrombocytosis 2014  . Elevated hemoglobin A1c 2014    Past Surgical History  Procedure Laterality Date  . Cesarean section  1993  . Pelvic laparoscopy    . Colposcopy  2010  . Essure tubal ligation  2010  . Hysteroscopy w/d&c N/A 12/17/2012    Procedure: DILATATION AND CURETTAGE /HYSTEROSCOPY AND RESECTOSCOPE;  Surgeon: Jamey Reas de Berton Lan, MD;  Location: [facility];  Service: Gynecology;  Laterality: N/A;  . Tubal ligation      Essure    Current Outpatient Prescriptions  Medication Sig Dispense Refill  . cyanocobalamin 1000 MCG tablet Take 100 mcg by mouth daily.      Marland Kitchen Dexlansoprazole 30 MG capsule Take 1 capsule by mouth daily.      . rifaximin (XIFAXAN) 550 MG TABS tablet Take 550 mg by mouth 2 (two) times daily.      . sucralfate (CARAFATE) 1 GM/10ML suspension Take 1 g by mouth 4 (four) times daily.      Marland Kitchen dicyclomine (BENTYL) 20 MG tablet Take 20 mg by mouth 3 (three) times daily.       No current facility-administered medications for this visit.     ALLERGIES: Codeine  Family History  Problem Relation Age of Onset  .  Diabetes Father     History   Social History  . Marital Status: Married    Spouse Name: N/A    Number of Children: N/A  . Years of Education: N/A   Occupational History  . Not on file.   Social History Main Topics  . Smoking status: Current Every Day Smoker -- 1.00 packs/day for 35 years    Types: Cigarettes  . Smokeless tobacco: Never Used  . Alcohol Use: 1.5 oz/week    3 drink(s) per week     Comment: 2-3 drinks a week (vodka)  . Drug Use: No  . Sexual Activity: Yes    Partners: Male    Birth Control/ Protection: Other-see comments     Comment: Essure   Other Topics Concern  . Not on file   Social History Narrative  . No narrative on file    ROS:  Pertinent items are noted in HPI.  PHYSICAL EXAMINATION:    BP 100/70  Pulse 80  Ht 5' 3.5" (1.613 m)  Wt 116  lb (52.617 kg)  BMI 20.22 kg/m2  LMP 04/02/2013     General appearance: alert, cooperative and appears stated age   Pelvic: External genitalia:  no lesions              Urethra:  normal appearing urethra with no masses, tenderness or lesions              Bartholins and Skenes: normal                 Vagina: normal appearing vagina with normal color and discharge, no lesions              Cervix: normal appearance                   Bimanual Exam:  Uterus:  uterus is normal size, shape, consistency and nontender                                      Adnexa: normal adnexa in size, nontender and no masses                                        ASSESSMENT  Menorrhagia. Uterine fibroid. Status post hysteroscopy with dilation and curettage for polyps.  PLAN  [facility] for 6 months.  Instructed in use. Return in 2 months for a recheck.   An After Visit Summary was printed and given to the patient.  _25_____ minutes face to face time of which over 50% was spent in counseling.

## 2013-04-14 NOTE — Patient Instructions (Signed)
Norethindrone tablets (contraception) What is this medicine? NORETHINDRONE (nor eth IN drone) is an oral contraceptive. The product contains a female hormone known as a progestin. It is used to prevent pregnancy. This medicine may be used for other purposes; ask your health care provider or pharmacist if you have questions. COMMON BRAND NAME(S): Camila, Errin , Rice, [place], [place] , [place], Nor-QD, Nora-BE, [facility] What should I tell my health care provider before I take this medicine? They need to know if you have any of these conditions: -blood vessel disease or blood clots -breast, cervical, or vaginal cancer -diabetes -heart disease -kidney disease -liver disease -mental depression -migraine -seizures -stroke -vaginal bleeding -an unusual or allergic reaction to norethindrone, other medicines, foods, dyes, or preservatives -pregnant or trying to get pregnant -breast-feeding How should I use this medicine? Take this medicine by mouth with a glass of water. You may take it with or without food. Follow the directions on the prescription label. Take this medicine at the same time each day and in the order directed on the package. Do not take your medicine more often than directed. Contact your pediatrician regarding the use of this medicine in children. Special care may be needed. This medicine has been used in female children who have started having menstrual periods. A patient package insert for the product will be given with each prescription and refill. Read this sheet carefully each time. The sheet may change frequently. Overdosage: If you think you have taken too much of this medicine contact a poison control center or emergency room at once. NOTE: This medicine is only for you. Do not share this medicine with others. What if I miss a dose? Try not to miss a dose. Every time you miss a dose or take a dose late your chance of pregnancy increases. When 1 pill is missed  (even if only 3 hours late), take the missed pill as soon as possible and continue taking a pill each day at the regular time (use a back up method of birth control for the next 48 hours). If more than 1 dose is missed, use an additional birth control method for the rest of your pill pack until menses occurs. Contact your health care professional if more than 1 dose has been missed. What may interact with this medicine? Do not take this medicine with any of the following medications: -amprenavir or fosamprenavir -bosentan This medicine may also interact with the following medications: -antibiotics or medicines for infections, especially rifampin, rifabutin, rifapentine, and griseofulvin, and possibly penicillins or tetracyclines -aprepitant -barbiturate medicines, such as phenobarbital -carbamazepine -felbamate -modafinil -oxcarbazepine -phenytoin -ritonavir or other medicines for HIV infection or AIDS -St. John's wort -topiramate This list may not describe all possible interactions. Give your health care provider a list of all the medicines, herbs, non-prescription drugs, or dietary supplements you use. Also tell them if you smoke, drink alcohol, or use illegal drugs. Some items may interact with your medicine. What should I watch for while using this medicine? Visit your doctor or health care professional for regular checks on your progress. You will need a regular breast and pelvic exam and Pap smear while on this medicine. Use an additional method of birth control during the first cycle that you take these tablets. If you have any reason to think you are pregnant, stop taking this medicine right away and contact your doctor or health care professional. If you are taking this medicine for hormone related problems, it may take several  cycles of use to see improvement in your condition. This medicine does not protect you against HIV infection (AIDS) or any other sexually transmitted  diseases. What side effects may I notice from receiving this medicine? Side effects that you should report to your doctor or health care professional as soon as possible: -breast tenderness or discharge -pain in the abdomen, chest, groin or leg -severe headache -skin rash, itching, or hives -sudden shortness of breath -unusually weak or tired -vision or speech problems -yellowing of skin or eyes Side effects that usually do not require medical attention (report to your doctor or health care professional if they continue or are bothersome): -changes in sexual desire -change in menstrual flow -facial hair growth -fluid retention and swelling -headache -irritability -nausea -weight gain or loss This list may not describe all possible side effects. Call your doctor for medical advice about side effects. You may report side effects to FDA at 1-800-FDA-1088. Where should I keep my medicine? Keep out of the reach of children. Store at room temperature between 15 and 30 degrees C (59 and 86 degrees F). Throw away any unused medicine after the expiration date. NOTE: This sheet is a summary. It may not cover all possible information. If you have questions about this medicine, talk to your doctor, pharmacist, or health care provider.  2014, Elsevier/Gold Standard. (2011-09-15 16:41:35)

## 2013-04-17 ENCOUNTER — Other Ambulatory Visit: Payer: BC Managed Care – PPO

## 2013-04-17 ENCOUNTER — Ambulatory Visit
Admission: RE | Admit: 2013-04-17 | Discharge: 2013-04-17 | Disposition: A | Discharge status: Home / Self Care | Payer: Self-pay | Source: Ambulatory Visit | Attending: Obstetrics and Gynecology | Admitting: Obstetrics and Gynecology

## 2013-04-17 ENCOUNTER — Other Ambulatory Visit: Payer: BC Managed Care – PPO | Admitting: Obstetrics and Gynecology

## 2013-04-17 DIAGNOSIS — R921 Mammographic calcification found on diagnostic imaging of breast: Secondary | ICD-10-CM

## 2013-04-17 DIAGNOSIS — N6320 Unspecified lump in the left breast, unspecified quadrant: Secondary | ICD-10-CM

## 2013-04-21 NOTE — Telephone Encounter (Signed)
Left message on vm for pt to give me a call regarding her medical records release form.

## 2013-04-25 DIAGNOSIS — Z0289 Encounter for other administrative examinations: Secondary | ICD-10-CM

## 2013-04-28 ENCOUNTER — Other Ambulatory Visit: Payer: Self-pay

## 2013-05-07 ENCOUNTER — Telehealth: Payer: Self-pay | Admitting: Hematology & Oncology

## 2013-05-07 NOTE — Telephone Encounter (Signed)
Spoke w NEW PATIENT today to remind them of their appointment with Dr. Ennever. Also, advised them to bring all meds and insurance information. ° °

## 2013-05-08 ENCOUNTER — Ambulatory Visit (HOSPITAL_BASED_OUTPATIENT_CLINIC_OR_DEPARTMENT_OTHER): Payer: BC Managed Care – PPO

## 2013-05-08 ENCOUNTER — Other Ambulatory Visit (HOSPITAL_BASED_OUTPATIENT_CLINIC_OR_DEPARTMENT_OTHER): Payer: BC Managed Care – PPO | Admitting: Lab

## 2013-05-08 ENCOUNTER — Ambulatory Visit: Payer: BC Managed Care – PPO

## 2013-05-08 ENCOUNTER — Encounter: Payer: Self-pay | Admitting: Hematology & Oncology

## 2013-05-08 ENCOUNTER — Ambulatory Visit (HOSPITAL_BASED_OUTPATIENT_CLINIC_OR_DEPARTMENT_OTHER): Payer: BC Managed Care – PPO | Admitting: Hematology & Oncology

## 2013-05-08 VITALS — BP 137/67 | HR 75 | Temp 97.9°F | Resp 14 | Ht 62.0 in | Wt 114.0 lb

## 2013-05-08 DIAGNOSIS — D473 Essential (hemorrhagic) thrombocythemia: Secondary | ICD-10-CM

## 2013-05-08 DIAGNOSIS — R590 Localized enlarged lymph nodes: Secondary | ICD-10-CM

## 2013-05-08 DIAGNOSIS — D75839 Thrombocytosis, unspecified: Secondary | ICD-10-CM

## 2013-05-08 DIAGNOSIS — D72829 Elevated white blood cell count, unspecified: Secondary | ICD-10-CM

## 2013-05-08 DIAGNOSIS — D693 Immune thrombocytopenic purpura: Secondary | ICD-10-CM

## 2013-05-08 DIAGNOSIS — E538 Deficiency of other specified B group vitamins: Secondary | ICD-10-CM

## 2013-05-08 DIAGNOSIS — R1012 Left upper quadrant pain: Secondary | ICD-10-CM

## 2013-05-08 DIAGNOSIS — F17200 Nicotine dependence, unspecified, uncomplicated: Secondary | ICD-10-CM

## 2013-05-08 DIAGNOSIS — R0781 Pleurodynia: Secondary | ICD-10-CM

## 2013-05-08 LAB — CBC WITH DIFFERENTIAL (CANCER CENTER ONLY)
BASO#: 0 10*3/uL (ref 0.0–0.2)
BASO%: 0.3 % (ref 0.0–2.0)
EOS ABS: 0.2 10*3/uL (ref 0.0–0.5)
EOS%: 2 % (ref 0.0–7.0)
HCT: 35.7 % (ref 34.8–46.6)
HGB: 12.2 g/dL (ref 11.6–15.9)
LYMPH#: 2.1 10*3/uL (ref 0.9–3.3)
LYMPH%: 19.6 % (ref 14.0–48.0)
MCH: 33.1 pg (ref 26.0–34.0)
MCHC: 34.2 g/dL (ref 32.0–36.0)
MCV: 97 fL (ref 81–101)
MONO#: 0.8 10*3/uL (ref 0.1–0.9)
MONO%: 7.4 % (ref 0.0–13.0)
NEUT%: 70.7 % (ref 39.6–80.0)
NEUTROS ABS: 7.6 10*3/uL — AB (ref 1.5–6.5)
PLATELETS: 467 10*3/uL — AB (ref 145–400)
RBC: 3.69 10*6/uL — ABNORMAL LOW (ref 3.70–5.32)
RDW: 12.1 % (ref 11.1–15.7)
WBC: 10.8 10*3/uL — AB (ref 3.9–10.0)

## 2013-05-08 LAB — CHCC SATELLITE - SMEAR

## 2013-05-08 MED ORDER — CYANOCOBALAMIN 1000 MCG/ML IJ SOLN
INTRAMUSCULAR | Status: AC
  Filled 2013-05-08: qty 1

## 2013-05-08 NOTE — Patient Instructions (Signed)
You Can Quit Smoking If you are ready to quit smoking or are thinking about it, congratulations! You have chosen to help yourself be healthier and live longer! There are lots of different ways to quit smoking. Nicotine gum, nicotine patches, a nicotine inhaler, or nicotine nasal spray can help with physical craving. Hypnosis, support groups, and medicines help break the habit of smoking. TIPS TO GET OFF AND STAY OFF CIGARETTES  Learn to predict your moods. Do not let a bad situation be your excuse to have a cigarette. Some situations in your life might tempt you to have a cigarette.  Ask friends and co-workers not to smoke around you.  Make your home smoke-free.  Never have "just one" cigarette. It leads to wanting another and another. Remind yourself of your decision to quit.  On a card, make a list of your reasons for not smoking. Read it at least the same number of times a day as you have a cigarette. Tell yourself everyday, "I do not want to smoke. I choose not to smoke."  Ask someone at home or work to help you with your plan to quit smoking.  Have something planned after you eat or have a cup of coffee. Take a walk or get other exercise to perk you up. This will help to keep you from overeating.  Try a relaxation exercise to calm you down and decrease your stress. Remember, you may be tense and nervous the first two weeks after you quit. This will pass.  Find new activities to keep your hands busy. Play with a pen, coin, or rubber band. Doodle or draw things on paper.  Brush your teeth right after eating. This will help cut down the craving for the taste of tobacco after meals. You can try mouthwash too.  Try gum, breath mints, or diet candy to keep something in your mouth. IF YOU SMOKE AND WANT TO QUIT:  Do not stock up on cigarettes. Never buy a carton. Wait until one pack is finished before you buy another.  Never carry cigarettes with you at work or at home.  Keep cigarettes  as far away from you as possible. Leave them with someone else.  Never carry matches or a lighter with you.  Ask yourself, "Do I need this cigarette or is this just a reflex?"  Bet with someone that you can quit. Put cigarette money in a piggy bank every morning. If you smoke, you give up the money. If you do not smoke, by the end of the week, you keep the money.  Keep trying. It takes 21 days to change a habit!  Talk to your doctor about using medicines to help you quit. These include nicotine replacement gum, lozenges, or skin patches. Document Released: 10/22/2008 Document Revised: 03/20/2011 Document Reviewed: 10/22/2008 ExitCare Patient Information 2014 [employer].  

## 2013-05-08 NOTE — Progress Notes (Signed)
Referral MD  Reason for Referral: Leukocytosis and thrombocytosis   Chief Complaint  Patient presents with  . NEW PATIENT  : I want you to tell me what is wrong with me  HPI: Ashley Mosley is a very nice 53 year old white female. She has quite a few medical issues. She apparently had a [facility] spotted fever back when she was 53 years old. She says she will die from this.  She has been having a lot of issues with not feeling well. She's been to the emergency room. She's been to multiple doctors. She's been having this pain in the left side. She says her abdomen occasionally swells up. She's had ultrasounds. She's had no obvious abnormality found. Her last his x-ray was in July. She has had a CAT scan done but not in our system.  She says that she has a low B12 level. She is taking oral B12.  She's had colonoscopies and upper endoscopies. She's had small bowel follow-through is. Again am I sure if anything is ever shown a problem.  She has been noted to have some leukocytosis and thrombocytosis. Going through her lab work, a year ago in July and her white cell count was 14 and platelet count 421. In October but count was 512. White cell count was 11.5.  The last blood count I have on her back in December was  A white cell count 13.9 and and a platelet count of 2 398,000. She's never been anemic. Her MCV has been normal. The rare time that she had a white cell differential, everything was okay.  Again, she was referred to the [facility] with the concern over a hematologic issue.  She still smokes a pack per day. She started back which is 17. She had her first child when she was 68 years old. She does drink alcohol but not a lot from what I gather. She has no recreational drug use. She is not working. She wants to go back to school.  There's been no rashes. She's had a thyroid goiter. She's had a mammogram in the past year which was okay. She's had no swallowing difficulties. She's had no  rashes. There's been no leg swelling. She's had occasional headaches.  Is no obvious occupational exposures.         .   Past Medical History  Diagnosis Date  . Abnormal uterine bleeding   . Anemia   . Anxiety   . Blood transfusion without reported diagnosis     age 42  . Depression   . Dysmenorrhea   . Endometriosis   . Fibroid 2012  . [place] spotted fever 1970    hospitalized x 1 month, had a blood transfusion  . Abnormal Pap smear 2007 2012  . Gastric ulcer 2014  . Thrombocytosis 2014  . Elevated hemoglobin A1c 2014  :  Past Surgical History  Procedure Laterality Date  . Cesarean section  1993  . Pelvic laparoscopy    . Colposcopy  2010  . Essure tubal ligation  2010  . Hysteroscopy w/d&c N/A 12/17/2012    Procedure: DILATATION AND CURETTAGE /HYSTEROSCOPY AND RESECTOSCOPE;  Surgeon: Jamey Reas de Berton Lan, MD;  Location: [facility];  Service: Gynecology;  Laterality: N/A;  . Tubal ligation      Essure  :  Current outpatient prescriptions:cyanocobalamin 1000 MCG tablet, Take 100 mcg by mouth daily., Disp: , Rfl: ;  Dexlansoprazole 30 MG capsule, Take 1 capsule by mouth daily., Disp: ,  Rfl: ;  hyoscyamine (LEVSIN SL) 0.125 MG SL tablet, Take 0.125 mg by mouth 3 (three) times daily with meals. DOES NOT TAKE AS DIRECTED, Disp: , Rfl:  norethindrone (MICRONOR,CAMILA,ERRIN) 0.35 MG tablet, Take 1 tablet (0.35 mg total) by mouth daily., Disp: 3 Package, Rfl: 1;  Probiotic Product (RESTORA PO), Take by mouth every morning., Disp: , Rfl: :  :  Allergies  Allergen Reactions  . Codeine Nausea Only  :  Family History  Problem Relation Age of Onset  . Diabetes Father   :  History   Social History  . Marital Status: Married    Spouse Name: N/A    Number of Children: N/A  . Years of Education: N/A   Occupational History  . Not on file.   Social History Main Topics  . Smoking status: Current Every Day Smoker -- 1.50 packs/day for 35 years     Types: Cigarettes    Start date: 12/09/1978  . Smokeless tobacco: Never Used     Comment: 05-08-13  still smoking  . Alcohol Use: 1.5 oz/week    3 drink(s) per week     Comment: 2-3 drinks a week (vodka)  . Drug Use: No  . Sexual Activity: Yes    Partners: Male    Birth Control/ Protection: Other-see comments     Comment: Essure   Other Topics Concern  . Not on file   Social History Narrative  . No narrative on file  :  Pertinent items are noted in HPI.  Exam: @[facility] @  [place] white female in no obvious distress. Vital signs show temperature of 97.9. Pulse 75. Blood pressure 137/67. Weight is 114 pounds. Head exam is no ocular or oral lesions. There are no palpable cervical or supra-clavicular lymph nodes. Thyroid is nonpalpable. There is no scleral icterus. Lungs are clear. Cardiac exam regular rate and rhythm with no murmurs rubs or bruits. Abdomen is soft. It is non-distended. There is no fluid wave. There may be some tenderness in the left side. Spleen tip may be palpable with deep inspiration. There is no hepatomegaly. Back exam no tenderness over the spine ribs or his. Axillary exam shows some right axillary lymph nodes. I think one might measure 2 or 3 cm. There is no left axillary lymphadenopathy. Extremities shows no clubbing cyanosis or edema. She has good range of motion of her joints. No joint swelling is noted. Has good muscle strength. Skin exam no rashes. Neurological exam is nonfocal.    Recent Labs  05/08/13 1522  WBC 10.8*  HGB 12.2  HCT 35.7  PLT 467*   No results found for this basename: NA, K, CL, CO2, GLUCOSE, BUN, CREATININE, CALCIUM,  in the last 72 hours  Blood smear review: Normochromic and normocytic white blood cells. There are no nucleated red cells. There are no teardrop cells. She has no rouleau formation. I see no schistocytes or spherocytes. I see no target cells. White cells are normal morphology maturation. There are no hypersegmented polys. I  see no immature myeloid or lymphoid cells. There are no atypical lymphocytes. There are no blasts. Platelets are mildly increased in number. There are no large platelets. Platelets were well granulated.  Pathology: No data available     Assessment Plan: Ashley Mosley is a 53 year old white female. She has multiple medical issues. There are psychological issues. She has this left-sided upper abdominal pain which she is very worried about. Her spleen might be enlarged. I am more worried about the right  axilla. I think there are some enlarged lymph nodes there.  Need to have a CT scan done. I will set this up.  I also think she needs to have a bone scan done. I do not know if she has any crack or a fracture or other issue.  I did not see anything under the microscope with her blood smear that looked suspicious. I have to do wonder if the leukocytosis and thrombocytosis are reactive. Given her current "state" we may end up having to do a bone marrow test on her in order to reassure her that there is nothing going on with her blood.  This is a very complicated case. Ashley Mosley has a lot of determination in trying to find what is wrong. I do not know if we will find anything but at least we will look.  Because of her reported a low B12 level, I did give her a B12 shot today.  We will get her scans set up for next week. I will call her. We will see if she has to come back to see [place].

## 2013-05-09 ENCOUNTER — Telehealth: Payer: Self-pay | Admitting: Hematology & Oncology

## 2013-05-09 MED ORDER — CYANOCOBALAMIN 1000 MCG/ML IJ SOLN
1000.0000 ug | Freq: Once | INTRAMUSCULAR | Status: AC
  Administered 2013-05-09: 1000 ug via INTRAMUSCULAR

## 2013-05-09 NOTE — Telephone Encounter (Signed)
Pt aware of 5-11 bone scan per MD date ok. Pt also aware of 5-6 CT to be NPO 4 hrs and to drink contrast

## 2013-05-13 ENCOUNTER — Ambulatory Visit: Payer: Self-pay | Admitting: Hematology & Oncology

## 2013-05-13 ENCOUNTER — Ambulatory Visit: Payer: Self-pay

## 2013-05-13 ENCOUNTER — Other Ambulatory Visit: Payer: Self-pay | Admitting: Lab

## 2013-05-14 ENCOUNTER — Ambulatory Visit (HOSPITAL_BASED_OUTPATIENT_CLINIC_OR_DEPARTMENT_OTHER)
Admission: RE | Admit: 2013-05-14 | Discharge: 2013-05-14 | Disposition: A | Discharge status: Home / Self Care | Payer: 59 | Source: Ambulatory Visit | Attending: Hematology & Oncology | Admitting: Hematology & Oncology

## 2013-05-14 ENCOUNTER — Encounter (HOSPITAL_BASED_OUTPATIENT_CLINIC_OR_DEPARTMENT_OTHER): Payer: Self-pay

## 2013-05-14 DIAGNOSIS — E041 Nontoxic single thyroid nodule: Secondary | ICD-10-CM | POA: Diagnosis not present

## 2013-05-14 DIAGNOSIS — R0781 Pleurodynia: Secondary | ICD-10-CM

## 2013-05-14 DIAGNOSIS — F17200 Nicotine dependence, unspecified, uncomplicated: Secondary | ICD-10-CM | POA: Diagnosis not present

## 2013-05-14 DIAGNOSIS — D72829 Elevated white blood cell count, unspecified: Secondary | ICD-10-CM

## 2013-05-14 DIAGNOSIS — R590 Localized enlarged lymph nodes: Secondary | ICD-10-CM

## 2013-05-14 DIAGNOSIS — R634 Abnormal weight loss: Secondary | ICD-10-CM | POA: Diagnosis present

## 2013-05-14 MED ORDER — IOHEXOL 300 MG/ML  SOLN
100.0000 mL | Freq: Once | INTRAMUSCULAR | Status: AC | PRN
  Administered 2013-05-14: 100 mL via INTRAVENOUS

## 2013-05-19 ENCOUNTER — Encounter (HOSPITAL_COMMUNITY)
Admission: RE | Admit: 2013-05-19 | Discharge: 2013-05-19 | Disposition: A | Discharge status: Home / Self Care | Payer: 59 | Source: Ambulatory Visit | Attending: Hematology & Oncology | Admitting: Hematology & Oncology

## 2013-05-19 ENCOUNTER — Ambulatory Visit (HOSPITAL_COMMUNITY)
Admission: RE | Admit: 2013-05-19 | Discharge: 2013-05-19 | Disposition: A | Discharge status: Home / Self Care | Payer: 59 | Source: Ambulatory Visit | Attending: Hematology & Oncology | Admitting: Hematology & Oncology

## 2013-05-19 DIAGNOSIS — R079 Chest pain, unspecified: Secondary | ICD-10-CM | POA: Diagnosis present

## 2013-05-19 DIAGNOSIS — D72829 Elevated white blood cell count, unspecified: Secondary | ICD-10-CM | POA: Insufficient documentation

## 2013-05-19 DIAGNOSIS — R0781 Pleurodynia: Secondary | ICD-10-CM

## 2013-05-19 DIAGNOSIS — R590 Localized enlarged lymph nodes: Secondary | ICD-10-CM

## 2013-05-19 MED ORDER — TECHNETIUM TC 99M MEDRONATE IV KIT
26.9000 | PACK | Freq: Once | INTRAVENOUS | Status: AC | PRN
  Administered 2013-05-19: 26.9 via INTRAVENOUS

## 2013-05-21 NOTE — Addendum Note (Signed)
Addended by: Volanda Napoleon on: 05/21/2013 06:18 PM   Modules accepted: Orders

## 2013-05-22 ENCOUNTER — Telehealth: Payer: Self-pay | Admitting: Hematology & Oncology

## 2013-05-22 NOTE — Telephone Encounter (Signed)
Pt aware of 6-24 appointment

## 2013-06-18 ENCOUNTER — Ambulatory Visit: Payer: BC Managed Care – PPO | Admitting: Obstetrics and Gynecology

## 2013-06-18 ENCOUNTER — Telehealth: Payer: Self-pay | Admitting: Obstetrics and Gynecology

## 2013-06-18 NOTE — Telephone Encounter (Signed)
Trying to call patient about missed appointment and get her rescheduled. lmtcb

## 2013-06-18 NOTE — Telephone Encounter (Signed)
Thank you for the update!

## 2013-07-01 ENCOUNTER — Other Ambulatory Visit: Payer: Self-pay | Admitting: Nurse Practitioner

## 2013-07-01 DIAGNOSIS — D473 Essential (hemorrhagic) thrombocythemia: Secondary | ICD-10-CM

## 2013-07-01 DIAGNOSIS — D75839 Thrombocytosis, unspecified: Secondary | ICD-10-CM

## 2013-07-02 ENCOUNTER — Ambulatory Visit (HOSPITAL_BASED_OUTPATIENT_CLINIC_OR_DEPARTMENT_OTHER): Payer: 59 | Admitting: Hematology & Oncology

## 2013-07-02 ENCOUNTER — Other Ambulatory Visit (HOSPITAL_BASED_OUTPATIENT_CLINIC_OR_DEPARTMENT_OTHER): Payer: 59 | Admitting: Lab

## 2013-07-02 VITALS — BP 115/67 | HR 105 | Temp 98.5°F | Resp 20 | Ht 64.0 in | Wt 110.0 lb

## 2013-07-02 DIAGNOSIS — F17200 Nicotine dependence, unspecified, uncomplicated: Secondary | ICD-10-CM

## 2013-07-02 DIAGNOSIS — D473 Essential (hemorrhagic) thrombocythemia: Secondary | ICD-10-CM

## 2013-07-02 DIAGNOSIS — D72829 Elevated white blood cell count, unspecified: Secondary | ICD-10-CM

## 2013-07-02 DIAGNOSIS — D75839 Thrombocytosis, unspecified: Secondary | ICD-10-CM

## 2013-07-02 LAB — CBC WITH DIFFERENTIAL (CANCER CENTER ONLY)
BASO#: 0 10*3/uL (ref 0.0–0.2)
BASO%: 0.2 % (ref 0.0–2.0)
EOS%: 0.4 % (ref 0.0–7.0)
Eosinophils Absolute: 0.1 10*3/uL (ref 0.0–0.5)
HCT: 40.9 % (ref 34.8–46.6)
HGB: 13.9 g/dL (ref 11.6–15.9)
LYMPH#: 1.6 10*3/uL (ref 0.9–3.3)
LYMPH%: 12.4 % — ABNORMAL LOW (ref 14.0–48.0)
MCH: 32.6 pg (ref 26.0–34.0)
MCHC: 34 g/dL (ref 32.0–36.0)
MCV: 96 fL (ref 81–101)
MONO#: 0.6 10*3/uL (ref 0.1–0.9)
MONO%: 4.9 % (ref 0.0–13.0)
NEUT%: 82.1 % — ABNORMAL HIGH (ref 39.6–80.0)
NEUTROS ABS: 10.6 10*3/uL — AB (ref 1.5–6.5)
Platelets: 423 10*3/uL — ABNORMAL HIGH (ref 145–400)
RBC: 4.26 10*6/uL (ref 3.70–5.32)
RDW: 12.9 % (ref 11.1–15.7)
WBC: 13 10*3/uL — ABNORMAL HIGH (ref 3.9–10.0)

## 2013-07-02 LAB — CHCC SATELLITE - SMEAR

## 2013-07-03 LAB — LACTATE DEHYDROGENASE: LDH: 214 U/L (ref 94–250)

## 2013-07-03 LAB — VITAMIN B12: Vitamin B-12: 228 pg/mL (ref 211–911)

## 2013-07-03 LAB — IRON AND TIBC CHCC
%SAT: 14 % — AB (ref 21–57)
Iron: 64 ug/dL (ref 41–142)
TIBC: 449 ug/dL — ABNORMAL HIGH (ref 236–444)
UIBC: 385 ug/dL — AB (ref 120–384)

## 2013-07-03 LAB — FERRITIN CHCC: Ferritin: 17 ng/ml (ref 9–269)

## 2013-07-03 NOTE — Telephone Encounter (Signed)
If we have already called her to schedule, we do not need to contact her further.  I know that she is having several appointments currently for her health care.

## 2013-07-03 NOTE — Telephone Encounter (Signed)
Dr Quincy Simmonds do i need to contact pt about her missed appt for the reck?

## 2013-07-03 NOTE — Progress Notes (Signed)
Hematology and Oncology Follow Up Visit  Ashley Mosley [id number] 03-19-1960 53 y.o. 07/03/2013   Principle Diagnosis:   Chronic leukocytosis and thrombocytosis  Current Therapy:    Observation     Interim History:  Ms.  Mosley is back for a second office visit. We first saw her back in April. We did scans on her. The scans were all negative. All her lab work was negative. We did a bone scan on her. This was unremarkable. She has some slight uptake at T8 thought to be arthritic.  She feels a lot better. I am still not sure as to why there is the leukocytosis and probable cytosis. I think that this is more reactive. She does smoke quite a bit. I think the leukocytosis might be from her smoking.  Again, she feels better. She does not have much of an appetite. She is also a little bit of weight since I last saw her.  There is no bleeding. There is no rashes. There is no cough. She says she does have some congestion but only in the morning.  She still has discomfort in the left upper quadrant. This is. I sloughed over one of the ribs. Medications: Current outpatient prescriptions:norethindrone (MICRONOR,CAMILA,ERRIN) 0.35 MG tablet, Take 1 tablet (0.35 mg total) by mouth daily., Disp: 3 Package, Rfl: 1;  cyanocobalamin 1000 MCG tablet, Take 100 mcg by mouth daily., Disp: , Rfl: ;  Dexlansoprazole 30 MG capsule, Take 1 capsule by mouth daily., Disp: , Rfl:  hyoscyamine (LEVSIN SL) 0.125 MG SL tablet, Take 0.125 mg by mouth 3 (three) times daily with meals. DOES NOT TAKE AS DIRECTED, Disp: , Rfl: ;  Probiotic Product (RESTORA PO), Take by mouth every morning., Disp: , Rfl:   Allergies:  Allergies  Allergen Reactions  . Codeine Nausea Only    Past Medical History, Surgical history, Social history, and Family History were reviewed and updated.  Review of Systems: As above  Physical Exam:  height is 5\' 4"  (1.626 m) and weight is 110 lb (49.896 kg). Her oral temperature is 98.5 F (36.9 C).  Her blood pressure is 115/67 and her pulse is 105. Her respiration is 20.   Lungs are clear. Lymph nodes not palpable. Cardiac exam regular in rhythm. Abdomen soft. There is no palpable liver spleen. There is also tenderness in the left upper quadrant. Again this appears to be localized. Back exam no tenderness over the spine. Extremities shows no clubbing cyanosis or edema. Skin exam no rashes.  Lab Results  Component Value Date   WBC 13.0* 07/02/2013   HGB 13.9 07/02/2013   HCT 40.9 07/02/2013   MCV 96 07/02/2013   PLT 423* 07/02/2013     Chemistry      Component Value Date/Time   NA 138 12/17/2012 0910   K 4.6 12/17/2012 0910   CL 103 12/17/2012 0910   CO2 27 12/17/2012 0910   BUN 8 12/17/2012 0910   CREATININE 0.62 12/17/2012 0910   CREATININE 0.64 11/07/2012 1124      Component Value Date/Time   CALCIUM 9.4 12/17/2012 0910   ALKPHOS 61 11/07/2012 1124   AST 23 11/07/2012 1124   ALT 12 11/07/2012 1124   BILITOT 0.4 11/07/2012 1124         Impression and Plan: Ashley Mosley is general female. She's had this leukocytosis and thrombocytosis for 6 years. She is very good about keeping records. I am thankful for this. Again this leukocytosis and thrombocytosis has be reactive. However think  if there is a bone marrow problem, after 6 years, this would be a lot worse and that she would have other signs or symptoms.  For now, we can continue to follow her along. I really don't see any indication for a bone marrow test. I did talk to her about a bone marrow test. Again she is tired of seeing doctors. I told on her status.  I looked at her blood smear. I did not see any immature myeloid cells. I did not see any unusual platelets. She had no large platelets. Red cells were normal.  We will plan to get her back in 3 months.   Volanda Napoleon, MD 6/25/20156:14 AM

## 2013-07-09 DIAGNOSIS — F488 Other specified nonpsychotic mental disorders: Secondary | ICD-10-CM

## 2013-07-09 HISTORY — DX: Other specified nonpsychotic mental disorders: F48.8

## 2013-07-10 ENCOUNTER — Telehealth: Payer: Self-pay | Admitting: *Deleted

## 2013-07-10 NOTE — Telephone Encounter (Signed)
Message copied by Rico Ala on Thu Jul 10, 2013  9:30 AM ------      Message from: Burney Gauze R      Created: Wed Jul 09, 2013  7:22 PM       I left a phone message that her iron was on the low side and that we might be a would get her feeling better by giving her IV iron. I explained to her how this is done. I told her that hopefully she may need one dose.            She also has a borderline low vitamin B 12 level. I told her that she might benefit from a injection of vitamin B12.            I told her to give [place] a call back to let us know if she would be interested in doing this and if so we can get this set up for her.            Pete ------

## 2013-07-23 ENCOUNTER — Telehealth: Payer: Self-pay | Admitting: Hematology & Oncology

## 2013-07-23 NOTE — Telephone Encounter (Signed)
Pt aware of 7-16 inj and transferred to RN for questions about iron infusion

## 2013-07-24 ENCOUNTER — Ambulatory Visit: Payer: 59

## 2013-07-28 ENCOUNTER — Ambulatory Visit: Payer: Self-pay

## 2013-09-11 ENCOUNTER — Telehealth: Payer: Self-pay | Admitting: Obstetrics and Gynecology

## 2013-09-11 NOTE — Telephone Encounter (Signed)
Spoke with patient. Moved appointment from 9/18 at 9/10 at 2:45pm with Dr.Silva as there was an opening.   Routing to provider for final review. Patient agreeable to disposition. Will close encounter

## 2013-09-11 NOTE — Telephone Encounter (Signed)
Pt states she has been having hot flashes for the last two months and wants to talk to silva about it. Pt also wants to have some std testing done to make sure everything is okay with her says that some things have happened in her marriage and she is unsure if her husband has cheated or not so she would like to make sure that she is okay. I scheduled pt with silva for 09/26/13 at 4:00  For std testing but i think pt would rather come in sooner if possible.

## 2013-09-17 ENCOUNTER — Encounter: Payer: Self-pay | Admitting: Obstetrics and Gynecology

## 2013-09-18 ENCOUNTER — Ambulatory Visit (INDEPENDENT_AMBULATORY_CARE_PROVIDER_SITE_OTHER): Payer: 59 | Admitting: Obstetrics and Gynecology

## 2013-09-18 ENCOUNTER — Encounter: Payer: Self-pay | Admitting: Obstetrics and Gynecology

## 2013-09-18 VITALS — BP 122/70 | HR 70 | Ht 64.0 in | Wt 115.2 lb

## 2013-09-18 DIAGNOSIS — R928 Other abnormal and inconclusive findings on diagnostic imaging of breast: Secondary | ICD-10-CM

## 2013-09-18 DIAGNOSIS — N951 Menopausal and female climacteric states: Secondary | ICD-10-CM

## 2013-09-18 DIAGNOSIS — R921 Mammographic calcification found on diagnostic imaging of breast: Secondary | ICD-10-CM

## 2013-09-18 MED ORDER — ESTRADIOL-NORETHINDRONE ACET 0.05-0.14 MG/DAY TD PTTW: 1.0000 | MEDICATED_PATCH | TRANSDERMAL | Status: DC

## 2013-09-18 NOTE — Progress Notes (Signed)
Patient is scheduled for 6 month follow up, diagnostic bilateral mammogram at [facility]  for 10/17/13 at 3:45. Patient agreeable to time/date/location.

## 2013-09-18 NOTE — Patient Instructions (Signed)
Estradiol; Norethindrone skin patches What is this medicine? ESTRADIOL; NORETHINDRONE (es tra DYE ole; nor eth IN drone) contains a mixture of female hormones. This medicine helps to relieve the symptoms of menopause like hot flashes, night sweats, mood changes, and vaginal dryness and irritation. It is also used to treat women with low estrogen levels or those who have had their ovaries removed. This medicine may be used for other purposes; ask your health care provider or pharmacist if you have questions. COMMON BRAND NAME(S): CombiPatch What should I tell my health care provider before I take this medicine? They need to know if you have any of these conditions: -blood vessel disease or blood clots -breast, cervical, endometrial, or uterine cancer -diabetes -endometriosis -fibroids -gallbladder disease -heart disease or recent heart attack -high blood cholesterol -high blood pressure -high level of calcium in the blood -hysterectomy -kidney disease -liver disease -mental depression -migraine headaches -porphyria -stroke -systemic lupus erythematosus (SLE) -tobacco smoker -vaginal bleeding -an unusual or allergic reaction to estrogens, progestins, other medicines, foods, dyes, or preservatives -pregnant or trying to get pregnant -breast-feeding How should I use this medicine? This medicine is for external use only. Follow the directions on the prescription label. Use exactly as directed. Tear open the pouch, do not use scissors. Remove the stiff protective liner covering the adhesive. Try not to touch the adhesive. Apply the patch, sticky side to the skin, to an area of the lower abdomen that is clean, dry and hairless. Avoid injured, irritated, calloused, or scarred areas. Do not apply the skin patches to your breasts or around the waist area. Use a different site each time to prevent skin irritation. You should change your patch on the same days each week. Do not cut or trim the  patch. Do not stop using except on the advice of your doctor or health care professional. Talk to your pediatrician regarding the use of this medicine in children. Special care may be needed. A patient package insert for the product will be given with each prescription and refill. Read this sheet carefully each time. The sheet may change frequently. Overdosage: If you think you have taken too much of this medicine contact a poison control center or emergency room at once. NOTE: This medicine is only for you. Do not share this medicine with others. What if I miss a dose? If you forget to change your patch as scheduled, apply it as soon as possible. Remember to remove the old patch. If it is almost time to apply the next patch, skip the missed patch and get back on your normal schedule. Do not wear more than one patch at a time unless you are told to do so by your doctor or health care professional. What may interact with this medicine? Do not take this medicine with any of the following medications: -aromatase inhibitors like aminoglutethimide, anastrozole, exemestane, letrozole, testolactone This medicine may also interact with the following medications: -barbiturates, such as phenobarbital -benzodiazepines -bosentan -bromocriptine -carbamazepine -cimetidine -cyclosporine -dantrolene -grapefruit juice -griseofulvin -hydrocortisone, cortisone, or prednisolone -isoniazid (INH) -medications for diabetes -methotrexate -mineral oil -phenytoin -raloxifene -rifabutin, rifampin, or rifapentine -tamoxifen -thyroid hormones -topiramate -tricyclic antidepressants -warfarin This list may not describe all possible interactions. Give your health care provider a list of all the medicines, herbs, non-prescription drugs, or dietary supplements you use. Also tell them if you smoke, drink alcohol, or use illegal drugs. Some items may interact with your medicine. What should I watch for while using  this medicine? Visit  your health care professional for regular checks on your progress. You should have a complete check-up every 6 months. You will need a regular breast and pelvic exam. You should also discuss the need for regular mammograms with your health care professional, and follow his or her guidelines. This medicine can make your body retain fluid, making your fingers, hands, or ankles swell. Your blood pressure can go up. Contact your doctor or health care professional if you feel you are retaining fluid. If you have any reason to think you are pregnant; stop taking this medicine at once and contact your doctor or health care professional. Tobacco smoking increases the risk of getting a blood clot or having a stroke, especially if you are more than 51 years old. You are strongly advised not to smoke. If you wear contact lenses and notice visual changes, or if the lenses begin to feel uncomfortable, consult your eye care specialist. If you are going to have elective surgery, you may need to stop taking this medicine beforehand. Consult your health care professional for advice prior to scheduling the surgery. If you are going to have a MRI procedure, let your MRI technician know about the use of these patches. Some drug patches contain an aluminized backing that can become heated when exposed to MRI and may cause burns. You may need to temporarily remove the patch during the MRI procedure. You may bathe or participate in other activities while wearing your patch. If the patch pulls loose or falls off, you may reapply it if the patch is sticky enough to stay on the skin. You should reapply the patch in a different area. Otherwise use a fresh patch. What side effects may I notice from receiving this medicine? Side effects that you should report to your doctor or health care professional as soon as possible: -allergic reactions like skin rash, itching or hives, swelling of the face, lips, or  tongue -breast tissue changes or discharge -changes in vision -chest pain -confusion, trouble speaking or understanding -dark urine -general ill feeling or flu-like symptoms -light-colored stools -nausea, vomiting -pain, swelling, warmth in the leg -right upper belly pain -severe headaches -shortness of breath -sudden numbness or weakness of the face, arm or leg -trouble walking, dizziness, loss of balance or coordination -unusual vaginal bleeding -yellowing of the eyes or skin Side effects that usually do not require medical attention (report to your doctor or health care professional if they continue or are bothersome): -acne -brown spots on the face -change in appetite -change in sexual desire -depressed mood or mood swings -fluid retention and swelling -stomach cramps or bloating -unusually weak or tired -weight gain This list may not describe all possible side effects. Call your doctor for medical advice about side effects. You may report side effects to FDA at 1-800-FDA-1088. Where should I keep my medicine? Keep out of the reach of children. Store at room temperature between 15 and 30 degrees C (59 and 86 degrees F) in the sealed foil pouch. Throw away any unused medicine after 6 months or the expiration date on the package, whichever is sooner. NOTE: This sheet is a summary. It may not cover all possible information. If you have questions about this medicine, talk to your doctor, pharmacist, or health care provider.  2015, Elsevier/Gold Standard. (2007-12-12 14:04:37)

## 2013-09-18 NOTE — Progress Notes (Signed)
Patient ID: Ashley Mosley, female   DOB: 1960/03/18, 53 y.o.   MRN: [id number] GYNECOLOGY  VISIT   HPI: 53 y.o.   Married  Caucasian  female   [phone] with Patient's last menstrual period was 03/09/2013.   here for  Evaluation of hot flashes. Night sweats wake patient up.  Took Provera at the end of July and had no withdrawal bleeding.   Last pap:10-23-12:wnl:neg HR HPV Last mammogram: 04-17-13 [facility]:Stable benign right bilateral calcifications/dense breasts.  Recommend ?6 month follow-up: [facility]  Patient states she is seeing Dr. Marin Olp 09-26-13 for bone marrow aspiration due to elevated WBC's.  Hospitalized for nervous breakdown in July.   GYNECOLOGIC HISTORY: Patient's last menstrual period was 03/09/2013. Contraception:  Essure   Menopausal hormone therapy: none        OB History   Grav Para Term Preterm Abortions TAB SAB Ect Mult Living   5 3 2 1 2 2    3          Patient Active Problem List   Diagnosis Date Noted  . Elevated hemoglobin A1c 12/11/2012  . Thrombocytosis 12/11/2012  . Uterine fibroid 11/07/2012  . Endometrial mass 11/07/2012  . Anxiety state, unspecified 07/22/2012  . Paranoid schizophrenia 07/22/2012  . Major depressive disorder, single episode, severe, specified as with psychotic behavior 07/22/2012    Past Medical History  Diagnosis Date  . Abnormal uterine bleeding   . Anemia   . Anxiety   . Blood transfusion without reported diagnosis     age 53  . Depression   . Dysmenorrhea   . Endometriosis   . Fibroid 2012  . Clay spotted fever 1970    hospitalized x 1 month, had a blood transfusion  . Abnormal Pap smear 2007 2012  . Gastric ulcer 2014  . Thrombocytosis 2014  . Elevated hemoglobin A1c 2014  . Nervous breakdown 07/2013    [facility] in [place]    Past Surgical History  Procedure Laterality Date  . Cesarean section  1993  . Pelvic laparoscopy    . Colposcopy  2010  . Essure tubal ligation  2010  .  Hysteroscopy w/d&c N/A 12/17/2012    Procedure: DILATATION AND CURETTAGE /HYSTEROSCOPY AND RESECTOSCOPE;  Surgeon: Jamey Reas de Berton Lan, MD;  Location: [facility];  Service: Gynecology;  Laterality: N/A;  . Tubal ligation      Essure    Current Outpatient Prescriptions  Medication Sig Dispense Refill  . ARIPiprazole (ABILIFY) 5 MG tablet Take 5 mg by mouth daily.       No current facility-administered medications for this visit.     ALLERGIES: Codeine  Family History  Problem Relation Age of Onset  . Diabetes Father     History   Social History  . Marital Status: Married    Spouse Name: N/A    Number of Children: N/A  . Years of Education: N/A   Occupational History  . Not on file.   Social History Main Topics  . Smoking status: Current Every Day Smoker -- 1.00 packs/day for 35 years    Types: Cigarettes    Start date: 12/09/1978  . Smokeless tobacco: Never Used     Comment: 05-08-13  still smoking  . Alcohol Use: 2.5 oz/week    5 drink(s) per week     Comment: 5drinks a week (vodka)  . Drug Use: No  . Sexual Activity: Yes    Partners: Male    Birth  Control/ Protection: Other-see comments     Comment: Essure   Other Topics Concern  . Not on file   Social History Narrative  . No narrative on file    ROS:  Pertinent items are noted in HPI.  PHYSICAL EXAMINATION:    BP 122/70  Pulse 70  Ht 5\' 4"  (1.626 m)  Wt 115 lb 3.2 oz (52.254 kg)  BMI 19.76 kg/m2  LMP 03/09/2013     General appearance: alert, cooperative and appears stated age.  Appears calm today.   ASSESSMENT  Menopausal symptoms.  Need for follow up mammogram for right breast calcifications.   PLAN  Schedule diagnostic bilateral mammogram.  Check FSH, estradiol, TFTs.  Rx for Combipatch one month only.  Discussed benefits and risks including breast cancer, DVT, PE, MI, stroke.  Understand she may have breast tenderness and possible vaginal spotting.  I anticipate refills after  follow up mammogram is done.  Return in 3 months for a recheck.    An After Visit Summary was printed and given to the patient.  __25____ minutes face to face time of which over 50% was spent in counseling.

## 2013-09-19 LAB — FOLLICLE STIMULATING HORMONE: FSH: 58.3 m[IU]/mL

## 2013-09-19 LAB — ESTRADIOL: ESTRADIOL: 48.9 pg/mL

## 2013-09-19 LAB — THYROID PANEL WITH TSH
Free Thyroxine Index: 1.9 (ref 1.4–3.8)
T3 Uptake: 27 % (ref 22.0–35.0)
T4, Total: 7.2 ug/dL (ref 4.5–12.0)
TSH: 0.58 u[IU]/mL (ref 0.350–4.500)

## 2013-09-24 ENCOUNTER — Encounter: Payer: Self-pay | Admitting: Hematology & Oncology

## 2013-09-24 ENCOUNTER — Ambulatory Visit (HOSPITAL_BASED_OUTPATIENT_CLINIC_OR_DEPARTMENT_OTHER): Payer: 59 | Admitting: Hematology & Oncology

## 2013-09-24 ENCOUNTER — Other Ambulatory Visit (HOSPITAL_BASED_OUTPATIENT_CLINIC_OR_DEPARTMENT_OTHER): Payer: 59 | Admitting: Lab

## 2013-09-24 VITALS — BP 140/80 | HR 81 | Temp 98.0°F | Resp 14 | Ht 64.0 in | Wt 116.0 lb

## 2013-09-24 DIAGNOSIS — D509 Iron deficiency anemia, unspecified: Secondary | ICD-10-CM

## 2013-09-24 DIAGNOSIS — D75839 Thrombocytosis, unspecified: Secondary | ICD-10-CM

## 2013-09-24 DIAGNOSIS — D510 Vitamin B12 deficiency anemia due to intrinsic factor deficiency: Secondary | ICD-10-CM

## 2013-09-24 DIAGNOSIS — D473 Essential (hemorrhagic) thrombocythemia: Secondary | ICD-10-CM

## 2013-09-24 DIAGNOSIS — K909 Intestinal malabsorption, unspecified: Secondary | ICD-10-CM

## 2013-09-24 LAB — CBC WITH DIFFERENTIAL (CANCER CENTER ONLY)
BASO#: 0 10*3/uL (ref 0.0–0.2)
BASO%: 0.3 % (ref 0.0–2.0)
EOS ABS: 0.2 10*3/uL (ref 0.0–0.5)
EOS%: 1.5 % (ref 0.0–7.0)
HCT: 41 % (ref 34.8–46.6)
HGB: 14.1 g/dL (ref 11.6–15.9)
LYMPH#: 2.5 10*3/uL (ref 0.9–3.3)
LYMPH%: 21.5 % (ref 14.0–48.0)
MCH: 33.2 pg (ref 26.0–34.0)
MCHC: 34.4 g/dL (ref 32.0–36.0)
MCV: 97 fL (ref 81–101)
MONO#: 0.8 10*3/uL (ref 0.1–0.9)
MONO%: 7 % (ref 0.0–13.0)
NEUT%: 69.7 % (ref 39.6–80.0)
NEUTROS ABS: 8.1 10*3/uL — AB (ref 1.5–6.5)
PLATELETS: 369 10*3/uL (ref 145–400)
RBC: 4.25 10*6/uL (ref 3.70–5.32)
RDW: 12.7 % (ref 11.1–15.7)
WBC: 11.6 10*3/uL — AB (ref 3.9–10.0)

## 2013-09-24 LAB — CHCC SATELLITE - SMEAR

## 2013-09-24 MED ORDER — PANCRELIPASE (LIP-PROT-AMYL) 24000-76000 UNITS PO CPEP: 1.0000 | ORAL_CAPSULE | Freq: Three times a day (TID) | ORAL | Status: DC

## 2013-09-24 NOTE — Patient Instructions (Signed)
Smoking Cessation Quitting smoking is important to your health and has many advantages. However, it is not always easy to quit since nicotine is a very addictive drug. Oftentimes, people try 3 times or more before being able to quit. This document explains the best ways for you to prepare to quit smoking. Quitting takes hard work and a lot of effort, but you can do it. ADVANTAGES OF QUITTING SMOKING  You will live longer, feel better, and live better.  Your body will feel the impact of quitting smoking almost immediately.  Within 20 minutes, blood pressure decreases. Your pulse returns to its normal level.  After 8 hours, carbon monoxide levels in the blood return to normal. Your oxygen level increases.  After 24 hours, the chance of having a heart attack starts to decrease. Your breath, hair, and body stop smelling like smoke.  After 48 hours, damaged nerve endings begin to recover. Your sense of taste and smell improve.  After 72 hours, the body is virtually free of nicotine. Your bronchial tubes relax and breathing becomes easier.  After 2 to 12 weeks, lungs can hold more air. Exercise becomes easier and circulation improves.  The risk of having a heart attack, stroke, cancer, or lung disease is greatly reduced.  After 1 year, the risk of coronary heart disease is cut in half.  After 5 years, the risk of stroke falls to the same as a nonsmoker.  After 10 years, the risk of lung cancer is cut in half and the risk of other cancers decreases significantly.  After 15 years, the risk of coronary heart disease drops, usually to the level of a nonsmoker.  If you are pregnant, quitting smoking will improve your chances of having a healthy baby.  The people you live with, especially any children, will be healthier.  You will have extra money to spend on things other than cigarettes. QUESTIONS TO THINK ABOUT BEFORE ATTEMPTING TO QUIT You may want to talk about your answers with your  health care provider.  Why do you want to quit?  If you tried to quit in the past, what helped and what did not?  What will be the most difficult situations for you after you quit? How will you plan to handle them?  Who can help you through the tough times? Your family? Friends? A health care provider?  What pleasures do you get from smoking? What ways can you still get pleasure if you quit? Here are some questions to ask your health care provider:  How can you help me to be successful at quitting?  What medicine do you think would be best for me and how should I take it?  What should I do if I need more help?  What is smoking withdrawal like? How can I get information on withdrawal? GET READY  Set a quit date.  Change your environment by getting rid of all cigarettes, ashtrays, matches, and lighters in your home, car, or work. Do not let people smoke in your home.  Review your past attempts to quit. Think about what worked and what did not. GET SUPPORT AND ENCOURAGEMENT You have a better chance of being successful if you have help. You can get support in many ways.  Tell your family, friends, and coworkers that you are going to quit and need their support. Ask them not to smoke around you.  Get individual, group, or telephone counseling and support. Programs are available at local hospitals and health centers. Call   your local health department for information about programs in your area.  Spiritual beliefs and practices may help some smokers quit.  Download a "quit meter" on your computer to keep track of quit statistics, such as how long you have gone without smoking, cigarettes not smoked, and money saved.  Get a self-help book about quitting smoking and staying off tobacco. LEARN NEW SKILLS AND BEHAVIORS  Distract yourself from urges to smoke. Talk to someone, go for a walk, or occupy your time with a task.  Change your normal routine. Take a different route to work.  Drink tea instead of coffee. Eat breakfast in a different place.  Reduce your stress. Take a hot bath, exercise, or read a book.  Plan something enjoyable to do every day. Reward yourself for not smoking.  Explore interactive web-based programs that specialize in helping you quit. GET MEDICINE AND USE IT CORRECTLY Medicines can help you stop smoking and decrease the urge to smoke. Combining medicine with the above behavioral methods and support can greatly increase your chances of successfully quitting smoking.  Nicotine replacement therapy helps deliver nicotine to your body without the negative effects and risks of smoking. Nicotine replacement therapy includes nicotine gum, lozenges, inhalers, nasal sprays, and skin patches. Some may be available over-the-counter and others require a prescription.  Antidepressant medicine helps people abstain from smoking, but how this works is unknown. This medicine is available by prescription.  Nicotinic receptor partial agonist medicine simulates the effect of nicotine in your brain. This medicine is available by prescription. Ask your health care provider for advice about which medicines to use and how to use them based on your health history. Your health care provider will tell you what side effects to look out for if you choose to be on a medicine or therapy. Carefully read the information on the package. Do not use any other product containing nicotine while using a nicotine replacement product.  RELAPSE OR DIFFICULT SITUATIONS Most relapses occur within the first 3 months after quitting. Do not be discouraged if you start smoking again. Remember, most people try several times before finally quitting. You may have symptoms of withdrawal because your body is used to nicotine. You may crave cigarettes, be irritable, feel very hungry, cough often, get headaches, or have difficulty concentrating. The withdrawal symptoms are only temporary. They are strongest  when you first quit, but they will go away within 10-14 days. To reduce the chances of relapse, try to:  Avoid drinking alcohol. Drinking lowers your chances of successfully quitting.  Reduce the amount of caffeine you consume. Once you quit smoking, the amount of caffeine in your body increases and can give you symptoms, such as a rapid heartbeat, sweating, and anxiety.  Avoid smokers because they can make you want to smoke.  Do not let weight gain distract you. Many smokers will gain weight when they quit, usually less than 10 pounds. Eat a healthy diet and stay active. You can always lose the weight gained after you quit.  Find ways to improve your mood other than smoking. FOR MORE INFORMATION  www.smokefree.gov  Document Released: 12/20/2000 Document Revised: 05/12/2013 Document Reviewed: 04/06/2011 ExitCare Patient Information 2015 [employer]. This information is not intended to replace advice given to you by your health care provider. Make sure you discuss any questions you have with your health care provider.  

## 2013-09-25 NOTE — Progress Notes (Signed)
Hematology and Oncology Follow Up Visit  Ashley Mosley [id number] 08-Jan-1961 53 y.o. 09/25/2013   Principle Diagnosis:   Transient leukocytosis and thrombocytosis  Iron deficiency anemia  Chronic abdominal pain  Current Therapy:    Observation     Interim History:  Ms.  Ashley Mosley is back for followup. Her husband is with her this time period. She had a good summer. She was hospitalized because of some psychiatric issues.  She her husband did go to [place]. There went to the bike ralley at [place]. They had a good time.  She is supposed to get IV iron over the summer. We'll we last saw her in June, her ferritin was 17 and her iron saturation was only 14%. Her total iron was 64.  I'm not sure when her last colonoscopy was done. I will have to ask about this.  She does not have her monthly cycles.  She did not want the IV iron.  Her last vitamin B12 was 228.  She has problems with diarrhea. She has problems with malabsorption. I will go ahead and put her on some Creon. Hopefully this will help her.  She has a lot of issues going on. Mostly, these or not anything related to her blood.  Thought about doing a bone marrow biopsy on her. Again, she did not wish to undergo that test.  She had thyroid studies done just last week. Everything looked okay. She had a high FSH of 58.  She is still smoking.    Medications: Current outpatient prescriptions:ARIPiprazole (ABILIFY) 5 MG tablet, Take 5 mg by mouth daily., Disp: , Rfl: ;  estradiol-norethindrone (COMBIPATCH) 0.05-0.14 MG/DAY, Place 1 patch onto the skin 2 (two) times a week., Disp: 8 patch, Rfl: 0;  Pancrelipase, Lip-Prot-Amyl, 24000 UNITS CPEP, Take 1 capsule (24,000 Units total) by mouth 3 (three) times daily., Disp: 180 capsule, Rfl: 2  Allergies:  Allergies  Allergen Reactions  . Codeine Nausea Only    Past Medical History, Surgical history, Social history, and Family History were reviewed and updated.  Review of  Systems: As above  Physical Exam:  height is $RemoveB'5\' 4"'VLZnzmnX$  (1.626 m) and weight is 116 lb (52.617 kg). Her oral temperature is 98 F (36.7 C). Her blood pressure is 140/80 and her pulse is 81. Her respiration is 14.   Petite white female in no obvious distress. Head and neck exam shows no ocular or oral lesions. She has no adenopathy in the neck. Lungs are clear. She has occasional wheeze. Cardiac exam regular rate and rhythm with no murmurs, rubs or bruits. Abdomen is soft. She has some tenderness up in the left upper quadrant. No obvious masses noted. There is no obvious splenomegaly there is no fluid wave She has good bowel sounds. Back exam shows no tenderness over the spine, ribs or hips. Extremities shows no clubbing, cyanosis or edema. Neurological exam shows no focal neurological deficits. Skin exam shows no rashes, ecchymosis or petechia. Lab Results  Component Value Date   WBC 11.6* 09/24/2013   HGB 14.1 09/24/2013   HCT 41.0 09/24/2013   MCV 97 09/24/2013   PLT 369 09/24/2013     Chemistry      Component Value Date/Time   NA 138 12/17/2012 0910   K 4.6 12/17/2012 0910   CL 103 12/17/2012 0910   CO2 27 12/17/2012 0910   BUN 8 12/17/2012 0910   CREATININE 0.62 12/17/2012 0910   CREATININE 0.64 11/07/2012 1124      Component Value  Date/Time   CALCIUM 9.4 12/17/2012 0910   ALKPHOS 61 11/07/2012 1124   AST 23 11/07/2012 1124   ALT 12 11/07/2012 1124   BILITOT 0.4 11/07/2012 1124         Impression and Plan: Ms. Ashley Mosley is a 53 year old female. She has mild leukocytosis. The normocytosis has resolved. He will be very interested to see what her iron studies show. Hopefully, she had iron studies done. If not, we will need to get her back in for this.  She probably is going to need a colonoscopy. We'll have to call her and disease she has had one.  A list of her blood smear. I do not see anything that looked unusual. I do not see any immature white cells. I do not see any nucleated red cells.  Platelets with adequate.  I still think that the leukocytosis is from her smoking. However, with the iron deficiency, I think that she would does did have some GI evaluation. I am sure that she has had this before.  I will plan to get her back to see [place] in another couple months.  Hopefully the Creon we'll to help with her diarrhea.  Volanda Napoleon, MD 9/17/20157:14 AM

## 2013-09-26 ENCOUNTER — Ambulatory Visit: Payer: BC Managed Care – PPO | Admitting: Obstetrics and Gynecology

## 2013-10-17 ENCOUNTER — Ambulatory Visit
Admission: RE | Admit: 2013-10-17 | Discharge: 2013-10-17 | Disposition: A | Discharge status: Home / Self Care | Payer: 59 | Source: Ambulatory Visit | Attending: Obstetrics and Gynecology | Admitting: Obstetrics and Gynecology

## 2013-10-17 DIAGNOSIS — R921 Mammographic calcification found on diagnostic imaging of breast: Secondary | ICD-10-CM

## 2013-10-31 ENCOUNTER — Ambulatory Visit: Payer: BC Managed Care – PPO | Admitting: Obstetrics and Gynecology

## 2013-11-10 ENCOUNTER — Encounter: Payer: Self-pay | Admitting: Hematology & Oncology

## 2013-11-21 ENCOUNTER — Ambulatory Visit (INDEPENDENT_AMBULATORY_CARE_PROVIDER_SITE_OTHER): Payer: 59 | Admitting: Obstetrics and Gynecology

## 2013-11-21 ENCOUNTER — Encounter: Payer: Self-pay | Admitting: Obstetrics and Gynecology

## 2013-11-21 VITALS — BP 118/66 | HR 88 | Resp 16 | Ht 64.25 in | Wt 121.0 lb

## 2013-11-21 DIAGNOSIS — Z0000 Encounter for general adult medical examination without abnormal findings: Secondary | ICD-10-CM

## 2013-11-21 DIAGNOSIS — Z01419 Encounter for gynecological examination (general) (routine) without abnormal findings: Secondary | ICD-10-CM

## 2013-11-21 LAB — POCT URINALYSIS DIPSTICK
Bilirubin, UA: NEGATIVE
Glucose, UA: NEGATIVE
Ketones, UA: NEGATIVE
Leukocytes, UA: NEGATIVE
Nitrite, UA: NEGATIVE
Protein, UA: NEGATIVE
RBC UA: NEGATIVE
UROBILINOGEN UA: NEGATIVE
pH, UA: 5

## 2013-11-21 NOTE — Patient Instructions (Signed)

## 2013-11-21 NOTE — Progress Notes (Signed)
53 y.o. [id number] MarriedCaucasianF here for annual exam.    Did not start Combipatch.  Cost issues and doing OK off HRT. Now no hot flashes and no bleeding.   Dr. Marin Olp following patient. Declined bone marrow biopsy.  Declined iron infusion.  Platelets now back to normal. WBC is still slightly elevated.   Taking Abilify.   Decreased libido.  Felt great when went on vacation.   Modifying caffeine use to reduce urination.   Patient's last menstrual period was 03/09/2013.          Sexually active: Yes.    Last abnormal pap in 2009. The current method of family planning is tubal ligation.   Essure.  Exercising: No.  The patient does not participate in regular exercise at present. Smoker:  yes  Health Maintenance: Pap:  10/2012 Neg. HR HPV: Neg History of abnormal Pap:  Yes.   MMG:  10/2013 BIRADS2:Benign Colonoscopy: 11/2012 Polyps  BMD:  N/A TDaP: 2007 - per pt Screening Labs: PCP, Hb today: PCP, Urine today: Clear   reports that she has been smoking Cigarettes.  She started smoking about 34 years ago. She has a 17.5 pack-year smoking history. She has never used smokeless tobacco. She reports that she drinks about 2.5 oz of alcohol per week. She reports that she does not use illicit drugs.  Past Medical History  Diagnosis Date  . Abnormal uterine bleeding   . Anemia   . Anxiety   . Blood transfusion without reported diagnosis     age 70  . Depression   . Dysmenorrhea   . Endometriosis   . Fibroid 2012  . [place] spotted fever 1970    hospitalized x 1 month, had a blood transfusion  . Abnormal Pap smear 2007 2012  . Gastric ulcer 2014  . Thrombocytosis 2014  . Elevated hemoglobin A1c 2014  . Nervous breakdown 07/2013    [facility] in [place]    Past Surgical History  Procedure Laterality Date  . Cesarean section  1993  . Pelvic laparoscopy    . Colposcopy  2010  . Essure tubal ligation  2010  . Hysteroscopy w/d&c N/A 12/17/2012    Procedure:  DILATATION AND CURETTAGE /HYSTEROSCOPY AND RESECTOSCOPE;  Surgeon: Jamey Reas de Berton Lan, MD;  Location: [facility];  Service: Gynecology;  Laterality: N/A;  . Tubal ligation      Essure    Current Outpatient Prescriptions  Medication Sig Dispense Refill  . ARIPiprazole (ABILIFY) 5 MG tablet Take 5 mg by mouth daily.    . TRANSDERM-SCOP 1 MG/3DAYS Place 1 patch onto the skin every 3 (three) days.   0   No current facility-administered medications for this visit.    Family History  Problem Relation Age of Onset  . Diabetes Father     ROS:  Pertinent items are noted in HPI.  Otherwise, a comprehensive ROS was negative.  Exam:   BP 118/66 mmHg  Pulse 88  Resp 16  Ht 5' 4.25" (1.632 m)  Wt 121 lb (54.885 kg)  BMI 20.61 kg/m2  LMP 03/09/2013     Height: 5' 4.25" (163.2 cm)  Ht Readings from Last 3 Encounters:  11/21/13 5' 4.25" (1.632 m)  09/24/13 '5\' 4"'  (1.626 m)  09/18/13 '5\' 4"'  (1.626 m)    General appearance: alert, cooperative and appears stated age Head: Normocephalic, without obvious abnormality, atraumatic Neck: no adenopathy, supple, symmetrical, trachea midline and thyroid normal to inspection and palpation Lungs: clear to  auscultation bilaterally Breasts: normal appearance, no masses or tenderness, Inspection negative, No nipple retraction or dimpling, No nipple discharge or bleeding, No axillary or supraclavicular adenopathy Heart: regular rate and rhythm Abdomen: soft, non-tender; bowel sounds normal; no masses,  no organomegaly Extremities: extremities normal, atraumatic, no cyanosis or edema Skin: Skin color, texture, turgor normal. No rashes or lesions Lymph nodes: Cervical, supraclavicular, and axillary nodes normal. No abnormal inguinal nodes palpated Neurologic: Grossly normal   Pelvic: External genitalia:  no lesions              Urethra:  normal appearing urethra with no masses, tenderness or lesions              Bartholins and Skenes: normal                  Vagina: normal appearing vagina with normal color and discharge, no lesions              Cervix:  Patchiness - white on cervix.              Pap taken: Yes.   Bimanual Exam:  Uterus:  normal  Uterus nonenlarged and nontender.               Adnexa: normal adnexa and no mass, fullness, tenderness               Rectovaginal: Confirms               Anus:  normal sphincter tone, no lesions  A:  Well Woman with normal exam Patchy flat whitish color change of the cervix.  Decreased libido.   P:   Mammogram yearly.  pap smear and HR HPV taken.  Discussed increased time with partner and reduction of stress.  return annually or prn  An After Visit Summary was printed and given to the patient.

## 2013-11-24 ENCOUNTER — Ambulatory Visit (HOSPITAL_BASED_OUTPATIENT_CLINIC_OR_DEPARTMENT_OTHER): Payer: 59 | Admitting: Hematology & Oncology

## 2013-11-24 ENCOUNTER — Other Ambulatory Visit (HOSPITAL_BASED_OUTPATIENT_CLINIC_OR_DEPARTMENT_OTHER): Payer: 59 | Admitting: Lab

## 2013-11-24 ENCOUNTER — Encounter: Payer: Self-pay | Admitting: Hematology & Oncology

## 2013-11-24 VITALS — BP 117/71 | HR 87 | Temp 98.4°F | Resp 14 | Ht 64.0 in | Wt 117.0 lb

## 2013-11-24 DIAGNOSIS — D72829 Elevated white blood cell count, unspecified: Secondary | ICD-10-CM

## 2013-11-24 DIAGNOSIS — D473 Essential (hemorrhagic) thrombocythemia: Secondary | ICD-10-CM

## 2013-11-24 DIAGNOSIS — K909 Intestinal malabsorption, unspecified: Secondary | ICD-10-CM

## 2013-11-24 DIAGNOSIS — D75839 Thrombocytosis, unspecified: Secondary | ICD-10-CM

## 2013-11-24 DIAGNOSIS — Z720 Tobacco use: Secondary | ICD-10-CM

## 2013-11-24 DIAGNOSIS — D509 Iron deficiency anemia, unspecified: Secondary | ICD-10-CM

## 2013-11-24 DIAGNOSIS — D510 Vitamin B12 deficiency anemia due to intrinsic factor deficiency: Secondary | ICD-10-CM

## 2013-11-24 DIAGNOSIS — R739 Hyperglycemia, unspecified: Secondary | ICD-10-CM

## 2013-11-24 LAB — CMP (CANCER CENTER ONLY)
ALBUMIN: 3.7 g/dL (ref 3.3–5.5)
ALK PHOS: 54 U/L (ref 26–84)
ALT: 17 U/L (ref 10–47)
AST: 26 U/L (ref 11–38)
BUN, Bld: 10 mg/dL (ref 7–22)
CO2: 26 mEq/L (ref 18–33)
Calcium: 9 mg/dL (ref 8.0–10.3)
Chloride: 97 mEq/L — ABNORMAL LOW (ref 98–108)
Creat: 0.7 mg/dl (ref 0.6–1.2)
Glucose, Bld: 174 mg/dL — ABNORMAL HIGH (ref 73–118)
POTASSIUM: 3.9 meq/L (ref 3.3–4.7)
SODIUM: 141 meq/L (ref 128–145)
TOTAL PROTEIN: 7.6 g/dL (ref 6.4–8.1)
Total Bilirubin: 0.5 mg/dl (ref 0.20–1.60)

## 2013-11-24 LAB — VITAMIN B12: VITAMIN B 12: 312 pg/mL (ref 211–911)

## 2013-11-24 LAB — CBC WITH DIFFERENTIAL (CANCER CENTER ONLY)
BASO#: 0 10*3/uL (ref 0.0–0.2)
BASO%: 0.4 % (ref 0.0–2.0)
EOS%: 1.9 % (ref 0.0–7.0)
Eosinophils Absolute: 0.2 10*3/uL (ref 0.0–0.5)
HEMATOCRIT: 43.1 % (ref 34.8–46.6)
HGB: 14.6 g/dL (ref 11.6–15.9)
LYMPH#: 2.3 10*3/uL (ref 0.9–3.3)
LYMPH%: 22.8 % (ref 14.0–48.0)
MCH: 32.8 pg (ref 26.0–34.0)
MCHC: 33.9 g/dL (ref 32.0–36.0)
MCV: 97 fL (ref 81–101)
MONO#: 0.6 10*3/uL (ref 0.1–0.9)
MONO%: 5.8 % (ref 0.0–13.0)
NEUT#: 7 10*3/uL — ABNORMAL HIGH (ref 1.5–6.5)
NEUT%: 69.1 % (ref 39.6–80.0)
Platelets: 358 10*3/uL (ref 145–400)
RBC: 4.45 10*6/uL (ref 3.70–5.32)
RDW: 12.1 % (ref 11.1–15.7)
WBC: 10.1 10*3/uL — ABNORMAL HIGH (ref 3.9–10.0)

## 2013-11-24 NOTE — Progress Notes (Signed)
Hematology and Oncology Follow Up Visit  Ashley Mosley [id number] 1960/12/20 53 y.o. 11/24/2013   Principle Diagnosis:   Transient leukocytosis and thrombocytosis  Iron deficiency anemia  Chronic abdominal pain  Current Therapy:    Observation     Interim History:  Ashley Mosley is back for followup. She still has some diarrhea. Is not as bad. When she was last here, I did give her some Creon. She did not get this filled.  She is seen her gynecologist. Her gynecologist is watching her for some polyps and an area on her cervix. Some scrapings were done recently. Ashley Mosley has not gotten any results back. She also has a fibroid that is being watched right now.   Her abdominal pain is not as bad. She's had no nausea or vomiting.  She's had no obvious bleeding.  She's had no fever. She still smokes about 1 pack a day of cigarettes. She's had no cough or shortness of breath. I'm not sure when her last colonoscopy was done. I will have to ask about this.   Medications: Current outpatient prescriptions: ARIPiprazole (ABILIFY) 5 MG tablet, Take 5 mg by mouth daily., Disp: , Rfl: ;  Multiple Vitamins-Minerals (MULTIVITAL) CHEW, Chew by mouth every morning., Disp: , Rfl:   Allergies:  Allergies  Allergen Reactions  . Codeine Nausea Only    Past Medical History, Surgical history, Social history, and Family History were reviewed and updated.  Review of Systems: As above  Physical Exam:  height is 5\' 4"  (1.626 m) and weight is 117 lb (53.071 kg). Her oral temperature is 98.4 F (36.9 C). Her blood pressure is 117/71 and her pulse is 87. Her respiration is 14.   Petite white female in no obvious distress. Head and neck exam shows no ocular or oral lesions. She has no adenopathy in the neck. Lungs are clear. She has occasional wheeze. Cardiac exam regular rate and rhythm with no murmurs, rubs or bruits. Abdomen is soft. She has some slight tenderness up in the left upper quadrant. No  obvious masses noted. There is no obvious splenomegaly there is no fluid wave She has good bowel sounds. Back exam shows no tenderness over the spine, ribs or hips. Extremities shows no clubbing, cyanosis or edema. Neurological exam shows no focal neurological deficits. Skin exam shows no rashes, ecchymosis or petechia. Lab Results  Component Value Date   WBC 10.1* 11/24/2013   HGB 14.6 11/24/2013   HCT 43.1 11/24/2013   MCV 97 11/24/2013   PLT 358 11/24/2013     Chemistry      Component Value Date/Time   NA 141 11/24/2013 1323   NA 138 12/17/2012 0910   K 3.9 11/24/2013 1323   K 4.6 12/17/2012 0910   CL 97* 11/24/2013 1323   CL 103 12/17/2012 0910   CO2 26 11/24/2013 1323   CO2 27 12/17/2012 0910   BUN 10 11/24/2013 1323   BUN 8 12/17/2012 0910   CREATININE 0.7 11/24/2013 1323   CREATININE 0.62 12/17/2012 0910      Component Value Date/Time   CALCIUM 9.0 11/24/2013 1323   CALCIUM 9.4 12/17/2012 0910   ALKPHOS 54 11/24/2013 1323   ALKPHOS 61 11/07/2012 1124   AST 26 11/24/2013 1323   AST 23 11/07/2012 1124   ALT 17 11/24/2013 1323   ALT 12 11/07/2012 1124   BILITOT 0.50 11/24/2013 1323   BILITOT 0.4 11/07/2012 1124     I looked at her blood smear. I do  not see any immature myeloid cells. She had no atypical lymphocytes. Her red cells appear normal in morphology. Platelets appeared well granulated.   Impression and Plan: Ashley Mosley is a 53 year old female. Her blood count continues to improve. I really cannot find anything hematologically wrong. I still think that some of the leukocytosis is from her smoking. She try to cut back on smoking so possibly the leukocytosis will improve.  I really don't think that we have to get her back to the office now. I just don't see any obvious hematologic issue going on.  Her blood sugar was elevated. This might be some that needs to be looked at.  I told her that we certainly would be able to see her her at any time if she has  problems in the future.Marland Kitchen  Volanda Napoleon, MD 11/16/20152:19 PM

## 2013-11-25 LAB — IRON AND TIBC CHCC
%SAT: 28 % (ref 21–57)
Iron: 103 ug/dL (ref 41–142)
TIBC: 371 ug/dL (ref 236–444)
UIBC: 268 ug/dL (ref 120–384)

## 2013-11-25 LAB — IPS PAP TEST WITH HPV

## 2013-11-25 LAB — FERRITIN CHCC: Ferritin: 57 ng/ml (ref 9–269)

## 2013-11-26 ENCOUNTER — Encounter: Payer: Self-pay | Admitting: *Deleted

## 2013-12-16 ENCOUNTER — Telehealth: Payer: Self-pay | Admitting: Obstetrics and Gynecology

## 2013-12-16 NOTE — Telephone Encounter (Signed)
Pt called to cancel her appointment on 12/18/13 with Dr Quincy Simmonds. Pt asked why she was scheduled for the appointment - i relayed the notes of "med reck". Pt stated that she isn't on any meds rx'd by Dr Quincy Simmonds and doesn't need the appointment. She then asked me to relay the message that she has been bleeding for a week now. I asked if she was supposed to be bleeding right now and she said no. She stated she had surgery last December and has experienced periodic bleeding since then. Currently its been going on for a week. Looking in pts appointment history she had surgery for diagnosis codes of Monorrhagia, metorrhagia, and endometrial masses.   I asked the patient if she was sure she wanted to cancel with Dr Quincy Simmonds or did she want to see her for the bleeding instead using the same appointment. She declined stating if she was still bleeding and needed to see her she would call. I urged her again to use the appointment if she needed to as rescheduling can take some time with Dr Elza Rafter schedule. She again declined and said she'd call if she needs [place].   Cancelling appiontment per pts wishes. Please call pt to discuss.  bf

## 2013-12-16 NOTE — Telephone Encounter (Signed)
Routing to Dr.Silva as FYI. Anything further needed for this patient? Patient declining appointment at this time.

## 2013-12-17 NOTE — Telephone Encounter (Signed)
Patient is OK to not keep her appointment.  Originally this was scheduled for a medication recheck, but she did not start on HRT.  Patient is perimenopausal and this represents a menstrual cycle for her.  She needs to keep track of the cycles and call if she has prolonged bleeding or bleeding that is less than 1 month apart.  Her prior menses was March 2015.  She starts her new clock over again for going 12 months without a cycle for [place] to tell her she is completely menopausal.

## 2013-12-17 NOTE — Telephone Encounter (Signed)
Left message to call Samrat Hayward at [phone]. 

## 2013-12-18 ENCOUNTER — Ambulatory Visit: Payer: 59 | Admitting: Obstetrics and Gynecology

## 2013-12-18 NOTE — Telephone Encounter (Signed)
Spoke with patient. Advised patient of message as seen below from Dr.Silva. Patient is agreeable and verbalizes understanding.  Routing to provider for final review. Patient agreeable to disposition. Will close encounter   

## 2014-01-06 ENCOUNTER — Telehealth: Payer: Self-pay | Admitting: Obstetrics and Gynecology

## 2014-01-06 MED ORDER — MEDROXYPROGESTERONE ACETATE 10 MG PO TABS: 10.0000 mg | ORAL_TABLET | Freq: Every day | ORAL | Status: DC

## 2014-01-06 NOTE — Telephone Encounter (Signed)
Spoke with patient. Patient states that in December of last year she had a D&C. States only had light bleeding after procedure. Patient took Provera in March of 2015 with withdrawal bleed and again in July 2015 with no withdrawal bleed. Patient had ultrasound on 3/31 which showed "fibroid 3.6 cm, EMS thin, left ovarian follicle 2.1 x 1.9 cm. Essure coils noted." Was seen for annual on 11/21/13 with Dr.Silva without any bleeding. States LMP was in March of 2015. Patient starting bleeding 12/8-12/16 then began again on 12/26. Patient is currently changing pad every 2-3 hours. Has abdominal cramping and light back pain. States she feels weak and fatigued. Denies shortness of breath. Not currently on any HRT or birth control pills. Patient has Essure placed. "I don't know if this is from the fibroid I have or not. She mentioned seeing something on my cervix when I was there for my annual. I don't know if I need more Progesterone or what." Advised patient will speak with Dr.Miller as Dr.Silva is out of the office today and return call with further recommendations and instructions. Patient is agreeable.

## 2014-01-06 NOTE — Telephone Encounter (Addendum)
Patient started bleeding again and it is extremely heavy.

## 2014-01-06 NOTE — Telephone Encounter (Signed)
I will be happy to see the patient tomorrow at 4:15 pm.

## 2014-01-06 NOTE — Telephone Encounter (Signed)
Provera 10mg  daily.  Send in RX for #30 so Dr. Quincy Simmonds can increase as needed.  appt tomorrow please with Dr. Quincy Simmonds.  CC:  Dr. Quincy Simmonds

## 2014-01-06 NOTE — Telephone Encounter (Signed)
Spoke with patient. Advised patient of message as seen below from [place]. Patient is agreeable and verbalizes understanding. Offered patient appointment tomorrow at [facility] with Dr.Silva. Patient declines. States she is only available tomorrow after 2pm or Thursday afternoon. Only appointment available tomorrow is 4:15pm slot with Dr.Silva. Advised patient will need to check with Dr.Silva first thing in the morning regarding scheduling and return call with appointment date and time options. Patient is agreeable. Rx for Provera 10mg  #30 0RF sent to pharmacy on file.

## 2014-01-07 NOTE — Telephone Encounter (Signed)
Attempted to reach patient at number provided [phone]. Phone line is currently busy.

## 2014-01-07 NOTE — Telephone Encounter (Signed)
Left message to call [place] at [phone].   Spoke with Dr.Silva personally. Patient needs to be scheduled for appointment next week.

## 2014-01-14 NOTE — Telephone Encounter (Signed)
Left message to call Jamesyn Moorefield at [phone]. 

## 2014-01-19 NOTE — Telephone Encounter (Signed)
Spoke with patient. Patient states that bleeding has stopped. Is no longer taking the Provera. "Now I am having some discharge after interaction with my husband. It also feels like there is something on the left side inside me." Advised of importance of follow up with Dr.Silva. Patient is agreeable to schedule. Requesting appointment for next week due to schedule. Appointment scheduled for 1/18 at 11am with Dr.Silva (date and time per Gay Filler). Patient is agreeable to date and time. If bleeding begins again or symptoms increase will need to call to be seen sooner. Patient agreeable.  Routing to provider for final review. Patient agreeable to disposition. Will close encounter

## 2014-01-26 ENCOUNTER — Ambulatory Visit: Payer: Self-pay | Admitting: Obstetrics and Gynecology

## 2014-01-28 ENCOUNTER — Telehealth: Payer: Self-pay | Admitting: Obstetrics and Gynecology

## 2014-01-28 ENCOUNTER — Ambulatory Visit: Payer: Self-pay | Admitting: Obstetrics and Gynecology

## 2014-01-28 NOTE — Telephone Encounter (Signed)
Thank you for the update!

## 2014-01-28 NOTE — Telephone Encounter (Signed)
Patient dnka recheck appointment today for " follow up DUB" Left message for patient to call and reschedule. Dnka fee applied.

## 2014-02-26 ENCOUNTER — Telehealth: Payer: Self-pay | Admitting: *Deleted

## 2014-02-26 ENCOUNTER — Other Ambulatory Visit: Payer: Self-pay | Admitting: *Deleted

## 2014-02-26 ENCOUNTER — Telehealth: Payer: Self-pay | Admitting: Hematology & Oncology

## 2014-02-26 DIAGNOSIS — N9489 Other specified conditions associated with female genital organs and menstrual cycle: Secondary | ICD-10-CM

## 2014-02-26 DIAGNOSIS — D473 Essential (hemorrhagic) thrombocythemia: Secondary | ICD-10-CM

## 2014-02-26 DIAGNOSIS — D75839 Thrombocytosis, unspecified: Secondary | ICD-10-CM

## 2014-02-26 NOTE — Telephone Encounter (Signed)
Pt called wants to see MD, I transferred to RN to triage

## 2014-02-26 NOTE — Telephone Encounter (Signed)
Patient called with c/o multiple symptoms including weakness, muscle cramps in legs, hot flashes with accompanying sweats and chills without fever. She wants her blood work done to evaluate for any possible issues. Spoke to Dr Marin Olp. Will bring in tomorrow for lab appointment.

## 2014-02-27 ENCOUNTER — Other Ambulatory Visit (HOSPITAL_BASED_OUTPATIENT_CLINIC_OR_DEPARTMENT_OTHER): Payer: Self-pay | Admitting: Lab

## 2014-02-27 DIAGNOSIS — D473 Essential (hemorrhagic) thrombocythemia: Secondary | ICD-10-CM

## 2014-02-27 DIAGNOSIS — D510 Vitamin B12 deficiency anemia due to intrinsic factor deficiency: Secondary | ICD-10-CM

## 2014-02-27 DIAGNOSIS — D75839 Thrombocytosis, unspecified: Secondary | ICD-10-CM

## 2014-02-27 DIAGNOSIS — D509 Iron deficiency anemia, unspecified: Secondary | ICD-10-CM

## 2014-02-27 DIAGNOSIS — N9489 Other specified conditions associated with female genital organs and menstrual cycle: Secondary | ICD-10-CM

## 2014-02-27 LAB — CBC WITH DIFFERENTIAL (CANCER CENTER ONLY)
BASO#: 0 10*3/uL (ref 0.0–0.2)
BASO%: 0.2 % (ref 0.0–2.0)
EOS ABS: 0.2 10*3/uL (ref 0.0–0.5)
EOS%: 1.7 % (ref 0.0–7.0)
HCT: 38.2 % (ref 34.8–46.6)
HGB: 13.1 g/dL (ref 11.6–15.9)
LYMPH#: 2.3 10*3/uL (ref 0.9–3.3)
LYMPH%: 17.1 % (ref 14.0–48.0)
MCH: 33.3 pg (ref 26.0–34.0)
MCHC: 34.3 g/dL (ref 32.0–36.0)
MCV: 97 fL (ref 81–101)
MONO#: 0.6 10*3/uL (ref 0.1–0.9)
MONO%: 4.6 % (ref 0.0–13.0)
NEUT%: 76.4 % (ref 39.6–80.0)
NEUTROS ABS: 10.3 10*3/uL — AB (ref 1.5–6.5)
PLATELETS: 332 10*3/uL (ref 145–400)
RBC: 3.93 10*6/uL (ref 3.70–5.32)
RDW: 11.8 % (ref 11.1–15.7)
WBC: 13.5 10*3/uL — AB (ref 3.9–10.0)

## 2014-02-28 LAB — COMPREHENSIVE METABOLIC PANEL
ALT: 11 U/L (ref 0–35)
AST: 18 U/L (ref 0–37)
Albumin: 4.1 g/dL (ref 3.5–5.2)
Alkaline Phosphatase: 69 U/L (ref 39–117)
BILIRUBIN TOTAL: 0.2 mg/dL (ref 0.2–1.2)
BUN: 19 mg/dL (ref 6–23)
CHLORIDE: 104 meq/L (ref 96–112)
CO2: 24 mEq/L (ref 19–32)
Calcium: 9.1 mg/dL (ref 8.4–10.5)
Creatinine, Ser: 0.75 mg/dL (ref 0.50–1.10)
Glucose, Bld: 103 mg/dL — ABNORMAL HIGH (ref 70–99)
Potassium: 3.9 mEq/L (ref 3.5–5.3)
SODIUM: 139 meq/L (ref 135–145)
TOTAL PROTEIN: 6.4 g/dL (ref 6.0–8.3)

## 2014-02-28 LAB — VITAMIN B12: Vitamin B-12: 256 pg/mL (ref 211–911)

## 2014-03-02 ENCOUNTER — Telehealth: Payer: Self-pay | Admitting: *Deleted

## 2014-03-02 LAB — FERRITIN CHCC: Ferritin: 49 ng/ml (ref 9–269)

## 2014-03-02 LAB — TSH CHCC: TSH: 0.584 m(IU)/L (ref 0.308–3.960)

## 2014-03-02 LAB — IRON AND TIBC CHCC
%SAT: 14 % — AB (ref 21–57)
Iron: 46 ug/dL (ref 41–142)
TIBC: 335 ug/dL (ref 236–444)
UIBC: 289 ug/dL (ref 120–384)

## 2014-03-02 NOTE — Telephone Encounter (Signed)
-----   Message from Volanda Napoleon, MD sent at 03/02/2014  8:22 AM EST ----- Call - labs are ok!!  pete

## 2014-03-03 ENCOUNTER — Encounter: Payer: Self-pay | Admitting: Nurse Practitioner

## 2014-03-05 ENCOUNTER — Telehealth: Payer: Self-pay | Admitting: Nurse Practitioner

## 2014-03-05 NOTE — Telephone Encounter (Addendum)
Pt verbalized understanding and will go and get some OTC iron before taking anything prescribed or by IV.    ----- Message from Ashley Napoleon, MD sent at 03/04/2014  6:44 PM EST ----- Please call and tell her that the iron levels are on the low side. If she taking any iron.??? Her thyroid test is okay. Her B-12 test is okay. Thanks

## 2014-07-28 ENCOUNTER — Telehealth: Payer: Self-pay | Admitting: Obstetrics and Gynecology

## 2014-07-28 NOTE — Telephone Encounter (Signed)
Spoke with patient. Patient states that over the last three weeks she has had increased hot flashes and headaches. Feels this is related to hormone levels. Noticing headaches with hot flashes both of which are intermittent. Appointment scheduled for OV with Dr.Silva on 07/31/2014 at 1pm. Patient is agreeable to date and time.  Routing to provider for final review. Patient agreeable to disposition. Will close encounter.   Patient aware provider will review message and nurse will return call if any additional advice or change of disposition.

## 2014-07-28 NOTE — Telephone Encounter (Signed)
Patient wants to talk with the nurse she is having menopause issues sweating and headaches. Patient wants to schedule an appointment with Dr. Quincy Simmonds

## 2014-07-31 ENCOUNTER — Ambulatory Visit: Payer: Self-pay | Admitting: Obstetrics and Gynecology

## 2014-08-12 ENCOUNTER — Ambulatory Visit: Payer: Self-pay | Admitting: Obstetrics and Gynecology

## 2014-08-12 ENCOUNTER — Telehealth: Payer: Self-pay | Admitting: Obstetrics and Gynecology

## 2014-08-12 NOTE — Telephone Encounter (Signed)
Patient canceled her appointment today for "menopausal symptoms" and rescheduled today due to "stomach issues". Patient rescheduled to 08/14/14.

## 2014-08-12 NOTE — Telephone Encounter (Signed)
Thank you for the update.  Encounter closed. 

## 2014-08-14 ENCOUNTER — Telehealth: Payer: Self-pay | Admitting: Obstetrics and Gynecology

## 2014-08-14 ENCOUNTER — Ambulatory Visit: Payer: Self-pay | Admitting: Obstetrics and Gynecology

## 2014-08-14 NOTE — Telephone Encounter (Signed)
Patient dnka her appointment today for "consult menopausal symptoms". FYI: appointment rescheduled from 08/12/14.

## 2014-08-17 NOTE — Telephone Encounter (Signed)
This is the patient's third failed appointment that I can see.  She will be discharged from the practice for any further missed appointment.

## 2014-11-25 ENCOUNTER — Ambulatory Visit: Payer: 59 | Admitting: Obstetrics and Gynecology

## 2014-11-25 ENCOUNTER — Telehealth: Payer: Self-pay | Admitting: Obstetrics and Gynecology

## 2014-11-25 NOTE — Telephone Encounter (Signed)
Thank you for the update!

## 2014-11-25 NOTE — Telephone Encounter (Signed)
Patient came in for her appointment and had to cancel due to insurance issues. Rescheduled for 11/21/16rd

## 2014-11-27 ENCOUNTER — Encounter: Payer: Self-pay | Admitting: Obstetrics and Gynecology

## 2014-11-30 ENCOUNTER — Ambulatory Visit: Payer: Self-pay | Admitting: Obstetrics and Gynecology

## 2014-12-11 ENCOUNTER — Other Ambulatory Visit: Payer: Self-pay

## 2014-12-11 DIAGNOSIS — Z1231 Encounter for screening mammogram for malignant neoplasm of breast: Secondary | ICD-10-CM

## 2014-12-16 ENCOUNTER — Other Ambulatory Visit (HOSPITAL_BASED_OUTPATIENT_CLINIC_OR_DEPARTMENT_OTHER): Payer: Self-pay | Admitting: Physician Assistant

## 2014-12-16 DIAGNOSIS — R1900 Intra-abdominal and pelvic swelling, mass and lump, unspecified site: Secondary | ICD-10-CM

## 2014-12-19 ENCOUNTER — Ambulatory Visit (HOSPITAL_BASED_OUTPATIENT_CLINIC_OR_DEPARTMENT_OTHER): Payer: Self-pay

## 2014-12-22 ENCOUNTER — Telehealth: Payer: Self-pay | Admitting: Obstetrics and Gynecology

## 2014-12-22 NOTE — Telephone Encounter (Signed)
Patient would like to speak with Dr Quincy Simmonds about possible having stomach surgery.

## 2014-12-22 NOTE — Telephone Encounter (Signed)
Spoke with patient. Patient states that she is scheduled to have a MRI of her abdomen/pelvis on 12/26/2014. Patient has an Essure in place and is concerned about having an MRI. "I have read a lot of things and I am worried about having an MRI with my Essure." Patient requests that I speak with Dr.Silva directly in regards to her recommendations about proceeding with the MRI. Advised I will send a message over to Dr.Silva and return call with further recommendations. Patient is aware Dr.Silva is out of the office today.

## 2014-12-22 NOTE — Telephone Encounter (Signed)
Patient will need to inform the provider ordering the MRI that she has the Essure.  He/she can contact the radiologist to determine if this will represent a contraindication to her testing.  I cannot speak to the specific MRI (magnet) being used.  It is important to know that the Essure will produce artifact on the images due to its metal content.

## 2014-12-23 NOTE — Telephone Encounter (Signed)
Spoke with patient. Advised of message as seen below from Barry. Patient is agreeable and verbalizes understanding. Will contact ordering provider regarding having the imaging with her Essure.  Routing to provider for final review. Patient agreeable to disposition. Will close encounter.

## 2014-12-23 NOTE — Telephone Encounter (Signed)
Returned call

## 2014-12-23 NOTE — Telephone Encounter (Signed)
Left message to call Kaitlyn at [phone]. 

## 2014-12-26 ENCOUNTER — Ambulatory Visit (HOSPITAL_BASED_OUTPATIENT_CLINIC_OR_DEPARTMENT_OTHER): Payer: 59

## 2014-12-29 ENCOUNTER — Ambulatory Visit: Payer: Self-pay | Admitting: Obstetrics and Gynecology

## 2015-01-13 ENCOUNTER — Ambulatory Visit
Admission: RE | Admit: 2015-01-13 | Discharge: 2015-01-13 | Disposition: A | Discharge status: Home / Self Care | Payer: 59 | Source: Ambulatory Visit

## 2015-01-13 DIAGNOSIS — Z1231 Encounter for screening mammogram for malignant neoplasm of breast: Secondary | ICD-10-CM

## 2015-01-18 ENCOUNTER — Ambulatory Visit (INDEPENDENT_AMBULATORY_CARE_PROVIDER_SITE_OTHER): Payer: 59 | Admitting: Obstetrics and Gynecology

## 2015-01-18 ENCOUNTER — Encounter: Payer: Self-pay | Admitting: Obstetrics and Gynecology

## 2015-01-18 VITALS — BP 110/62 | HR 80 | Resp 20 | Ht 63.5 in | Wt 122.6 lb

## 2015-01-18 DIAGNOSIS — N941 Unspecified dyspareunia: Secondary | ICD-10-CM

## 2015-01-18 DIAGNOSIS — N926 Irregular menstruation, unspecified: Secondary | ICD-10-CM

## 2015-01-18 DIAGNOSIS — N951 Menopausal and female climacteric states: Secondary | ICD-10-CM

## 2015-01-18 DIAGNOSIS — Z01419 Encounter for gynecological examination (general) (routine) without abnormal findings: Secondary | ICD-10-CM

## 2015-01-18 DIAGNOSIS — Z0000 Encounter for general adult medical examination without abnormal findings: Secondary | ICD-10-CM | POA: Diagnosis not present

## 2015-01-18 DIAGNOSIS — R531 Weakness: Secondary | ICD-10-CM | POA: Diagnosis not present

## 2015-01-18 LAB — CBC WITH DIFFERENTIAL/PLATELET
Basophils Absolute: 0 10*3/uL (ref 0.0–0.1)
Basophils Relative: 0 % (ref 0–1)
EOS ABS: 0.3 10*3/uL (ref 0.0–0.7)
EOS PCT: 2 % (ref 0–5)
HEMATOCRIT: 44.1 % (ref 36.0–46.0)
Hemoglobin: 15.5 g/dL — ABNORMAL HIGH (ref 12.0–15.0)
LYMPHS ABS: 2.7 10*3/uL (ref 0.7–4.0)
LYMPHS PCT: 16 % (ref 12–46)
MCH: 33.2 pg (ref 26.0–34.0)
MCHC: 35.1 g/dL (ref 30.0–36.0)
MCV: 94.4 fL (ref 78.0–100.0)
MONOS PCT: 4 % (ref 3–12)
MPV: 9.2 fL (ref 8.6–12.4)
Monocytes Absolute: 0.7 10*3/uL (ref 0.1–1.0)
Neutro Abs: 13.1 10*3/uL — ABNORMAL HIGH (ref 1.7–7.7)
Neutrophils Relative %: 78 % — ABNORMAL HIGH (ref 43–77)
PLATELETS: 396 10*3/uL (ref 150–400)
RBC: 4.67 MIL/uL (ref 3.87–5.11)
RDW: 12.9 % (ref 11.5–15.5)
WBC: 16.8 10*3/uL — ABNORMAL HIGH (ref 4.0–10.5)

## 2015-01-18 LAB — POCT URINALYSIS DIPSTICK
Bilirubin, UA: NEGATIVE
Glucose, UA: NEGATIVE
KETONES UA: NEGATIVE
Leukocytes, UA: NEGATIVE
Nitrite, UA: NEGATIVE
PH UA: 5
PROTEIN UA: NEGATIVE
RBC UA: NEGATIVE
Urobilinogen, UA: NEGATIVE

## 2015-01-18 LAB — COMPREHENSIVE METABOLIC PANEL
ALT: 21 U/L (ref 6–29)
AST: 29 U/L (ref 10–35)
Albumin: 4.6 g/dL (ref 3.6–5.1)
Alkaline Phosphatase: 62 U/L (ref 33–130)
BILIRUBIN TOTAL: 0.3 mg/dL (ref 0.2–1.2)
BUN: 9 mg/dL (ref 7–25)
CHLORIDE: 99 mmol/L (ref 98–110)
CO2: 26 mmol/L (ref 20–31)
CREATININE: 0.75 mg/dL (ref 0.50–1.05)
Calcium: 9.6 mg/dL (ref 8.6–10.4)
Glucose, Bld: 156 mg/dL — ABNORMAL HIGH (ref 65–99)
Potassium: 3.9 mmol/L (ref 3.5–5.3)
SODIUM: 138 mmol/L (ref 135–146)
Total Protein: 7.4 g/dL (ref 6.1–8.1)

## 2015-01-18 LAB — VITAMIN B12: Vitamin B-12: 412 pg/mL (ref 211–911)

## 2015-01-18 LAB — TSH: TSH: 1.226 u[IU]/mL (ref 0.350–4.500)

## 2015-01-18 LAB — HEMOGLOBIN, FINGERSTICK: Hemoglobin, fingerstick: 15.3 g/dL (ref 12.0–16.0)

## 2015-01-18 NOTE — Progress Notes (Signed)
Patient ID: Ashley Mosley, female   DOB: 1960/12/14, 55 y.o.   MRN: [id number] 55 y.o. [id number] Married Caucasian female here for annual exam.    Menses 10/21 - 10/30 and 11/10 - 11/29.  No spotting.  Some hot flashes but also having chills. Wants to test hormones.  Has a uterine fibroid - 3.54 cm right fundal.    Some dyspareunia.  Worried about pelvic congestion which was noted on prior CT scan. Worried that her Essure could be causing medical problems for her.   Has an MRI of the abdomen scheduled due to abdominal discomfort.  Had colonoscopy and endoscopy with Dr. Collene Mares.  Hx of polyps.   Had a PET scan ordered by Dr. Marin Olp due to a rib pain and elevated WBC. Slightly increased uptake at T8.  Has weakness.  Wants labs done - declines cholesterol.  In counseling regarding her marriage.   PCP:   [place]   Patient's last menstrual period was 11/19/2014 (exact date).          Sexually active: Yes.    The current method of family planning is tubal ligation.  Essure.  Exercising: No.  exercise Smoker:  yes  Health Maintenance: Pap:  11-21-13 HPV HR neg History of abnormal Pap:  Yes, Abnormal pap x3 from 2007-2011 with colposcopy. MMG:  01-13-15 catgory c birads 1:neg Colonoscopy:  2016 polyps BMD:   none  Result   TDaP:  2007 Screening Labs:  Hb today: 15.3, Urine today: neg   reports that she has been smoking Cigarettes.  She started smoking about 36 years ago. She has a 17.5 pack-year smoking history. She has never used smokeless tobacco. She reports that she does not drink alcohol or use illicit drugs.  Past Medical History  Diagnosis Date  . Abnormal uterine bleeding   . Anemia   . Anxiety   . Blood transfusion without reported diagnosis     age 56  . Depression   . Dysmenorrhea   . Endometriosis   . [place] spotted fever 1970    hospitalized x 1 month, had a blood transfusion  . Abnormal Pap smear 2007 2012  . Gastric ulcer 2014  . Thrombocytosis ([place])  2014  . Elevated hemoglobin A1c 2014  . Nervous breakdown 07/2013    [facility] in [place]  . Fibroid 2012    3.54 cm right fundal fibroid on pelvic ultrasound 2015.    Past Surgical History  Procedure Laterality Date  . Cesarean section  1993  . Pelvic laparoscopy    . Colposcopy  2010  . Essure tubal ligation  2010  . Hysteroscopy w/d&c N/A 12/17/2012    Procedure: DILATATION AND CURETTAGE /HYSTEROSCOPY AND RESECTOSCOPE;  Surgeon: Jamey Reas de Berton Lan, MD;  Location: [facility];  Service: Gynecology;  Laterality: N/A;  . Tubal ligation      Essure    Current Outpatient Prescriptions  Medication Sig Dispense Refill  . Cyanocobalamin (B-12 PO) Take by mouth daily.    . hydrOXYzine (ATARAX/VISTARIL) 10 MG tablet Take 10 mg by mouth.    . Multiple Vitamins-Minerals (MULTIVITAL) CHEW Chew by mouth every morning.     No current facility-administered medications for this visit.    Family History  Problem Relation Age of Onset  . Diabetes Father     ROS:  Pertinent items are noted in HPI.  Otherwise, a comprehensive ROS was negative.  Exam:   BP 110/62 mmHg  Pulse 80  Resp  20  Ht 5' 3.5" (1.613 m)  Wt 122 lb 9.6 oz (55.611 kg)  BMI 21.37 kg/m2  LMP 11/19/2014 (Exact Date)    General appearance: alert, cooperative and appears stated age Head: Normocephalic, without obvious abnormality, atraumatic Neck: no adenopathy, supple, symmetrical, trachea midline and thyroid normal to inspection and palpation Lungs: clear to auscultation bilaterally Breasts: normal appearance, no masses or tenderness, Inspection negative, No nipple retraction or dimpling, No nipple discharge or bleeding, No axillary or supraclavicular adenopathy Heart: regular rate and rhythm Abdomen: soft, non-tender; bowel sounds normal; no masses,  no organomegaly Extremities: extremities normal, atraumatic, no cyanosis or edema Skin: Skin color, texture, turgor normal. No rashes or  lesions Lymph nodes: Cervical, supraclavicular, and axillary nodes normal. No abnormal inguinal nodes palpated Neurologic: Grossly normal  Pelvic: External genitalia:  no lesions              Urethra:  normal appearing urethra with no masses, tenderness or lesions              Bartholins and Skenes: normal                 Vagina: normal appearing vagina with normal color and discharge, no lesions              Cervix: no lesions              Pap taken: Yes.   Bimanual Exam:  Uterus:  Normal size and tenderness suprapubically with uterine palpation.              Adnexa: normal adnexa and no mass, fullness, tenderness              Rectovaginal: Yes.  .  Confirms.              Anus:  normal sphincter tone, no lesions  Chaperone was present for exam.  Assessment:   Well woman visit. Hx Essure.  Hx abnormal paps.  Irregular menses.  Menopausal symptoms.  Abdominal/rib pain.  MRI appointment pending.  Dyspareunia. Pelvic congestion seen on CT pelvis 05/14/13. Uterine fibroid. Hx elevated WBC.  Oncology attributing this to smoking. Weakness.  Plan: Yearly mammogram recommended after age 34.  Recommended self breast exam.  Pap and HR HPV as above. Discussed Calcium, Vitamin D, regular exercise program including cardiovascular and weight bearing exercise. Labs performed.  Yes.  .   See orders.  Routine labs (excluding cholesterol), TSH, FSH, estradiol, Vit B12. Declines STD testing.  Refills given on medications.  No..  Return for pelvic ultrasound.  Follow up annually and prn.   15 minutes of additional counseling given regarding fibroid, pelvic congestion, dyspareunia, menopausal symptoms, irregular menses, Essure and weakness.  Over 50% was spent in counseling.  Will check FSH, TSH, and estradiol.   Return for pelvic ultrasound.  Complete abdominal MRI.  After visit summary provided.

## 2015-01-18 NOTE — Patient Instructions (Signed)

## 2015-01-19 ENCOUNTER — Telehealth: Payer: Self-pay

## 2015-01-19 LAB — IPS PAP TEST WITH HPV

## 2015-01-19 LAB — FOLLICLE STIMULATING HORMONE: FSH: 68.6 m[IU]/mL

## 2015-01-19 LAB — ESTRADIOL: Estradiol: 102.8 pg/mL

## 2015-01-19 NOTE — Telephone Encounter (Signed)
Spoke with patient. Results given as seen below from [place]. Patient is agreeable. Patient will call to schedule an appointment with her oncologist at this time for follow up. States that she was not fasting for her lab work and had eaten peanut butter crackers and a candy bar for lunch. Advised if her glucose remains elevated with fasting lab work she will need to seek further evaluation as this places her at an increased risk for cardiovascular disease in the future. Patient is agreeable. Declines follow up at this time.   Routing to provider for final review. Patient agreeable to disposition. Will close encounter.

## 2015-01-19 NOTE — Telephone Encounter (Signed)
-----   Message from Nunzio Cobbs, MD sent at 01/19/2015 11:02 AM EST ----- Please contact patient with her lab results.  She is not menopausal.  Her white blood cell count is elevated further, and she will need to return to her oncologist for further recommendations.  Her glucose is also elevated, and I would like for her to see her PCP. Her TSH, vit B12, and other blood chemistries were normal.   Cc- Marisa Sprinkles

## 2015-01-22 ENCOUNTER — Telehealth: Payer: Self-pay | Admitting: Obstetrics and Gynecology

## 2015-01-22 NOTE — Telephone Encounter (Signed)
Spoke with pt regarding benefit for ultrasound. Patient understood and agreeable. Patient ready to schedule. Patient scheduled 02/04/15 with Quincy Simmonds. Pt aware of arrival date and time. Pt aware of 72 hours cancellation policy with [id number] fee. No further questions. Ok to close

## 2015-01-23 ENCOUNTER — Ambulatory Visit (HOSPITAL_BASED_OUTPATIENT_CLINIC_OR_DEPARTMENT_OTHER)
Admission: RE | Admit: 2015-01-23 | Discharge: 2015-01-23 | Disposition: A | Discharge status: Home / Self Care | Payer: 59 | Source: Ambulatory Visit | Attending: Physician Assistant | Admitting: Physician Assistant

## 2015-01-23 DIAGNOSIS — R1900 Intra-abdominal and pelvic swelling, mass and lump, unspecified site: Secondary | ICD-10-CM | POA: Diagnosis present

## 2015-01-23 DIAGNOSIS — R109 Unspecified abdominal pain: Secondary | ICD-10-CM | POA: Insufficient documentation

## 2015-01-23 MED ORDER — GADOBENATE DIMEGLUMINE 529 MG/ML IV SOLN
10.0000 mL | Freq: Once | INTRAVENOUS | Status: AC | PRN
  Administered 2015-01-23: 10 mL via INTRAVENOUS

## 2015-01-27 ENCOUNTER — Encounter: Payer: Self-pay | Admitting: Obstetrics and Gynecology

## 2015-01-27 ENCOUNTER — Ambulatory Visit (INDEPENDENT_AMBULATORY_CARE_PROVIDER_SITE_OTHER): Payer: 59 | Admitting: Obstetrics and Gynecology

## 2015-01-27 VITALS — BP 134/76 | HR 90 | Resp 16 | Ht 63.5 in | Wt 121.6 lb

## 2015-01-27 DIAGNOSIS — D72829 Elevated white blood cell count, unspecified: Secondary | ICD-10-CM

## 2015-01-27 DIAGNOSIS — N760 Acute vaginitis: Secondary | ICD-10-CM

## 2015-01-27 DIAGNOSIS — N889 Noninflammatory disorder of cervix uteri, unspecified: Secondary | ICD-10-CM | POA: Diagnosis not present

## 2015-01-27 NOTE — Progress Notes (Signed)
Call to [facility] and left message for scheduler to return my call. Advised patient will return call to patient with appointment for Dr. Marin Olp. Patient agreeable.

## 2015-01-27 NOTE — Progress Notes (Signed)
134/[id number] 16Patient ID: Ashley Mosley, female   DOB: 08/19/1960, 55 y.o.   MRN: [id number] GYNECOLOGY  VISIT   HPI: 55 y.o.   Married  Caucasian  female   [phone] with Patient's last menstrual period was 11/19/2014 (exact date).   here for evaluation of vaginal infection.  Patient's husband has 2-3 red "sores" on his penis and he told her this was probably because she was having vaginal dryness or a yeast infection. Patient has no symptoms of a yeast infection, no discharge, no itching or irritation or sores but she does have vaginal dryness.  Patient is very concerned.  Has multiple concerns new and previous ongoing issues:  1.  Took PCN in December for abscessed tooth. Wants a vaginitis exam today.  Declines STD evaluation at this time.    2.  Had MRI of abdomen last weekend for left abdominal pain, and this was normal.   3.  Had elevated glucose here at her annual exam.  States she had a fairly recent hemoglobin A1C which was borderline elevated.  Dr. Conni Slipper.  4.  Elevated WBC count.  16.8 with 78% neutrophils.  4.  Has an appointment here for check of uterine fibroid next week due to dyspareunia and hx of uterine fibroid.  GYNECOLOGIC HISTORY: Patient's last menstrual period was 11/19/2014 (exact date). Contraception:Tubal Menopausal hormone therapy: None Last mammogram: 01-13-15 3D/Density Cat.C/Neg/BiRads1:[facility] Last pap smear: 01-18-15 Neg:Neg HR HPV        OB History    Gravida Para Term Preterm AB TAB SAB Ectopic Multiple Living   5 3 2 1 2 2    3          Patient Active Problem List   Diagnosis Date Noted  . Elevated hemoglobin A1c 12/11/2012  . Thrombocytosis ([place]) 12/11/2012  . Uterine fibroid 11/07/2012  . Endometrial mass 11/07/2012  . Anxiety state, unspecified 07/22/2012  . Paranoid schizophrenia ([place]) 07/22/2012  . Major depressive disorder, single episode, severe, specified as with psychotic behavior 07/22/2012    Past Medical History  Diagnosis  Date  . Abnormal uterine bleeding   . Anemia   . Anxiety   . Blood transfusion without reported diagnosis     age 53  . Depression   . Dysmenorrhea   . Endometriosis   . [place] spotted fever 1970    hospitalized x 1 month, had a blood transfusion  . Abnormal Pap smear 2007 2012  . Gastric ulcer 2014  . Thrombocytosis ([place]) 2014  . Elevated hemoglobin A1c 2014  . Nervous breakdown 07/2013    [facility] in [place]  . Fibroid 2012    3.54 cm right fundal fibroid on pelvic ultrasound 2015.    Past Surgical History  Procedure Laterality Date  . Cesarean section  1993  . Pelvic laparoscopy    . Colposcopy  2010  . Essure tubal ligation  2010  . Hysteroscopy w/d&c N/A 12/17/2012    Procedure: DILATATION AND CURETTAGE /HYSTEROSCOPY AND RESECTOSCOPE;  Surgeon: Jamey Reas de Berton Lan, MD;  Location: [facility];  Service: Gynecology;  Laterality: N/A;  . Tubal ligation      Essure    Current Outpatient Prescriptions  Medication Sig Dispense Refill  . Cyanocobalamin (B-12 PO) Take by mouth daily.    . hydrOXYzine (ATARAX/VISTARIL) 10 MG tablet Take 10 mg by mouth.    . Multiple Vitamins-Minerals (MULTIVITAL) CHEW Chew by mouth every morning.     No current facility-administered medications  for this visit.     ALLERGIES: Codeine  Family History  Problem Relation Age of Onset  . Diabetes Father     Social History   Social History  . Marital Status: Married    Spouse Name: N/A  . Number of Children: N/A  . Years of Education: N/A   Occupational History  . Not on file.   Social History Main Topics  . Smoking status: Current Every Day Smoker -- 0.50 packs/day for 35 years    Types: Cigarettes    Start date: 12/09/1978  . Smokeless tobacco: Never Used     Comment: 11-24-13  still smoking  . Alcohol Use: No  . Drug Use: No  . Sexual Activity:    Partners: Male    Birth Control/ Protection: Other-see comments     Comment: Essure   Other  Topics Concern  . Not on file   Social History Narrative    ROS:  Pertinent items are noted in HPI.  PHYSICAL EXAMINATION:    BP 134/76 mmHg  Pulse 90  Resp 16  Ht 5' 3.5" (1.613 m)  Wt 121 lb 9.6 oz (55.157 kg)  BMI 21.20 kg/m2  LMP 11/19/2014 (Exact Date)    General appearance: alert, cooperative and appears stated age  Pelvic: External genitalia:  no lesions              Urethra:  normal appearing urethra with no masses, tenderness or lesions              Bartholins and Skenes: normal                 Vagina: normal appearing vagina with normal color and discharge, no lesions              Cervix:  Slight menstrual flow.  White change at 12:00 and at 8:00.  Condylomatous change?            Bimanual Exam:  Uterus:  normal size, contour, position, consistency, mobility, non-tender              Adnexa: normal adnexa and no mass, fullness, tenderness          Chaperone was present for exam.  ASSESSMENT  Possible vaginitis.  Abnormal cervical appearance.  Change suggestive of condylomatous change versus scar.   Recent normal pap and negative HR HPV.  Hx of abnormal pap. Elevated glucose.  Elevated Neutrophil count.  Dyspareunia.  Hx Essure.  Hx of fibroid.  PLAN  Affirm testing done.  Discussion of cervical appearance.  I am recommending colposcopy.  Low sugar and low carb diet.  Return visit to Dr. Marin Olp for evaluation of neutrophilia.  Return for pelvic ultrasound.   An After Visit Summary was printed and given to the patient.  _25_____ minutes face to face time of which over 50% was spent in counseling.

## 2015-01-28 ENCOUNTER — Telehealth: Payer: Self-pay | Admitting: Emergency Medicine

## 2015-01-28 LAB — WET PREP BY MOLECULAR PROBE
Candida species: NEGATIVE
Gardnerella vaginalis: NEGATIVE
TRICHOMONAS VAG: NEGATIVE

## 2015-01-28 NOTE — Telephone Encounter (Signed)
Spoke with patient.  Results from Dr. Quincy Simmonds given. Patient concerns about husbands symptoms, advised to have husband see his PCP.  Ultrasound is scheduled for 02/04/15 with Dr. Quincy Simmonds.  Patient agreeable to scheduling colposcopy.  Instructions given. Motrin 800 mg po one hour before appointment with food. Make sure to eat a meal before appointment and drink plenty of fluids. Patient verbalized understanding and will call to reschedule if will be on menses.  Patient agreeable and verbalized understanding of all instructions. Scheduled colposcopy for 02/11/15 with Dr. Quincy Simmonds.  Patient is advised I spoke with Dr. Antonieta Pert office yesterday and they will contact her to schedule appointment because referral had to be processed. Referral in place and they will contact patient directly .  cc Lerry Liner for insurance pre-certification of colposcopy.

## 2015-01-28 NOTE — Telephone Encounter (Signed)
-----   Message from Nunzio Cobbs, MD sent at 01/28/2015  7:41 AM EST ----- Please inform patient of her negative Affirm. She was very concerned when she came in yesterday. She is returning for a pelvic ultrasound next week, and I recommended a colposcopy to look at her cervix, which had some white markings that need further evaluation.  (Pap was actually normal.)  Thanks.   Cc- Marisa Sprinkles

## 2015-02-04 ENCOUNTER — Ambulatory Visit (INDEPENDENT_AMBULATORY_CARE_PROVIDER_SITE_OTHER): Payer: 59 | Admitting: Obstetrics and Gynecology

## 2015-02-04 ENCOUNTER — Encounter: Payer: Self-pay | Admitting: Obstetrics and Gynecology

## 2015-02-04 ENCOUNTER — Ambulatory Visit (INDEPENDENT_AMBULATORY_CARE_PROVIDER_SITE_OTHER): Payer: 59

## 2015-02-04 VITALS — BP 100/62 | HR 84 | Ht 63.5 in | Wt 119.0 lb

## 2015-02-04 DIAGNOSIS — N941 Unspecified dyspareunia: Secondary | ICD-10-CM

## 2015-02-04 DIAGNOSIS — D259 Leiomyoma of uterus, unspecified: Secondary | ICD-10-CM

## 2015-02-04 DIAGNOSIS — N83202 Unspecified ovarian cyst, left side: Secondary | ICD-10-CM | POA: Diagnosis not present

## 2015-02-04 NOTE — Progress Notes (Signed)
Subjective  55 y.o. [id number]  Married caucasian female here for pelvic ultrasound for dyspareunia and recheck of fundal fibroid.  Husband is present for the entire discussion today.   Has a uterine fibroid - 3.54 cm right fundal based on prior ultrasound in 2015.  Some dyspareunia. No real dryness. Worried about pelvic congestion which was noted on prior CT scan. Worried that her Essure could be causing medical problems for her.   Scheduled for colpo on 02/11/15 due to abnormal appearance of cervix at last exam.  Normal pap and positive HR HPV. Had a negative Affirm test at last office visit.   She has an appointment with Dr. Marin Olp regarding her elevated WBC.  Objective  Pelvic ultrasound images and report reviewed with patient.  Uterus - 2.89. 0.64, 0.73 cm intramural fibroids.  Essure noted bilaterally.  EMS - 3.94 mm. Ovaries - simple left ovarian cyst 2.5 cm.  Normal right ovary.  Free fluid - no       Assessment  Uterine fibroids.  Simple left ovarian cyst.  Dyspareunia. Abnormal cervical appearance on last exam. Normal pap and negative HR HPV.  Elevated WBC.   Plan  Discussion of fibroids and ovarian cysts, and the benign nature of these.  Discussed rare risk of sarcoma.  Discussed dyspareunia, etiologies and treatments.  I am recommending lubricants, position changes, and return if the dyspareunia persists.  Will proceed with colposcopy next week due to appearance of her cervix.  Follow up with Dr. Marin Olp.  ___25____ minutes face to face time of which over 50% was spent in counseling.   After visit summary to patient.

## 2015-02-11 ENCOUNTER — Ambulatory Visit (INDEPENDENT_AMBULATORY_CARE_PROVIDER_SITE_OTHER): Payer: 59 | Admitting: Obstetrics and Gynecology

## 2015-02-11 ENCOUNTER — Encounter: Payer: Self-pay | Admitting: Obstetrics and Gynecology

## 2015-02-11 VITALS — BP 128/70 | HR 88 | Ht 63.5 in | Wt 121.0 lb

## 2015-02-11 DIAGNOSIS — N889 Noninflammatory disorder of cervix uteri, unspecified: Secondary | ICD-10-CM

## 2015-02-11 DIAGNOSIS — F17200 Nicotine dependence, unspecified, uncomplicated: Secondary | ICD-10-CM | POA: Diagnosis not present

## 2015-02-11 NOTE — Patient Instructions (Signed)

## 2015-02-11 NOTE — Progress Notes (Signed)
Subjective:     Patient ID: Ashley Mosley, female   DOB: [birth year], 55 y.o.   MRN: [id number]  HPI  Patient here today for colposcopy due to abnormal appearance of cervix on last exam.  History of abnormal Pap: Yes, Abnormal pap x3 from 2007-2011 with colposcopy.  Patient is very concerned about penile lesions of her husband which appear to have resolved.  She states he is not open to asking for help for medical problems.  Denies hx of HSV I. Patient had negative Affirm testing.  She has declined STD testing.  Some discomfort along her right vaginal wall at the apex.   Smoker.   Review of Systems  LMP: 01-29-15 Contraception:Essure Last Pap: 01-18-15 Neg:Neg HR HPV     Objective:   Physical Exam  Genitourinary:    Procedure - colposcopy of the cervix and vagina.  Consent for procedure.  Speculum placed in vagina.  White areas noted on cervix at 12:00.  3% acetic acid placed. Satisfactory colposcopy.  Acetowhite change at 12:00 and 7:00.  Punctations at 11:00.  No vaginal lesions noted.  Biopsies taken:  ECC, biopsy exocervix at 7:00, biopsy exocervix at 11:00, and biopsy of exocervix 12:00 - all sent to pathology separately.  Monsel's placed.  Minimal EBL.  No complications.     Assessment:     Abnormal appearance of the cervix.  I suspect HPV changes.  Recent negative pap and negative HR HPV.  Hx of prior abnormal pap.  Smoker.     Plan:     Discussion of abnormal paps, HPV, cervical dysplasia, Gardasil, and treatment of dysplasia with excision/  Recommended smoking cessation.  Follow up biopsy results.  Post colposcopy instructions and precautions given.   ____15___ minutes face to face time of which over 50% was spent in counseling.   After visit summary to patient.

## 2015-02-15 ENCOUNTER — Ambulatory Visit: Payer: 59

## 2015-02-15 ENCOUNTER — Ambulatory Visit (HOSPITAL_BASED_OUTPATIENT_CLINIC_OR_DEPARTMENT_OTHER): Payer: 59 | Admitting: Hematology & Oncology

## 2015-02-15 ENCOUNTER — Other Ambulatory Visit (HOSPITAL_BASED_OUTPATIENT_CLINIC_OR_DEPARTMENT_OTHER): Payer: 59

## 2015-02-15 VITALS — BP 132/64 | HR 87 | Temp 98.0°F | Resp 20 | Ht 64.0 in | Wt 120.0 lb

## 2015-02-15 DIAGNOSIS — D75839 Thrombocytosis, unspecified: Secondary | ICD-10-CM

## 2015-02-15 DIAGNOSIS — D473 Essential (hemorrhagic) thrombocythemia: Secondary | ICD-10-CM

## 2015-02-15 DIAGNOSIS — R1012 Left upper quadrant pain: Secondary | ICD-10-CM | POA: Diagnosis not present

## 2015-02-15 DIAGNOSIS — D72829 Elevated white blood cell count, unspecified: Secondary | ICD-10-CM | POA: Diagnosis not present

## 2015-02-15 DIAGNOSIS — Z720 Tobacco use: Secondary | ICD-10-CM | POA: Diagnosis not present

## 2015-02-15 LAB — CBC WITH DIFFERENTIAL (CANCER CENTER ONLY)
BASO#: 0 10*3/uL (ref 0.0–0.2)
BASO%: 0.3 % (ref 0.0–2.0)
EOS%: 2.7 % (ref 0.0–7.0)
Eosinophils Absolute: 0.3 10*3/uL (ref 0.0–0.5)
HEMATOCRIT: 42.6 % (ref 34.8–46.6)
HGB: 14.1 g/dL (ref 11.6–15.9)
LYMPH#: 2.8 10*3/uL (ref 0.9–3.3)
LYMPH%: 23.8 % (ref 14.0–48.0)
MCH: 32.3 pg (ref 26.0–34.0)
MCHC: 33.1 g/dL (ref 32.0–36.0)
MCV: 98 fL (ref 81–101)
MONO#: 0.7 10*3/uL (ref 0.1–0.9)
MONO%: 6.3 % (ref 0.0–13.0)
NEUT#: 7.8 10*3/uL — ABNORMAL HIGH (ref 1.5–6.5)
NEUT%: 66.9 % (ref 39.6–80.0)
Platelets: 377 10*3/uL (ref 145–400)
RBC: 4.36 10*6/uL (ref 3.70–5.32)
RDW: 12.2 % (ref 11.1–15.7)
WBC: 11.7 10*3/uL — ABNORMAL HIGH (ref 3.9–10.0)

## 2015-02-15 LAB — IPS OTHER TISSUE BIOPSY

## 2015-02-15 LAB — CHCC SATELLITE - SMEAR

## 2015-02-15 NOTE — Progress Notes (Signed)
Hematology and Oncology Follow Up Visit  KYRIEL ZONG [id number] [birth year] 55 y.o. 02/15/2015   Principle Diagnosis:   Transient Leukocytosis - chronic  Current Therapy:    Observation     Interim History:  Ms. Foland is back for a follow-up. We have not seen her probably for a year and a half. In the past, we have seen her for some leukocytosis. She also hasn't thrombocytosis. She is found to have some iron deficiency. She saw her gynecologist. A CBC was done which showed otherwise accounted be 16,000. The gynecologist was worried that there is something going on and as such, kindly referred her back to [place].  It is hard to say exactly what is going on with Ms. Henrene Pastor. She apparently has had some biopsies. Other she had a colposcopy. I'll be any pathology report is back yet.  She still has this left upper quadrant discomfort. This is been evaluated thoroughly. No obvious problem has been found.  She still smokes. She's had no cough. She's had no weight loss. She's had no fever.  She's had no rashes. There's not been any change in medications.  She is still working. She works in [employer].  Overall, she has a performance status of ECOG 1.  Medications:  Current outpatient prescriptions:  .  Cyanocobalamin (B-12 PO), Take by mouth daily., Disp: , Rfl:  .  hydrOXYzine (ATARAX/VISTARIL) 10 MG tablet, Take 10 mg by mouth as needed. , Disp: , Rfl:  .  Multiple Vitamins-Minerals (MULTIVITAL) CHEW, Chew by mouth every morning., Disp: , Rfl:   Allergies:  Allergies  Allergen Reactions  . Codeine Nausea Only    Past Medical History, Surgical history, Social history, and Family History were reviewed and updated.  Review of Systems: As above  Physical Exam:  height is 5\' 4"  (1.626 m) and weight is 120 lb (54.432 kg). Her oral temperature is 98 F (36.7 C). Her blood pressure is 132/64 and her pulse is 87. Her respiration is 20.   Wt Readings from Last 3 Encounters:  02/15/15  120 lb (54.432 kg)  02/11/15 121 lb (54.885 kg)  02/04/15 119 lb (53.978 kg)     Well-developed and well-nourished white female. Head and neck exam shows no ocular or oral lesions. There are no palpable cervical or supraclavicular lymph nodes. Lungs are clear. Cardiac exam regular rate and rhythm with no murmurs, rubs or bruits. Abdomen is soft. Has good bowel sounds. There is no fluid wave. There is no palpable liver or spleen tip. Back exam shows no tenderness over the spine, ribs or hips. Extremities shows no clubbing cyanosis or edema. Skin exam shows no rashes, ecchymoses or petechia. Neurological exam shows no focal neurological deficit.  Lab Results  Component Value Date   WBC 11.7* 02/15/2015   HGB 14.1 02/15/2015   HCT 42.6 02/15/2015   MCV 98 02/15/2015   PLT 377 02/15/2015     Chemistry      Component Value Date/Time   NA 138 01/18/2015 1518   NA 141 11/24/2013 1323   K 3.9 01/18/2015 1518   K 3.9 11/24/2013 1323   CL 99 01/18/2015 1518   CL 97* 11/24/2013 1323   CO2 26 01/18/2015 1518   CO2 26 11/24/2013 1323   BUN 9 01/18/2015 1518   BUN 10 11/24/2013 1323   CREATININE 0.75 01/18/2015 1518   CREATININE 0.75 02/27/2014 1418      Component Value Date/Time   CALCIUM 9.6 01/18/2015 1518   CALCIUM  9.0 11/24/2013 1323   ALKPHOS 62 01/18/2015 1518   ALKPHOS 54 11/24/2013 1323   AST 29 01/18/2015 1518   AST 26 11/24/2013 1323   ALT 21 01/18/2015 1518   ALT 17 11/24/2013 1323   BILITOT 0.3 01/18/2015 1518   BILITOT 0.50 11/24/2013 1323         Impression and Plan: Ms. Buonocore is a 55 year old white female. We have been following her in the past for mild leukocytosis. Again, we have not found any hematologic malignancy. She has not wished to have a bone marrow test done. I totally agree with this.  I just think that the leukocytosis is reactive. I looked at her blood under the microscope. She has good maturation of her white blood cells. I do not see any blasts.  There is no immature myeloid or lymphoid cells.  I can't explain why she had this elevated white cell count at her gynecologist. I looked at all the labs that we've done on her and her white count really has been minimally elevated but stable.  I really don't think that we have to see her back. Again, I just do not see any indication of a hematologic issue. Maybe she stopped smoking, this might help with the leukocytosis.  It was nice to see her again. She came in with her husband. I spent about 40 minutes with the two.     Volanda Napoleon, MD 2/6/20175:30 PM

## 2015-02-16 ENCOUNTER — Telehealth: Payer: Self-pay

## 2015-02-16 NOTE — Telephone Encounter (Signed)
Patient returned call

## 2015-02-16 NOTE — Telephone Encounter (Signed)
Call to patient. Patient states it is not a good time to talk and she will return call to the office.

## 2015-02-16 NOTE — Telephone Encounter (Signed)
-----   Message from Nunzio Cobbs, MD sent at 02/15/2015  9:48 AM EST ----- Please inform patient of her colposcopy results.  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  There is NO sign of cancer.  All of the rest of the biopsies were normal including the area at 12:00 that I could see with the naked eye, which is what prompted the colposcopy despite her normal pap and negative HR HPV. At her colposcopy visit, we did discuss that paps can be falsely negative.   No treatment is indicated.  I am recommending that she has cotesting in one year. We will determine the need for future colposcopy at the time that her next pap is done.   Cc- Marisa Sprinkles

## 2015-02-16 NOTE — Telephone Encounter (Signed)
Spoke with patient. Advised of results and message as seen below from Dr.Silva. She is agreeable and verbalizes understanding. 08 recall placed.   Routing to provider for final review. Patient agreeable to disposition. Will close encounter.  

## 2015-02-16 NOTE — Telephone Encounter (Signed)
Thank you for entering the 08 recall!!!

## 2015-04-07 ENCOUNTER — Telehealth: Payer: Self-pay | Admitting: Obstetrics and Gynecology

## 2015-04-07 NOTE — Telephone Encounter (Signed)
Patient is wanting Dr. Quincy Simmonds to call in something for her because she keeps having hot flashes. Best # to reach: [phone]

## 2015-04-07 NOTE — Telephone Encounter (Signed)
Message left to return call to [employer]for[employer] Brosnahan at [phone].    

## 2015-04-08 NOTE — Telephone Encounter (Signed)
Patient returning your call.

## 2015-04-08 NOTE — Telephone Encounter (Signed)
Spoke with patient. She states she has been having an increase in hot flashes over the last two weeks. Feels normal and then begins sweating and becomes very hot. This is increasing for her. She has not had a cycle since January of this year.  Patient has had an essure procedure.   She is advised of results from last year, not menopausal.  Advised consult indicated with Dr. Quincy Simmonds to discuss her treatment options.  Patient agreeable. Patient scheduled for consult at her convenience for 04/26/15 at 1530.   Routing to provider for final review. Patient agreeable to disposition. Will close encounter.

## 2015-04-26 ENCOUNTER — Ambulatory Visit: Payer: 59 | Admitting: Obstetrics and Gynecology

## 2015-04-26 ENCOUNTER — Telehealth: Payer: Self-pay | Admitting: *Deleted

## 2015-04-26 NOTE — Telephone Encounter (Signed)
Call to patient. Advised need to reschedule today's appointment and offered wed at 330. Patient is driving and is unable to check her calendar. She will call me back to reschedule.

## 2015-05-12 NOTE — Telephone Encounter (Signed)
Called and left message to call back and reschedule the cancelled appointment.

## 2015-05-12 NOTE — Telephone Encounter (Signed)
Ashley Mosley, can you please call her back and assist with rescheduling.

## 2015-05-21 NOTE — Telephone Encounter (Signed)
Called and left a message for patient to call back and reschedule her appointment.

## 2015-05-31 NOTE — Telephone Encounter (Signed)
Call back to patient. left message calling to assist with rescheduling the appointment we previously had to cancel.Can call back and ask for Ashley Mosley or Ashley Mosley and we would assist in rescheduling her.

## 2015-06-08 NOTE — Telephone Encounter (Signed)
Called and left a message for patient to call back and ask for Ashley Mosley or Ashley Mosley for assistance rescheduling.   Routing to provider for review. Patient has been called multiple times and has not returned our calls. Okay to close encounter or if further follow up needed?

## 2015-06-08 NOTE — Telephone Encounter (Signed)
Yes, ok to close encounter.  Pt called for appt due to menopausal symptoms.

## 2015-07-29 ENCOUNTER — Telehealth: Payer: Self-pay | Admitting: Cardiovascular Disease

## 2015-07-29 NOTE — Telephone Encounter (Signed)
Received records from [facility] for appointment on 08/31/15 with Dr Oval Linsey.  Records given to [facility] (medical records) for Dr Blenda Mounts schedule on 08/31/15. lp

## 2015-08-31 ENCOUNTER — Ambulatory Visit: Payer: 59 | Admitting: Cardiovascular Disease

## 2015-09-02 ENCOUNTER — Encounter: Payer: Self-pay | Admitting: *Deleted

## 2015-09-02 NOTE — Progress Notes (Signed)
Letter mailed

## 2015-10-17 IMAGING — MG MM DIGITAL DIAGNOSTIC BILAT
8 of 10 series · 8 of 10 positions shown · non-contrast
Comparison: 05/29/2012 AND 05/23/2012 outside mammogram

ADDENDUM:
DIGITAL BREAST TOMOSYNTHESIS

Digital breast tomosynthesis images are acquired in two projections.
These images are reviewed in combination with the digital mammogram,
confirming the findings below.
CLINICAL DATA: 52-year-old female with palpable lump in the upper
outer left breast discovered on clinical examination. Also for
follow-up of right breast calcifications from outside institution.
EXAM:
DIGITAL DIAGNOSTIC  BILATERAL MAMMOGRAM WITH CAD
ULTRASOUND LEFT BREAST

[R CC (1 of 2)]
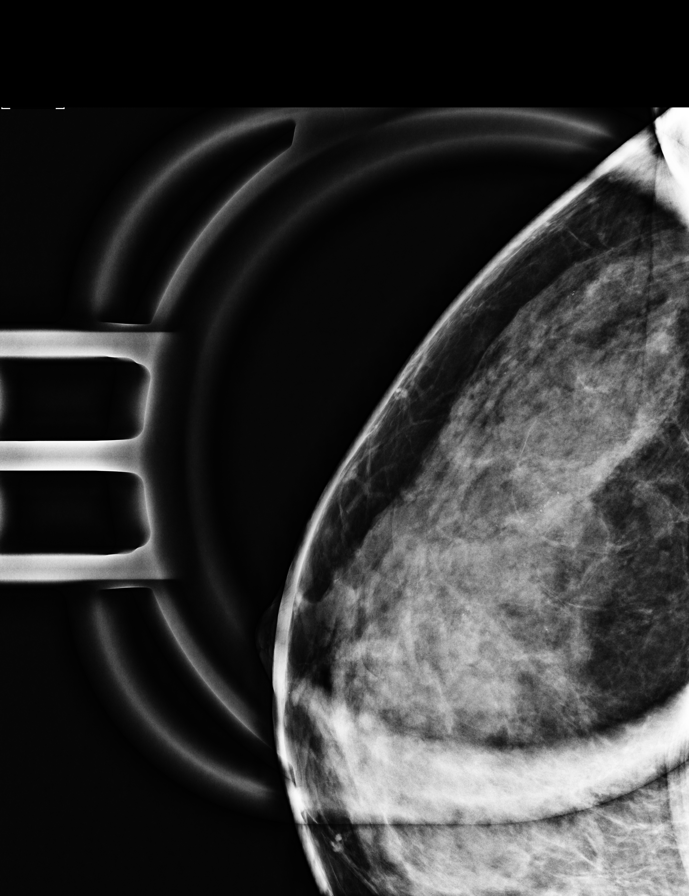

[L CC]
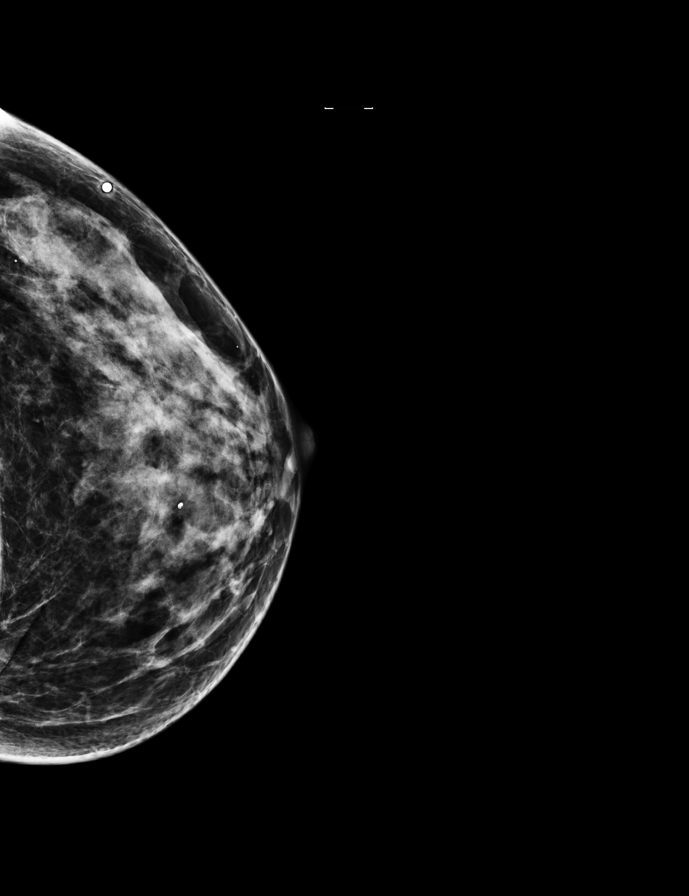

[L ML]
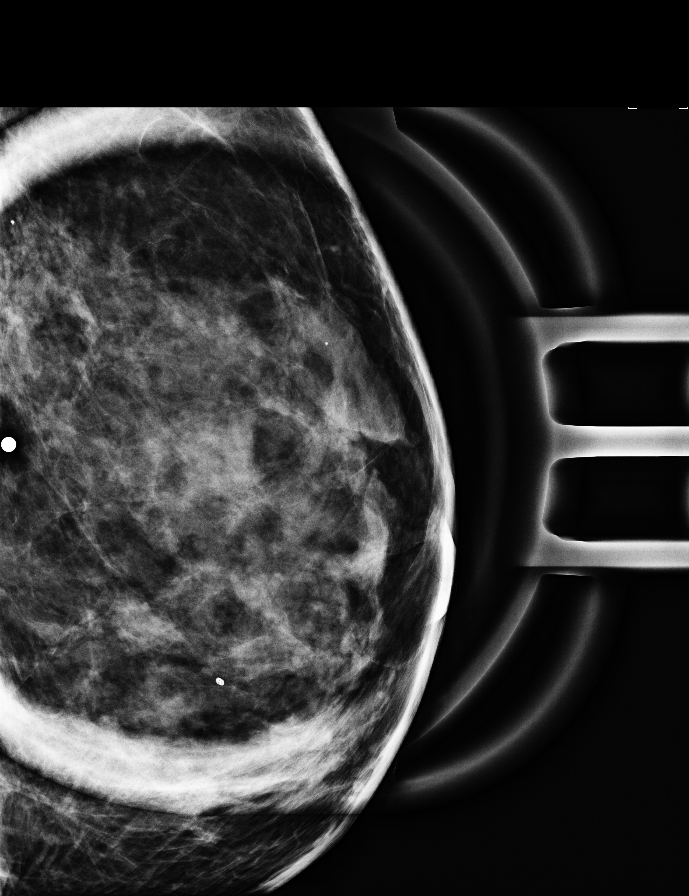

[L MLO]
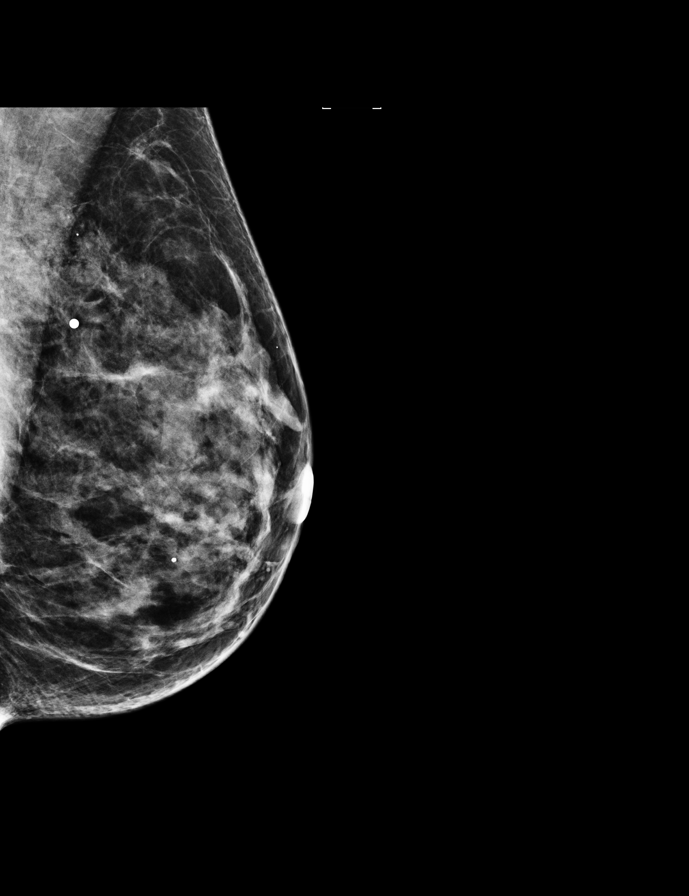

[R ML (1 of 2)]
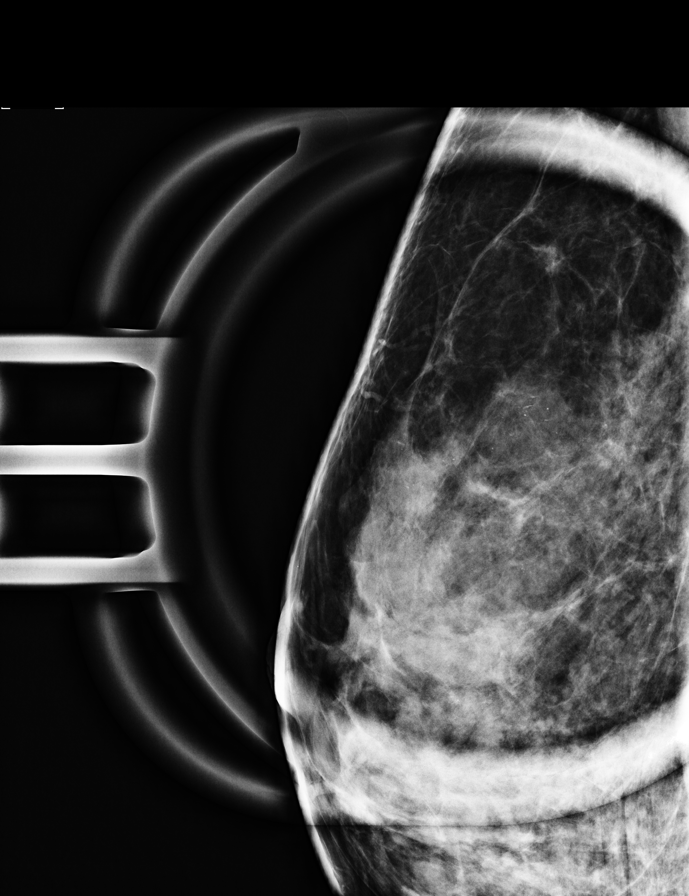

[R ML (2 of 2)]
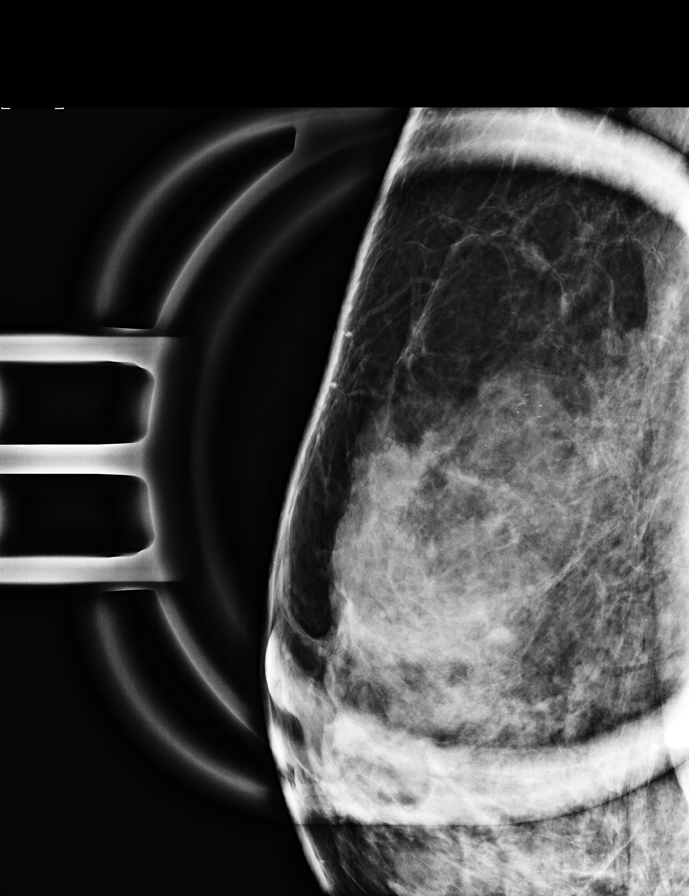

[R CC (2 of 2)]
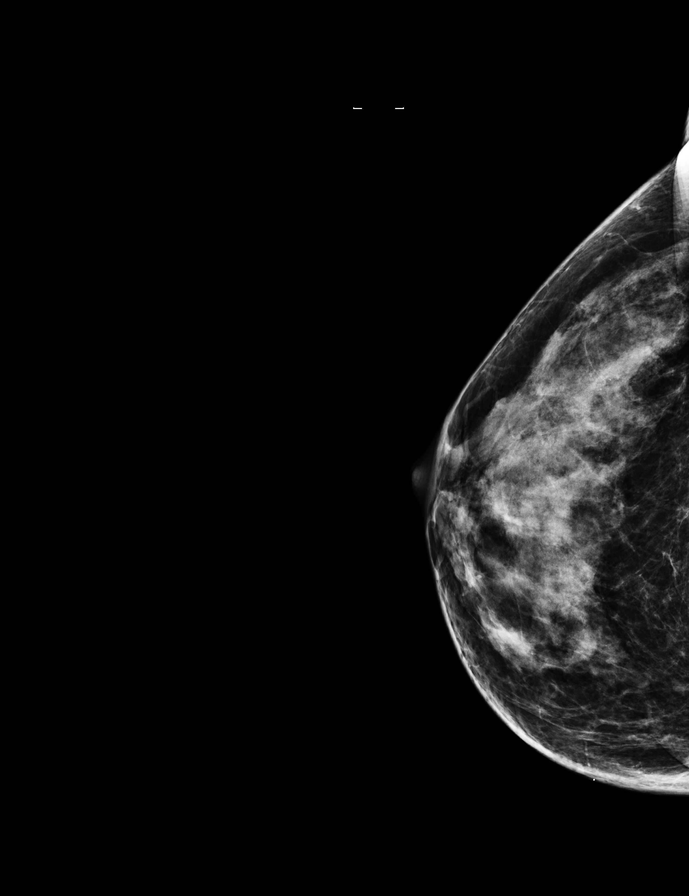

[R MLO]
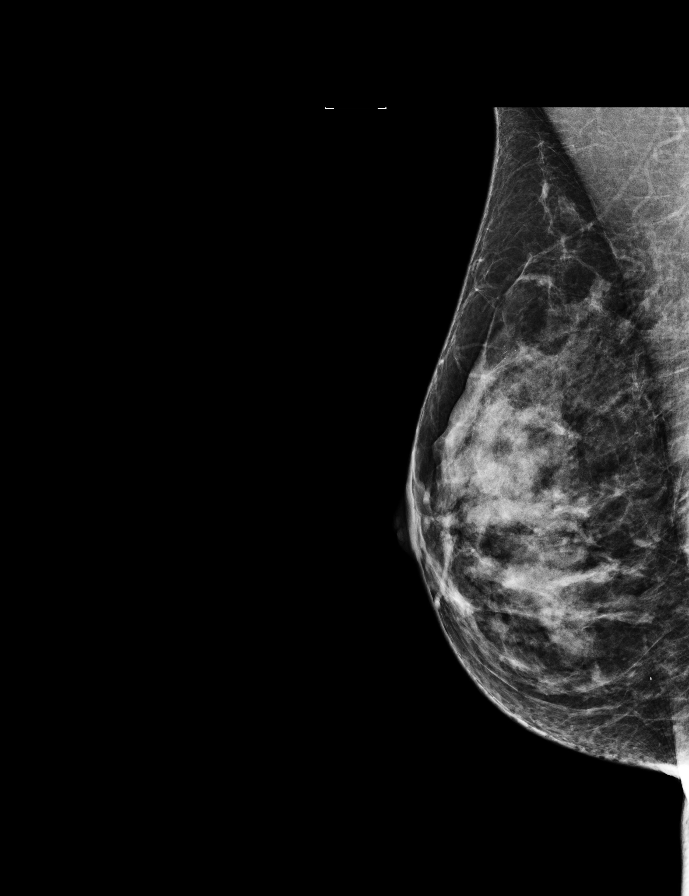

[8 of 10 positions shown; findings below may reference images not displayed]

ACR Breast Density Category c: The breasts are heterogeneously
dense, which may obscure small masses.
FINDINGS: Routine and magnification views of both breast are performed.

Scattered calcifications within the right breast are noted, many
which layer and compatible with milk of calcium.

A 3 x 5 mm group of calcifications within the upper inner left
breast are identified, more on the ML view than on the CC view.

Mammographic images were processed with CAD.

On physical exam,no palpable abnormalities are identified within the
upper left breast.

Ultrasound is performed, showing a few small scattered benign cysts
within the upper inner left breast. One of the cysts is associated
with punctate calcifications..

There is no evidence of solid mass, distortion or abnormal areas of
shadowing within the entire upper left breast.
IMPRESSION: Likely benign left breast calcifications, and benign and likely
benign right breast calcifications. Six-month followup is
recommended to ensure stability.

No mammographic, palpable or sonographic abnormality within the
upper outer left breast, in the area of palpable concern.

RECOMMENDATION:
Bilateral diagnostic mammograms with magnification views in 6
months.

I have discussed the findings and recommendations with the patient.
Results were also provided in writing at the conclusion of the
visit.

BI-RADS CATEGORY  3: Probably benign finding(s) - short interval
follow-up suggested.

## 2016-01-10 DIAGNOSIS — F4310 Post-traumatic stress disorder, unspecified: Secondary | ICD-10-CM

## 2016-01-10 HISTORY — DX: Post-traumatic stress disorder, unspecified: F43.10

## 2016-03-13 ENCOUNTER — Encounter: Payer: Self-pay | Admitting: Obstetrics and Gynecology

## 2016-03-13 ENCOUNTER — Ambulatory Visit (INDEPENDENT_AMBULATORY_CARE_PROVIDER_SITE_OTHER): Payer: 59 | Admitting: Obstetrics and Gynecology

## 2016-03-13 VITALS — BP 108/60 | HR 80 | Resp 18 | Ht 64.0 in | Wt 122.2 lb

## 2016-03-13 DIAGNOSIS — Z01419 Encounter for gynecological examination (general) (routine) without abnormal findings: Secondary | ICD-10-CM

## 2016-03-13 DIAGNOSIS — N6320 Unspecified lump in the left breast, unspecified quadrant: Secondary | ICD-10-CM | POA: Diagnosis not present

## 2016-03-13 DIAGNOSIS — N889 Noninflammatory disorder of cervix uteri, unspecified: Secondary | ICD-10-CM

## 2016-03-13 NOTE — Progress Notes (Signed)
56 y.o. [id number] Married Caucasian female here for annual exam.    New dx of diabetes.  Now on Metformin.  Had stress test which was normal per patient.   Bilateral breast tenderness.   Dryness with intercourse.  Decreased libido.   PCP:  Corine Shelter, PA-C    Patient's last menstrual period was 11/10/2014 (approximate).           Sexually active: Yes.   female The current method of family planning is tubal ligation--Essure.    Exercising: Yes.    walking Smoker:  yes  Health Maintenance: Pap:01-18-15 Neg:Neg HR HPV;11-21-13 Neg:Neg HR HPV History of abnormal Pap: Yes, Abnormal pap x3 from 2007-2011 with colposcopy.   Colposcopy with biopsy on 02/11/15 - LGSIL, ECC benign. MMG:  01-13-15 3D Density C/Neg/BiRads1:TBC  Colonoscopy:  12-04-12 polyps with Dr.Mann;2016 - polyp. BMD:   n/a  Result  n/a TDaP:  2007 Gardasil:   N/A HIV:  2007 - negative.  Hep C:  Thinks she was tested in 2007 also when she had STD screening. Screening Labs:  Hb today: PCP does all labs   reports that she has been smoking Cigarettes.  She started smoking about 37 years ago. She has a 17.50 pack-year smoking history. She has never used smokeless tobacco. She reports that she does not drink alcohol or use drugs.  Past Medical History:  Diagnosis Date  . 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  Marland Kitchen Abnormal uterine bleeding   . Anemia   . Anxiety   . Blood transfusion without reported diagnosis    age 19  . Depression   . Dysmenorrhea   . Elevated hemoglobin A1c 2014  . Endometriosis   . Fibroid 2012, 2015, 2017      . Gastric ulcer 2014  . Nervous breakdown 07/2013   [facility] in [place]  . [place] spotted fever 1970   hospitalized x 1 month, had a blood transfusion  . Thrombocytosis ([place]) 2014    Past Surgical History:  Procedure Laterality Date  . CESAREAN SECTION  1993  . COLPOSCOPY  2010  . ESSURE TUBAL LIGATION  2010  . HYSTEROSCOPY W/D&C N/A  12/17/2012   Procedure: DILATATION AND CURETTAGE /HYSTEROSCOPY AND RESECTOSCOPE;  Surgeon: Jamey Reas de Berton Lan, MD;  Location: [facility];  Service: Gynecology;  Laterality: N/A;  . PELVIC LAPAROSCOPY    . TUBAL LIGATION     Essure    Current Outpatient Prescriptions  Medication Sig Dispense Refill  . hydrOXYzine (ATARAX/VISTARIL) 10 MG tablet Take 10 mg by mouth as needed.     . metFORMIN (GLUCOPHAGE) 500 MG tablet Take 500 mg by mouth at bedtime.    . Multiple Vitamins-Minerals (MULTIVITAL) CHEW Chew by mouth every morning.    . Terbinafine-Hydryprop Chitosan 250 MG & 1% KIT by Combination route.     No current facility-administered medications for this visit.     Family History  Problem Relation Age of Onset  . Diabetes Father     ROS:  Pertinent items are noted in HPI.  Otherwise, a comprehensive ROS was negative.  Exam:   BP 108/60 (BP Location: Left Arm, Patient Position: Sitting, Cuff Size: Normal)   Pulse 80   Resp 18   Ht _0  (1.626 m)   Wt 122 lb 3.2 oz (55.4 kg)   LMP 11/10/2014 (Approximate)   BMI 20.98 kg/m     General appearance: alert, cooperative and appears stated  age Head: Normocephalic, without obvious abnormality, atraumatic Neck: no adenopathy, supple, symmetrical, trachea midline and thyroid normal to inspection and palpation Lungs: clear to auscultation bilaterally Breasts: Right breast:  normal appearance, no masses or tenderness, No nipple retraction or dimpling, No nipple discharge or bleeding, No axillary or supraclavicular adenopathy.  Left breast with firm 4 - 5 mm lump at 2:00 at border of areola, nontender.  No retractions, nipple discharge or axillary adenopathy. Heart: regular rate and rhythm Abdomen: soft, non-tender; no masses, no organomegaly Extremities: extremities normal, atraumatic, no cyanosis or edema Skin: Skin color, texture, turgor normal. No rashes or lesions Lymph nodes: Cervical, supraclavicular, and axillary  nodes normal. No abnormal inguinal nodes palpated Neurologic: Grossly normal  Pelvic: External genitalia:  no lesions              Urethra:  normal appearing urethra with no masses, tenderness or lesions              Bartholins and Skenes: normal                 Vagina: normal appearing vagina with normal color and discharge, no lesions              Cervix:  Raised white lesion of cervix.               Pap taken: Yes.   Bimanual Exam:  Uterus:  normal size, contour, position, consistency, mobility, non-tender              Adnexa: no mass, fullness, tenderness              Rectal exam: Yes.  .  Confirms.              Anus:  normal sphincter tone, no lesions  Chaperone was present for exam.  Assessment:   Well woman visit with normal exam. Hx Essure.  Cervical lesion. Hx LGSIL. 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 Prior abnormal paps. New dx DM.  Left breast lump at 2:00  Plan: Mammogram - bilateral diagnostic and left breast ultrasound. Recommended self breast awareness. Pap and HR HPV as above. Will schedule colposcopy.  We did discuss the possibility of LEEP for persistent abnormal paps. Guidelines for Calcium, Vitamin D, regular exercise program including cardiovascular and weight bearing exercise.  Follow up annually and prn.   After visit summary provided.

## 2016-03-13 NOTE — Patient Instructions (Signed)

## 2016-03-13 NOTE — Progress Notes (Signed)
Spoke with Ashley Mosley at [facility]. Patient scheduled while in office for bilateral diagnostic MMG and left breast ultrasound on 03/17/16 arriving at 2:40pm for 3pm appointment. Patient verbalizes understanding and is agreeable to date and time.

## 2016-03-15 ENCOUNTER — Encounter: Payer: Self-pay | Admitting: Obstetrics and Gynecology

## 2016-03-16 LAB — IPS PAP TEST WITH HPV

## 2016-03-17 ENCOUNTER — Ambulatory Visit
Admission: RE | Admit: 2016-03-17 | Discharge: 2016-03-17 | Disposition: A | Discharge status: Home / Self Care | Payer: 59 | Source: Ambulatory Visit | Attending: Obstetrics and Gynecology | Admitting: Obstetrics and Gynecology

## 2016-03-17 DIAGNOSIS — N6320 Unspecified lump in the left breast, unspecified quadrant: Secondary | ICD-10-CM

## 2016-03-20 ENCOUNTER — Other Ambulatory Visit: Payer: Self-pay | Admitting: Obstetrics and Gynecology

## 2016-03-20 DIAGNOSIS — N939 Abnormal uterine and vaginal bleeding, unspecified: Secondary | ICD-10-CM

## 2016-03-21 ENCOUNTER — Telehealth: Payer: Self-pay | Admitting: *Deleted

## 2016-03-21 NOTE — Telephone Encounter (Signed)
Left message to call Rhonin Trott at [phone].  

## 2016-03-21 NOTE — Telephone Encounter (Signed)
-----   Message from Nunzio Cobbs, MD sent at 03/20/2016 10:45 AM EDT ----- Patient will need [place] and estradiol done at her colposcopy visit.  I will place future orders.

## 2016-03-27 NOTE — Telephone Encounter (Signed)
Spoke with patient, advised as seen below per Dr. Quincy Simmonds. Patient states she experienced bleeding 3/11-3/18 that looked like "kool aid" to start with and ended as dark brown and "smelly". Patient reports she noticed an odor on 3/16 and 3/17. Patient denies nausea, vomiting, fever. Patient reports an occasional "twinge" and "pressure" in vagina, but not pain. Patient reports LMP Nov 2016. Patient asking if this bleeding  is related to pap being done or something else? Advised patient would review with Dr. Quincy Simmonds and return call with recommendations. Patient advised to leave detailed message on voice mail if no answer.   Dr. Quincy Simmonds -please advise, scheduled for colpo 3/23.

## 2016-03-27 NOTE — Telephone Encounter (Signed)
Spoke with patient, advised as seen below per Dr. Silva. Patient verbalizes understanding and is agreeable.   Routing to provider for final review. Patient is agreeable to disposition. Will close encounter.  

## 2016-03-27 NOTE — Telephone Encounter (Signed)
Patient returning your call.

## 2016-03-27 NOTE — Telephone Encounter (Signed)
The pap should not have caused this bleeding.  We will retest her hormones when she returns to have her colposcopy done so we can understand if the bleeding was due to a menstrual period or not.  Please keep colposcopy appointment.

## 2016-03-30 NOTE — Progress Notes (Signed)
Subjective:     Patient ID: Ashley Mosley, female   DOB: 18-Jan-1960, 56 y.o.   MRN: [id number]  HPI  Patient here today for colposcopy due to a raised white lesion on the cervix at annual exam.  Post coital bleeding this last weekend.  This occurred the day after she had sex. Bleeding lasted for a week and she had breast tenderness.  It felt like a period to her.  Prior menses 11/10/14.  Smoker.   Pap history: 03-13-16 Neg:Neg HR HPV Abnormal pap x3 from 2007-2011 with colposcopy.   Colposcopy with biopsy on 02/11/15 - LGSIL, ECC benign.  Has a left breast cyst and will do a follow up in September 2018.   Review of Systems  LMP: 11/10/14 Contraception:Tubal--Essure    Objective:   Physical Exam  Genitourinary:        Colposcopy procedure.  Consent for procedure.  Speculum placed.  3% acetic acid placed.  Flat white change noted at 12:00 and 7:00.  ECC taken and sent to path. Biopsy of 7:00 taken and sent to path. Biopsy of 12:00 taken and sent to path. Monsel's placed.  Minimal EBL and no complications.   After completing the colposcopy, patient asked for evaluation of rough areas she has developed on her vulva.  Acetic acid soaked gauze to vulva.  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  Two 8 mm areas noted along the right labia majora.   Superior lesion is grey in color and inferior lesion is more white in color.  An additional 3 mm condyloma noted of right labia minora.  Flat white lesions of HPV noted between the right labia minora and majora. Right labia majora condylomatous lesions were prepped with betadine.  Areas infiltrated with 1% lidocaine, lot [id number], exp 02/21. 3 mm punch biopsy of each to pathology separately.  AgNO3 placed.  Then simple suture of 3/0 vicryl in each for good hemostasis.  Minimal bleeding.  No complications.     Assessment:     Postmenopausal bleeding.  Hx fibroids. Smoker.  Cervical lesion and vulvar lesions.   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  Hx LGSIL.    Plan:     We discussed postmenopausal bleeding. FSH and estradiol checked today.  EMB if menopause confirmed. Discussion of condyloma and HPV. Follow up biopsy results. Smoking cessation reviewed.   ____15___ minutes face to face time of which over 50% was spent in counseling.   After visit summary to patient.

## 2016-03-31 ENCOUNTER — Ambulatory Visit (INDEPENDENT_AMBULATORY_CARE_PROVIDER_SITE_OTHER): Payer: 59 | Admitting: Obstetrics and Gynecology

## 2016-03-31 VITALS — BP 140/86 | HR 88 | Resp 16 | Ht 64.0 in | Wt 121.0 lb

## 2016-03-31 DIAGNOSIS — N889 Noninflammatory disorder of cervix uteri, unspecified: Secondary | ICD-10-CM

## 2016-03-31 DIAGNOSIS — N9089 Other specified noninflammatory disorders of vulva and perineum: Secondary | ICD-10-CM

## 2016-03-31 DIAGNOSIS — N939 Abnormal uterine and vaginal bleeding, unspecified: Secondary | ICD-10-CM

## 2016-03-31 NOTE — Patient Instructions (Signed)
VULVAR BIOPSY POST-PROCEDURE INSTRUCTIONS  1. You may take Ibuprofen, Aleve or Tylenol for pain if needed.    2. You may have a small amount of spotting.  You should wear a mini pad for the next few days.  3. You may use some topical Neosporin ointment if you would like (over the counter is fine).  4. You need to call if you have redness around the biopsy site, if there is any unusual draining, if the bleeding is heavy, or if you are concerned.  5. Shower or bathe as normal  6. We will call you within one week with results or we will discuss the results at your follow-up appointment if needed.   Colposcopy, Care After This sheet gives you information about how to care for yourself after your procedure. Your health care provider may also give you more specific instructions. If you have problems or questions, contact your health care provider. What can I expect after the procedure? If you had a colposcopy without a biopsy, you can expect to feel fine right away, but you may have some spotting for a few days. You can go back to your normal activities. If you had a colposcopy with a biopsy, it is common to have:  Soreness and pain. This may last for a few days.  Light-headedness.  Mild vaginal bleeding or dark-colored, grainy discharge. This may last for a few days. The discharge may be due to a solution that was used during the procedure. You may need to wear a sanitary pad during this time.  Spotting for at least 48 hours after the procedure. Follow these instructions at home:  Take over-the-counter and prescription medicines only as told by your health care provider. Talk with your health care provider about what type of over-the-counter pain medicine and prescription medicine you can start taking again. It is especially important to talk with your health care provider if you take blood-thinning medicine.  Do not drive or use heavy machinery while taking prescription pain  medicine.  For at least 3 days after your procedure, or as long as told by your health care provider, avoid:  Douching.  Using tampons.  Having sexual intercourse.  Continue to use birth control (contraception).  Limit your physical activity for the first day after the procedure as told by your health care provider. Ask your health care provider what activities are safe for you.  It is up to you to get the results of your procedure. Ask your health care provider, or the department performing the procedure, when your results will be ready.  Keep all follow-up visits as told by your health care provider. This is important. Contact a health care provider if:  You develop a skin rash. Get help right away if:  You are bleeding heavily from your vagina or you are passing blood clots. This includes using more than one sanitary pad per hour for 2 hours in a row.  You have a fever or chills.  You have pelvic pain.  You have abnormal, yellow-colored, or bad-smelling vaginal discharge. This could be a sign of infection.  You have severe pain or cramps in your lower abdomen that do not get better with medicine.  You feel light-headed or dizzy, or you faint. Summary  If you had a colposcopy without a biopsy, you can expect to feel fine right away, but you may have some spotting for a few days. You can go back to your normal activities.  If you had  a colposcopy with a biopsy, you may notice mild pain and spotting for 48 hours after the procedure.  Avoid douching, using tampons, and having sexual intercourse for 3 days after the procedure or as long as told by your health care provider.  Contact your health care provider if you have bleeding, severe pain, or signs of infection. This information is not intended to replace advice given to you by your health care provider. Make sure you discuss any questions you have with your health care provider. Document Released: 10/16/2012 Document  Revised: 08/13/2015 Document Reviewed: 08/13/2015 Elsevier Interactive Patient Education  2017 [employer].

## 2016-04-01 ENCOUNTER — Encounter: Payer: Self-pay | Admitting: Obstetrics and Gynecology

## 2016-04-01 LAB — ESTRADIOL: ESTRADIOL: 28 pg/mL

## 2016-04-01 LAB — FOLLICLE STIMULATING HORMONE: FSH: 81 m[IU]/mL

## 2016-04-03 ENCOUNTER — Other Ambulatory Visit: Payer: Self-pay | Admitting: *Deleted

## 2016-04-03 DIAGNOSIS — N950 Postmenopausal bleeding: Secondary | ICD-10-CM

## 2016-04-06 ENCOUNTER — Telehealth: Payer: Self-pay | Admitting: Obstetrics and Gynecology

## 2016-04-06 NOTE — Telephone Encounter (Signed)
See result note sent through to Triage.  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  I can do cryotherapy of the condyloma when I see her for the endometrial biopsy.  Please enter 08 recall for her pap follow up.  No treatment of the cervix is needed.

## 2016-04-06 NOTE — Telephone Encounter (Signed)
Patient called to see if her results were back from her visit on 03/31/16 for colposcopy and labs.

## 2016-04-06 NOTE — Progress Notes (Signed)
GYNECOLOGY  VISIT   HPI: 56 y.o.   Married  Caucasian  female   [phone] with Patient's last menstrual period was 01/29/2015 (exact date).   here for endometrial biopsy for postmenopausal bleeding and treatment of vulvar condyloma.  Results from colposcopy revealed atypia on cervical biopsies and condyloma on vulvar biopsies.   Bled for a week following her last pap on 03/13/16.  Had some discomfort at the same time. E2 28 and FSH 81 on 03/31/16.  GYNECOLOGIC HISTORY: Patient's last menstrual period was 01/29/2015 (exact date). Contraception:  Tubal/Essure Menopausal hormone therapy:  none Last mammogram:03-17-16 Diag.Bil.& Lt.Br.US--Density C/70m mass Lt.Br. 2 o'clock probably benign/6 mo.Diag.Lt.Br.MMG & UKorearec./BiRads3:TBC   Last pap smear: 03-13-16 Neg:Neg HR HPV;01-18-15 Neg:Neg HR HPV(Colposcopy 02-11-15 - LGSIL, ECC neg)        OB History    Gravida Para Term Preterm AB Living   '5 3 2 1 2 3   ' SAB TAB Ectopic Multiple Live Births     2     2         Patient Active Problem List   Diagnosis Date Noted  . Elevated hemoglobin A1c 12/11/2012  . Thrombocytosis ([place] 12/11/2012  . Uterine fibroid 11/07/2012  . Endometrial mass 11/07/2012  . Anxiety state, unspecified 07/22/2012  . [place]oid [place] 07/22/2012  . Major depressive disorder, single episode, severe, specified as with psychotic behavior 07/22/2012  . Abnormal finding on mammography, microcalcification 06/17/2012  . Acid reflux 02/05/2008  . B-complex deficiency 09/07/2006    Past Medical History:  Diagnosis Date  . 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  .Marland KitchenAbnormal uterine bleeding   . Anemia   . Anxiety   . Blood transfusion without reported diagnosis    age 56 . Depression   . Dysmenorrhea   . Elevated hemoglobin A1c 2014  . Endometriosis   . Fibroid 2012, 2015, 2017      . Gastric ulcer 2014  . [facility] [place]   hospitalized x 1 month, had a blood transfusion  . Thrombocytosis ([place] 2014    Past Surgical History:  Procedure Laterality Date  . CESAREAN SECTION  1993  . COLPOSCOPY  2010  . ESSURE TUBAL LIGATION  2010  . HYSTEROSCOPY W/D&C N/A 12/17/2012   Procedure: DILATATION AND CURETTAGE /HYSTEROSCOPY AND RESECTOSCOPE;  Surgeon: BJamey Reasde CBerton Lan MD;  [facility];  Service: Gynecology;  Laterality: N/A;  . PELVIC LAPAROSCOPY    . TUBAL LIGATION     Essure    Current Outpatient Prescriptions  Medication Sig Dispense Refill  . hydrOXYzine (ATARAX/VISTARIL) 10 MG tablet Take 10 mg by mouth as needed.     . metFORMIN (GLUCOPHAGE) 500 MG tablet Take 500 mg by mouth at bedtime.    . Multiple Vitamins-Minerals (MULTIVITAL) CHEW Chew by mouth every morning.    . Terbinafine-Hydryprop Chitosan 250 MG & 1% KIT by Combination route.     No current facility-administered medications for this visit.      ALLERGIES: Codeine  Family History  Problem Relation Age of Onset  . Diabetes Father     Social History   Social History  . Marital status: Married    Spouse name: N/A  . Number of children: N/A  . Years of education: N/A   Occupational History  . Not on file.   Social History  Main Topics  . Smoking status: Current Every Day Smoker    Packs/day: 0.50    Years: 35.00    Types: Cigarettes    Start date: 12/09/1978  . Smokeless tobacco: Never Used  . Alcohol use No  . Drug use: No  . Sexual activity: Yes    Partners: Male    Birth control/ protection: Other-see comments     Comment: Essure   Other Topics Concern  . Not on file   Social History Narrative  . No narrative on file    ROS:  Pertinent items are noted in HPI.  PHYSICAL EXAMINATION:    BP 102/60 (BP Location: Right Arm, Patient Position: Sitting, Cuff Size: Normal)   Pulse 84   Resp 14   Wt 118 lb (53.5 kg)   LMP 01/29/2015 (Exact Date)   BMI 20.25 kg/m      General appearance: alert, cooperative and appears stated age   EMB and TCA to vulvar condyloma Consent for procedures. Speculum placed.  Sterile prep with Hibiclens. Paracervical block - 10 cc 1% lidocaine - lot number [id number], exp 02/21. Tenaculum to anterior cervical lip. Pipelle passed to 7 cm x 2.  Tissue to pathology.  Minimal EBL.  No complications.   Treatment of right labia majora condyloma.  2 condyloma 8 mm each with a suture present and one 3 mm condyloma noted. Sutures removed.  80% TCA - lot number [id number] , exp 01/31/17.  All condyloma treated.  White effect noted.  Patient tolerated procedure well.  No complications.   Chaperone was present for exam.  ASSESSMENT  Postmenopausal bleeding.  Vulvar condyloma.  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  PLAN  Follow up EMB.  EMB and vulvar condyloma treatment precautions given.  We discussed potential for Aldara cream for the condyloma.  Pap and HR HPV in one year. We reviewed smoking cessation and a positive effect on her cervix and vulvar health to reduce the effect of HPV.  Return in 2 weeks to reassess to condyloma.    An After Visit Summary was printed and given to the patient.  __15____ minutes face to face time of which over 50% was spent in counseling.

## 2016-04-06 NOTE — Telephone Encounter (Signed)
Surgical pathology results from colposcopy on 03/31/2016 to Dr.Silva for review.

## 2016-04-06 NOTE — Telephone Encounter (Signed)
Spoke with patient. Advised of results as seen below from [place]. Patient is agreeable and verbalizes understanding. Aex scheduled for 03/19/2017. 08 recall placed. Patient would like cryotherapy when she returns for EMB on 04/10/2016.  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 No condyloma, dysplasia, or malignancy of the cervix. I am not recommending any treatment. She will need cotesting in 12 months.  Please enter 08 recall.   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  We can plan to do cryotherapy of these areas when she returns for the endometrial biopsy.   Bent to provider for final review. Patient agreeable to disposition. Will close encounter.

## 2016-04-10 ENCOUNTER — Ambulatory Visit (INDEPENDENT_AMBULATORY_CARE_PROVIDER_SITE_OTHER): Payer: 59 | Admitting: Obstetrics and Gynecology

## 2016-04-10 ENCOUNTER — Encounter: Payer: Self-pay | Admitting: Obstetrics and Gynecology

## 2016-04-10 VITALS — BP 102/60 | HR 84 | Resp 14 | Wt 118.0 lb

## 2016-04-10 DIAGNOSIS — N950 Postmenopausal bleeding: Secondary | ICD-10-CM | POA: Diagnosis not present

## 2016-04-10 DIAGNOSIS — N879 Dysplasia of cervix uteri, unspecified: Secondary | ICD-10-CM | POA: Diagnosis not present

## 2016-04-10 DIAGNOSIS — A630 Anogenital (venereal) warts: Secondary | ICD-10-CM | POA: Diagnosis not present

## 2016-04-10 NOTE — Patient Instructions (Signed)
Endometrial Biopsy, Care After This sheet gives you information about how to care for yourself after your procedure. Your health care provider may also give you more specific instructions. If you have problems or questions, contact your health care provider. What can I expect after the procedure? After the procedure, it is common to have:  Mild cramping.  A small amount of vaginal bleeding for a few days. This is normal. Follow these instructions at home:  Take over-the-counter and prescription medicines only as told by your health care provider.  Do not douche, use tampons, or have sexual intercourse until your health care provider approves.  Return to your normal activities as told by your health care provider. Ask your health care provider what activities are safe for you.  Follow instructions from your health care provider about any activity restrictions, such as restrictions on strenuous exercise or heavy lifting. Contact a health care provider if:  You have heavy bleeding, or bleed for longer than 2 days after the procedure.  You have bad smelling discharge from your vagina.  You have a fever or chills.  You have a burning sensation when urinating or you have difficulty urinating.  You have severe pain in your lower abdomen. Get help right away if:  You have severe cramps in your stomach or back.  You pass large blood clots.  Your bleeding increases.  You become weak or light-headed, or you pass out. Summary  After the procedure, it is common to have mild cramping and a small amount of vaginal bleeding for a few days.  Do not douche, use tampons, or have sexual intercourse until your health care provider approves.  Return to your normal activities as told by your health care provider. Ask your health care provider what activities are safe for you. This information is not intended to replace advice given to you by your health care provider. Make sure you discuss any  questions you have with your health care provider. Document Released: 10/16/2012 Document Revised: 01/12/2016 Document Reviewed: 01/12/2016 Elsevier Interactive Patient Education  2017 Elsevier Inc.   Genital Warts Genital warts are small growths in the genital area or anal area. They are caused by a type of germ (HPV virus). This germ is spread from person to person during sex. It can be spread through vaginal, anal, and oral sex. Genital warts can lead to other problems if they are not treated. Follow these instructions at home: Medicines   Apply over-the-counter and prescription medicines only as told by your doctor.  Do not use medicines that are meant for treating hand warts.  Talk with your doctor about using anti-itch creams. General instructions   Do not touch or scratch the warts.  Do not have sex until your treatment is done.  Tell your current and past sexual partners about your condition. They may need treatment.  Keep all follow-up visits as told by your doctor. This is important.  After treatment, use condoms during sex. Other Instructions for Women   Women who have genital warts may need to be checked more often for cervical cancer.  If you become pregnant, tell your doctor that you have had genital warts. The germ can be passed to the baby. Contact a doctor if:  You have redness, swelling, or pain in the area of the treated skin.  You have a fever.  You feel generally sick.  You feel lumps in and around your genital area or anal area.  You have bleeding in your  genital area or anal area.  You have pain during sex. This information is not intended to replace advice given to you by your health care provider. Make sure you discuss any questions you have with your health care provider. Document Released: 03/22/2009 Document Revised: 06/03/2015 Document Reviewed: 03/23/2014 Elsevier Interactive Patient Education  2017 [employer].

## 2016-04-12 LAB — IPS OTHER TISSUE BIOPSY

## 2016-04-14 ENCOUNTER — Telehealth: Payer: Self-pay | Admitting: Obstetrics and Gynecology

## 2016-04-14 DIAGNOSIS — N950 Postmenopausal bleeding: Secondary | ICD-10-CM

## 2016-04-14 MED ORDER — MEDROXYPROGESTERONE ACETATE 10 MG PO TABS: ORAL_TABLET | ORAL | 2 refills | Status: DC

## 2016-04-14 NOTE — Telephone Encounter (Signed)
Patient wants to see if her results are in.

## 2016-04-14 NOTE — Telephone Encounter (Signed)
Dr. Quincy Simmonds, can you review EMB results dated 04/10/16 and advise?

## 2016-04-14 NOTE — Telephone Encounter (Signed)
Spoke with patient, advised of results and recommendations as seen below per Dr. Quincy Simmonds. Rx for provera to verified pharmacy on file. Patient has additional questions about thickness of endometrial lining and fibroids from last PUS in 2017, plans to discuss at 4/16 OV with Dr. Quincy Simmonds. Patient scheduled for EMB on 07/28/16 at 3pm with Dr. Quincy Simmonds, order placed. Patient verbalizes understanding and is agreeable.   Cc: Ashley Mosley  Routing to provider for final review. Patient is agreeable to disposition. Will close encounter.    Notes recorded by Nunzio Cobbs, MD on 04/14/2016 at 2:07 PM EDT Please report EMB showing weakly proliferative endometrium with tubal metaplasia.  (Estrogen effect and cells that are trying to reproduce fallopian tube cells.) There is no sign of atypical cells, hyperplasia, or malignancy.   I am recommending that she take cyclic progesterone for 3 months and than repeat the EMB.  Her Rx will be for Provera 10 mg daily for 10 days per month for 3 months. This will balance out her natural estrogen and help to reduce risk of future hyperplasia from developing.  Please schedule the EMB and place order.  Diagnosis is postmenopausal bleeding.

## 2016-04-14 NOTE — Telephone Encounter (Signed)
See result note sent through to Triage just now.

## 2016-04-24 ENCOUNTER — Ambulatory Visit (INDEPENDENT_AMBULATORY_CARE_PROVIDER_SITE_OTHER): Payer: 59 | Admitting: Obstetrics and Gynecology

## 2016-04-24 ENCOUNTER — Encounter: Payer: Self-pay | Admitting: Obstetrics and Gynecology

## 2016-04-24 VITALS — BP 128/70 | HR 88 | Resp 16 | Wt 116.0 lb

## 2016-04-24 DIAGNOSIS — Z720 Tobacco use: Secondary | ICD-10-CM | POA: Diagnosis not present

## 2016-04-24 DIAGNOSIS — N950 Postmenopausal bleeding: Secondary | ICD-10-CM

## 2016-04-24 DIAGNOSIS — A630 Anogenital (venereal) warts: Secondary | ICD-10-CM

## 2016-04-24 NOTE — Progress Notes (Signed)
GYNECOLOGY  VISIT   HPI: 55 y.o.   Married  Caucasian  female   [id number] with Patient's last menstrual period was 01/29/2015 (exact date).   here for follow up.   Had TCA treatment to condyloma on 04/10/16.   Episode of postmenopausal bleeding just after her annual exam. EMB on 04/10/16 showed weakly proliferative endometrium with tubal metaplasia.  Plan for Provera 10 mg daily for 10 days for 3 months and then re-biopsy.  Has not start Provera yet.   Smoker.  Difficulty quitting.   Will see a new behavioral health specialist to establish care.   GYNECOLOGIC HISTORY: Patient's last menstrual period was 01/29/2015 (exact date). Contraception: Tubal/Essure Menopausal hormone therapy:  Patient has not started Provera 10mg Last mammogram: 03-17-16 Diag.Bil.& Lt.Br.US--Density C/4mm mass Lt.Br. 2 o'clock probably benign/6 mo.Diag.Lt.Br.MMG & US rec./BiRads3:TBC   Last pap smear: 03-13-16 Neg:Neg HR HPV;01-18-15 Neg:Neg HR HPV(Colposcopy 02-11-15 - LGSIL, ECC neg)        OB History    Gravida Para Term Preterm AB Living   5 3 2 1 2 3   SAB TAB Ectopic Multiple Live Births     2     2         Patient Active Problem List   Diagnosis Date Noted  . Elevated hemoglobin A1c 12/11/2012  . Thrombocytosis (HCC) 12/11/2012  . Uterine fibroid 11/07/2012  . Endometrial mass 11/07/2012  . Anxiety state, unspecified 07/22/2012  . Paranoid schizophrenia (HCC) 07/22/2012  . Major depressive disorder, single episode, severe, specified as with psychotic behavior 07/22/2012  . Abnormal finding on mammography, microcalcification 06/17/2012  . Acid reflux 02/05/2008  . B-complex deficiency 09/07/2006    Past Medical History:  Diagnosis Date  . 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  . Abnormal uterine bleeding   . Anemia   . Anxiety   . Blood transfusion without reported diagnosis    age 7  . Depression   . Dysmenorrhea   . Elevated hemoglobin A1c 2014  . Endometriosis    . Fibroid 2012, 2015, 2017      . Gastric ulcer 2014  . Nervous breakdown 07/2013   [facility] in [place]  . [place] spotted fever 1970   hospitalized x 1 month, had a blood transfusion  . Thrombocytosis (HCC) 2014    Past Surgical History:  Procedure Laterality Date  . CESAREAN SECTION  1993  . COLPOSCOPY  2010  . ESSURE TUBAL LIGATION  2010  . HYSTEROSCOPY W/D&C N/A 12/17/2012   Procedure: DILATATION AND CURETTAGE /HYSTEROSCOPY AND RESECTOSCOPE;  Surgeon:  E Amundson de Carvalho E Silva, MD;  Location: [facility];  Service: Gynecology;  Laterality: N/A;  . PELVIC LAPAROSCOPY    . TUBAL LIGATION     Essure    Current Outpatient Prescriptions  Medication Sig Dispense Refill  . hydrOXYzine (ATARAX/VISTARIL) 10 MG tablet Take 10 mg by mouth as needed.     . metFORMIN (GLUCOPHAGE) 500 MG tablet Take 500 mg by mouth at bedtime.    . Multiple Vitamins-Minerals (MULTIVITAL) CHEW Chew by mouth every morning.    . Terbinafine-Hydryprop Chitosan 250 MG & 1% KIT by Combination route.    . medroxyPROGESTERone (PROVERA) 10 MG tablet Provera 10 mg daily for 10 days per month for 3 months. (Patient not taking: Reported on 04/24/2016) 10 tablet 2   No current facility-administered medications for this visit.      ALLERGIES: Codeine  Family History    Problem Relation Age of Onset  . Diabetes Father     Social History   Social History  . Marital status: Married    Spouse name: N/A  . Number of children: N/A  . Years of education: N/A   Occupational History  . Not on file.   Social History Main Topics  . Smoking status: Current Every Day Smoker    Packs/day: 0.50    Years: 35.00    Types: Cigarettes    Start date: 12/09/1978  . Smokeless tobacco: Never Used  . Alcohol use No  . Drug use: No  . Sexual activity: Yes    Partners: Male    Birth control/ protection: Other-see comments     Comment: Essure   Other Topics Concern  . Not on file   Social  History Narrative  . No narrative on file    ROS:  Pertinent items are noted in HPI.  PHYSICAL EXAMINATION:    BP 128/70 (BP Location: Right Arm, Patient Position: Sitting, Cuff Size: Normal)   Pulse 88   Resp 16   Wt 116 lb (52.6 kg)   LMP 01/29/2015 (Exact Date)   BMI 19.91 kg/m     Gen:  NAD.  Pelvic: External genitalia:  Right labia with healing condyloma sites.  Right superior site with small amount of condyloma remaining.  Tx with 80% acetic acid. White effect noted.               Urethra:  normal appearing urethra with no masses, tenderness or lesions             Chaperone was present for exam.  ASSESSMENT  Condyloma.   Tobacco use.  Postmenopausal bleeding.   PLAN  Condyloma treated.  Discussed smoking cessation.  [employer] handout given for free classes.  Discussed Chantix and Wellbutrin.  She will discuss Wellbutrin with her new behavioral health specialist.  Take Provera 10 mg x 10 days.  Discussed rationale.  Follow up for EMB in 3 months.    An After Visit Summary was printed and given to the patient.  __25____ minutes face to face time of which over 50% was spent in counseling.   

## 2016-07-25 ENCOUNTER — Telehealth: Payer: Self-pay | Admitting: Obstetrics and Gynecology

## 2016-07-25 NOTE — Telephone Encounter (Signed)
Patient cancelled procedure appointment for Friday and would like a call to reschedule.

## 2016-07-25 NOTE — Telephone Encounter (Signed)
Left message to call [place] at [phone].  Patient needs to reschedule EMB. Patient is postmenopausal.

## 2016-07-27 NOTE — Telephone Encounter (Signed)
Please reschedule the endometrial biopsy with me. We can see if she has had spontaneous resolution the the proliferative change of the endometrium. Tell her it is ok for now not to take the Provera. We can reassess after this next biopsy is done.

## 2016-07-27 NOTE — Telephone Encounter (Signed)
Spoke with patient. Patient declines to reschedule EMB at this time. Patient has not taken Provera as recommended by Dr.Silva on 04/24/2016. "I did not feel like I needed to take it I felt fine." Reports she has not had bleeding since right after her aex on 03/13/2016. Advised of EMB on 04/10/2016 showing weakly proliferative endometrium with tubal metaplasia and need to take Provera to clean out endometrial lining to reduce risk for endometrial hyperplasia. Advised repeat EMB is recommended to ensure her endometrial lining has not had any changes and that it is stable after taking Provera. Patient declines to take Provera. Advised will review with Dr.Silva and return call.

## 2016-07-28 ENCOUNTER — Ambulatory Visit: Payer: Self-pay | Admitting: Obstetrics and Gynecology

## 2016-07-28 NOTE — Telephone Encounter (Signed)
Left message to call Kaitlyn at [phone]. 

## 2016-07-28 NOTE — Telephone Encounter (Signed)
Spoke with patient. Advised of message as seen below from [place]. All questions answered. Patient is agreeable to scheduling. States she is in the car and does not have access to her calendar. Will return call to schedule when she is at home with her calendar available.

## 2016-07-31 NOTE — Telephone Encounter (Signed)
Patient returning your call.

## 2016-08-01 NOTE — Telephone Encounter (Signed)
Left message to call Kaitlyn at [phone]. 

## 2016-08-01 NOTE — Telephone Encounter (Signed)
Spoke with patient. Reviewed recommendations below from [place]. Appointment scheduled for patient to have EMB on 08/18/2016 at 3 pm with Dr.Silva. Patient is agreeable to date and time.  Routing to provider for final review. Patient agreeable to disposition. Will close encounter.

## 2016-08-15 ENCOUNTER — Telehealth: Payer: Self-pay | Admitting: Obstetrics and Gynecology

## 2016-08-15 NOTE — Telephone Encounter (Signed)
Patient cancelled her procedure appointment and will call back to reschedule. See staff message

## 2016-08-17 NOTE — Telephone Encounter (Signed)
Please contact patient in follow up and encourage rescheduling of her EMB.  She had postmenopausal bleeding and a biopsy showing some proliferative endometrium.  She elected not to take cyclic Provera.   She needs the follow up.   Separate staff message forwarded to you.  [place]

## 2016-08-18 ENCOUNTER — Ambulatory Visit: Payer: 59 | Admitting: Obstetrics and Gynecology

## 2016-08-24 NOTE — Telephone Encounter (Signed)
Please call patient to reschedule EMB procedure.

## 2016-08-24 NOTE — Telephone Encounter (Signed)
Left message to call Rebecka Oel[place] at [phone]. 

## 2016-09-06 NOTE — Telephone Encounter (Signed)
Left message to call Ashley Mosley at [phone]. 

## 2016-09-15 NOTE — Telephone Encounter (Signed)
Letter written and to [place] for review.

## 2016-09-15 NOTE — Telephone Encounter (Signed)
Yes, she needs a letter sent that advises about the risks of endometrial abnormalities including cancer.  I can review it before sending.  Thanks.

## 2016-09-15 NOTE — Telephone Encounter (Signed)
I have attempted to reach that patient x 2 with no return call. Okay to send letter?  Routing to Dr.Miller as Darreld Mclean is out of town.

## 2016-09-18 NOTE — Telephone Encounter (Signed)
Letter signed by Dr.Miller. Mailed certified to home address on file. Encounter closed.

## 2016-10-10 ENCOUNTER — Telehealth: Payer: Self-pay | Admitting: *Deleted

## 2016-10-10 DIAGNOSIS — N6320 Unspecified lump in the left breast, unspecified quadrant: Secondary | ICD-10-CM

## 2016-10-10 NOTE — Telephone Encounter (Signed)
Patient called and states that she is going to call and schedule -eh

## 2016-10-10 NOTE — Telephone Encounter (Signed)
Patient in 04 recall for 09/2016. Patient needs follow up breast imaging. Please contact patient regarding scheduling Thanks

## 2016-10-10 NOTE — Telephone Encounter (Signed)
Spoke with patient. States she is unclear about the breast imaging she needs to schedule. Advised 6 month f/u left breast diagnostic MMG and [place], order placed. Patient declined assistance with scheduling, will call [facility] directly for scheduling. Patient verbalizes understanding and is agreeable.  Routing to provider for final review. Patient is agreeable to disposition. Will close encounter.   Cc: Jeb Levering, LPN

## 2016-10-10 NOTE — Telephone Encounter (Signed)
Spoke with patient in regards to letter dated 09/15/16. States she has received notice of certified letter and a  letter in mail.  Patient request to schedule EMB, request to schedule on Friday afternoon. Declined 10/5 at 1:30, 2:30pm. Scheduled for 10/20/16 at 3pm with Dr. Quincy Simmonds. Patient verbalizes understanding and is agreeable.  Patient has additional question in regards to billing, call transferred to business office.  Routing to provider for final review. Patient is agreeable to disposition. Will close encounter.   Cc: Dr. Sabra Heck; A. Sheppard Coil

## 2016-10-19 ENCOUNTER — Ambulatory Visit
Admission: RE | Admit: 2016-10-19 | Discharge: 2016-10-19 | Disposition: A | Discharge status: Home / Self Care | Payer: 59 | Source: Ambulatory Visit | Attending: Obstetrics and Gynecology | Admitting: Obstetrics and Gynecology

## 2016-10-19 DIAGNOSIS — N6320 Unspecified lump in the left breast, unspecified quadrant: Secondary | ICD-10-CM

## 2016-10-20 ENCOUNTER — Ambulatory Visit (INDEPENDENT_AMBULATORY_CARE_PROVIDER_SITE_OTHER): Payer: 59 | Admitting: Obstetrics and Gynecology

## 2016-10-20 ENCOUNTER — Encounter: Payer: Self-pay | Admitting: Obstetrics and Gynecology

## 2016-10-20 DIAGNOSIS — Z8742 Personal history of other diseases of the female genital tract: Secondary | ICD-10-CM

## 2016-10-20 NOTE — Patient Instructions (Signed)

## 2016-10-20 NOTE — Progress Notes (Signed)
GYNECOLOGY  VISIT   HPI: 56 y.o.   Married  Caucasian  female   [phone] with Patient's last menstrual period was 01/29/2015 (exact date).   here for endometrial biopsy.    Had bleeding for a week after her last pap in March 2018.  EMB showed proliferative effect 04/2016.  Did not take provera.  No further bleeding or spotting.   Has been dx with posttraumatic stress disorder.  GYNECOLOGIC HISTORY: Patient's last menstrual period was 01/29/2015 (exact date). Contraception:  Tubal/Essure Menopausal hormone therapy:  None.  Last mammogram:  10-19-16 Diag.Lt.MMG & U/S/stable fibrocystic change @ 2 o'clock--Bil.Diag and Lt.Br.U/S 4Mo/BiRads3:TBC--03-17-16 Diag.Bil.& Lt.Br.US--Density C/31m mass Lt.Br. 2 o'clock probably benign/6 mo.Diag.Lt.Br.MMG & [place]orearec./BiRads3:TBC Last pap smear: 03-13-16 Neg:Neg HR HPV;01-18-15 Neg:Neg HR HPV(Colposcopy 02-11-15 - LGSIL, ECC neg)        OB History    Gravida Para Term Preterm AB Living   '5 3 2 1 2 3   ' SAB TAB Ectopic Multiple Live Births     2     2         Patient Active Problem List   Diagnosis Date Noted  . Elevated hemoglobin A1c 12/11/2012  . [place]rombocytosis ([place] 12/11/2012  . Uterine fibroid 11/07/2012  . Endometrial mass 11/07/2012  . Anxiety state, unspecified 07/22/2012  . Paranoid schizophrenia ([place] 07/22/2012  . Major depressive disorder, single episode, severe, specified as with psychotic behavior 07/22/2012  . Abnormal finding on mammography, microcalcification 06/17/2012  . Acid reflux 02/05/2008  . B-complex deficiency 09/07/2006    Past Medical History:  Diagnosis Date  . 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  .[place] KitchenAbnormal uterine bleeding   . Anemia   . Anxiety   . Blood transfusion without reported diagnosis    age 56 . Depression   . Dysmenorrhea   . Elevated hemoglobin A1c 2014  . Endometriosis   . Fibroid 2012, 2015, 2017      . Gastric ulcer 2014  . [facility] [place] [place] [facility] [place] (post-traumatic stress disorder[place] 2018  . [place]   hospitalized x 1 month, had a blood transfusion  . Thrombocytosis ([place] 2014    Past Surgical History:  Procedure Laterality Date  . CESAREAN SECTION  1993  . COLPOSCOPY  2010  . ESSURE TUBAL LIGATION  2010  . HYSTEROSCOPY W/D&C N/A 12/17/2012   Procedure: DILATATION AND CURETTAGE /HYSTEROSCOPY AND RESECTOSCOPE;  Surgeon: BJamey Reasde CBerton Lan MD;  [facility];  Service: Gynecology;  Laterality: N/A;  . PELVIC LAPAROSCOPY    . TUBAL LIGATION     Essure    Current Outpatient Prescriptions  Medication Sig Dispense Refill  . metFORMIN (GLUCOPHAGE) 500 MG tablet Take 500 mg by mouth at bedtime.    . Multiple Vitamins-Minerals (MULTIVITAL) CHEW Chew by mouth every morning.    . prazosin (MINIPRESS) 2 MG capsule Take 2 mg by mouth at bedtime as needed.    . Terbinafine-Hydryprop Chitosan 250 MG & 1% KIT by Combination route.    . medroxyPROGESTERone (PROVERA) 10 MG tablet Provera 10 mg daily for 10 days per month for 3 months. (Patient not taking: Reported on 04/24/2016) 10 tablet 2   No current facility-administered medications for this visit.      ALLERGIES: Codeine  Family History  Problem Relation Age of Onset  . Diabetes Father     Social History  Social History  . Marital status: Married    Spouse name: N/A  . Number of children: N/A  . Years of education: N/A   Occupational History  . Not on file.   Social History Main Topics  . Smoking status: Current Every Day Smoker    Packs/day: 0.50    Years: 35.00    Types: Cigarettes    Start date: 12/09/1978  . Smokeless tobacco: Never Used  . Alcohol use No  . Drug use: No  . Sexual activity: Yes    Partners: Male    Birth control/ protection: Other-see comments     Comment: Essure   Other Topics Concern  . Not on file   Social History Narrative  . No narrative on file    ROS:   Pertinent items are noted in HPI.  PHYSICAL EXAMINATION:    BP 138/76 (BP Location: Right Arm, Patient Position: Sitting, Cuff Size: Normal)   Pulse 76   Ht '5\' 4"'  (1.626 m)   Wt 118 lb (53.5 kg)   LMP 01/29/2015 (Exact Date)   BMI 20.25 kg/m     General appearance: alert, cooperative and appears stated age    Pelvic: External genitalia:  no lesions              Urethra:  normal appearing urethra with no masses, tenderness or lesions              Bartholins and Skenes: normal                 Vagina: normal appearing vagina with normal color and discharge, no lesions              Cervix: no lesions                Bimanual Exam:  Uterus:  normal size, contour, position, consistency, mobility, non-tender              Adnexa: no mass, fullness, tenderness         EMB -  Consent for procedure.  Speculum placed.  Sterile prep with betadine.  Paracervical block with 10 cc of 1% lidocaine - lot number [id number], exp 11/21. Tenaculum to anterior cervical lip.  Pipelle passed x 2 to 7 cm.  Scant tissue obtained.  Tissue to pathology.  No complications.  Minimal EBL.  Chaperone was present for exam.  ASSESSMENT  Postmenopausal bleeding with prior EMB showing proliferative endometrium.   PLAN  Discussion of postmenopausal bleeding.  Phases of endometrial histologic changes explained.  Follow up results.  I anticipated this may show atrophy.    An After Visit Summary was printed and given to the patient.  ____15__ minutes face to face time of which over 50% was spent in counseling.

## 2017-01-09 DIAGNOSIS — R918 Other nonspecific abnormal finding of lung field: Secondary | ICD-10-CM

## 2017-01-09 HISTORY — DX: Other nonspecific abnormal finding of lung field: R91.8

## 2017-02-14 ENCOUNTER — Other Ambulatory Visit: Payer: Self-pay | Admitting: Family

## 2017-02-14 DIAGNOSIS — D473 Essential (hemorrhagic) thrombocythemia: Secondary | ICD-10-CM

## 2017-02-14 DIAGNOSIS — D75839 Thrombocytosis, unspecified: Secondary | ICD-10-CM

## 2017-02-14 DIAGNOSIS — D500 Iron deficiency anemia secondary to blood loss (chronic): Secondary | ICD-10-CM

## 2017-02-15 ENCOUNTER — Other Ambulatory Visit: Payer: Self-pay

## 2017-02-15 ENCOUNTER — Inpatient Hospital Stay: Payer: Self-pay | Attending: Family | Admitting: Family

## 2017-02-15 ENCOUNTER — Inpatient Hospital Stay: Payer: Self-pay

## 2017-02-15 VITALS — BP 145/76 | HR 113 | Temp 98.3°F | Resp 16 | Wt 121.0 lb

## 2017-02-15 DIAGNOSIS — D75839 Thrombocytosis, unspecified: Secondary | ICD-10-CM

## 2017-02-15 DIAGNOSIS — Z7982 Long term (current) use of aspirin: Secondary | ICD-10-CM | POA: Insufficient documentation

## 2017-02-15 DIAGNOSIS — Z205 Contact with and (suspected) exposure to viral hepatitis: Secondary | ICD-10-CM

## 2017-02-15 DIAGNOSIS — Z7984 Long term (current) use of oral hypoglycemic drugs: Secondary | ICD-10-CM | POA: Insufficient documentation

## 2017-02-15 DIAGNOSIS — Z79899 Other long term (current) drug therapy: Secondary | ICD-10-CM | POA: Insufficient documentation

## 2017-02-15 DIAGNOSIS — E119 Type 2 diabetes mellitus without complications: Secondary | ICD-10-CM | POA: Insufficient documentation

## 2017-02-15 DIAGNOSIS — D473 Essential (hemorrhagic) thrombocythemia: Secondary | ICD-10-CM

## 2017-02-15 DIAGNOSIS — D500 Iron deficiency anemia secondary to blood loss (chronic): Secondary | ICD-10-CM

## 2017-02-15 DIAGNOSIS — D72829 Elevated white blood cell count, unspecified: Secondary | ICD-10-CM | POA: Insufficient documentation

## 2017-02-15 LAB — CMP (CANCER CENTER ONLY)
ALT: 16 U/L (ref 0–55)
ANION GAP: 7 (ref 5–15)
AST: 28 U/L (ref 5–34)
Albumin: 4.4 g/dL (ref 3.5–5.0)
Alkaline Phosphatase: 84 U/L (ref 26–84)
BUN: 13 mg/dL (ref 7–22)
CALCIUM: 9.5 mg/dL (ref 8.0–10.3)
CO2: 23 mmol/L (ref 18–33)
Chloride: 112 mmol/L — ABNORMAL HIGH (ref 98–108)
Creatinine: 0.8 mg/dL (ref 0.60–1.10)
GLUCOSE: 126 mg/dL — AB (ref 73–118)
POTASSIUM: 3.7 mmol/L (ref 3.3–4.7)
Sodium: 142 mmol/L (ref 128–145)
TOTAL PROTEIN: 7.4 g/dL (ref 6.4–8.1)
Total Bilirubin: 0.6 mg/dL (ref 0.2–1.2)

## 2017-02-15 LAB — CBC WITH DIFFERENTIAL (CANCER CENTER ONLY)
BASOS ABS: 0 10*3/uL (ref 0.0–0.1)
Basophils Relative: 0 %
EOS PCT: 2 %
Eosinophils Absolute: 0.3 10*3/uL (ref 0.0–0.5)
HEMATOCRIT: 41.5 % (ref 34.8–46.6)
Hemoglobin: 14.5 g/dL (ref 11.6–15.9)
LYMPHS PCT: 26 %
Lymphs Abs: 3.2 10*3/uL (ref 0.9–3.3)
MCH: 33.6 pg (ref 26.0–34.0)
MCHC: 34.9 g/dL (ref 32.0–36.0)
MCV: 96.3 fL (ref 81.0–101.0)
MONO ABS: 0.7 10*3/uL (ref 0.1–0.9)
Monocytes Relative: 6 %
NEUTROS ABS: 8.1 10*3/uL — AB (ref 1.5–6.5)
Neutrophils Relative %: 66 %
Platelet Count: 419 10*3/uL — ABNORMAL HIGH (ref 145–400)
RBC: 4.31 MIL/uL (ref 3.70–5.32)
RDW: 12.2 % (ref 11.1–15.7)
WBC Count: 12.3 10*3/uL — ABNORMAL HIGH (ref 3.9–10.0)

## 2017-02-15 LAB — SAVE SMEAR

## 2017-02-15 NOTE — Progress Notes (Signed)
Hematology and Oncology Follow Up Visit  Ashley Mosley [id number] 01/12/1960 57 y.o. 02/15/2017   Principle Diagnosis:  Transient Leukocytosis - chronic  Current Therapy:   Observation   Interim History:  Ashley Mosley is here today for follow-up. Her husband recently found out he has Hep C and she is quite worried about exposure.  Her platelet count is 419, WBC count is 12.3 and Hgb 14.5. LFT's are stable.  She is still smoking 1 ppd or less. She has some SOB with over exertion.  No fever, chills, n/v, cough, rash, dizziness, chest pain, palpitations, abdominal pain or changes in bowel or bladder habits.  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 No swelling or tenderness in her extremities. No c/o pain. She has a good appetite and is hydrating well. Her weight is stable.   She is diabetic and takes Metformin daily.   ECOG Performance Status: 1 - Symptomatic but completely ambulatory  Medications:  Allergies as of 02/15/2017      Reactions   Codeine Nausea Only      Medication List        Accurate as of 02/15/17  4:26 PM. Always use your most recent med list.          aspirin 81 MG chewable tablet Chew 81 mg by mouth daily. Takes once to three times a week.   metFORMIN 500 MG tablet Commonly known as:  GLUCOPHAGE Take 500 mg by mouth at bedtime.   MULTIVITAL Chew Chew by mouth every morning.   prazosin 2 MG capsule Commonly known as:  MINIPRESS Take 2 mg by mouth at bedtime.       Allergies:  Allergies  Allergen Reactions  . Codeine Nausea Only    Past Medical History, Surgical history, Social history, and Family History were reviewed and updated.  Review of Systems: All other 10 point review of systems is negative.   Physical Exam:  weight is 121 lb (54.9 kg). Her oral temperature is 98.3 F (36.8 C). Her blood pressure is 145/76 (abnormal) and her pulse is 113 (abnormal). Her respiration is 16 and oxygen  saturation is 98%.   Wt Readings from Last 3 Encounters:  02/15/17 121 lb (54.9 kg)  10/20/16 118 lb (53.5 kg)  04/24/16 116 lb (52.6 kg)    Ocular: Sclerae unicteric, pupils equal, round and reactive to light Ear-nose-throat: Oropharynx clear, dentition fair Lymphatic: No cervical, supraclavicular or axillary adenopathy Lungs no rales or rhonchi, good excursion bilaterally Heart regular rate and rhythm, no murmur appreciated Abd soft, nontender, positive bowel sounds, no liver or spleen tip palpated on exam, no fluid wave  MSK no focal spinal tenderness, no joint edema Neuro: non-focal, well-oriented, appropriate affect Breasts: Deferred   Lab Results  Component Value Date   WBC 12.3 (H) 02/15/2017   HGB 14.1 02/15/2015   HCT 41.5 02/15/2017   MCV 96.3 02/15/2017   PLT 419 (H) 02/15/2017   Lab Results  Component Value Date   FERRITIN 49 02/27/2014   IRON 46 02/27/2014   TIBC 335 02/27/2014   UIBC 289 02/27/2014   IRONPCTSAT 14 (L) 02/27/2014   Lab Results  Component Value Date   RBC 4.31 02/15/2017   No results found for: KPAFRELGTCHN, LAMBDASER, KAPLAMBRATIO No results found for: IGGSERUM, IGA, IGMSERUM No results found for: TOTALPROTELP, ALBUMINELP, A1GS, A2GS, BETS, BETA2SER, GAMS, MSPIKE, SPEI   Chemistry      Component Value Date/Time   NA 142  02/15/2017 1423   NA 141 11/24/2013 1323   K 3.7 02/15/2017 1423   K 3.9 11/24/2013 1323   CL 112 (H) 02/15/2017 1423   CL 97 (L) 11/24/2013 1323   CO2 23 02/15/2017 1423   CO2 26 11/24/2013 1323   BUN 13 02/15/2017 1423   BUN 10 11/24/2013 1323   CREATININE 0.80 02/15/2017 1423   CREATININE 0.75 01/18/2015 1518      Component Value Date/Time   CALCIUM 9.5 02/15/2017 1423   CALCIUM 9.0 11/24/2013 1323   ALKPHOS 84 02/15/2017 1423   ALKPHOS 54 11/24/2013 1323   AST 28 02/15/2017 1423   ALT 16 02/15/2017 1423   ALT 17 11/24/2013 1323   BILITOT 0.6 02/15/2017 1423      Impression and Plan: Ashley Mosley is a  very pleasant 57 yo caucasian female with chronic mild leukocytosis. WBC is stable at 12.3. No anemia and platelet count is stable at 419.  We will see what her iron studies show and bring her back in for infusion if needed.  She was concerned with being exposed to Hep C so we have checked this as well. Results are pending.  Once we have all her lab work back we will contact her and schedule a follow-up.  She will let us know if she has any other questions or concerns. We can certainly see her sooner if need be.   Ashley Peace, NP 2/7/20194:26 PM

## 2017-02-16 LAB — HCV COMMENT:

## 2017-02-16 LAB — LACTATE DEHYDROGENASE: LDH: 228 U/L (ref 125–245)

## 2017-02-16 LAB — RETICULOCYTES
RBC.: 4.31 MIL/uL (ref 3.70–5.45)
RETIC COUNT ABSOLUTE: 69 10*3/uL (ref 33.7–90.7)
Retic Ct Pct: 1.6 % (ref 0.7–2.1)

## 2017-02-16 LAB — IRON AND TIBC
IRON: 66 ug/dL (ref 41–142)
SATURATION RATIOS: 19 % — AB (ref 21–57)
TIBC: 345 ug/dL (ref 236–444)
UIBC: 279 ug/dL

## 2017-02-16 LAB — FERRITIN: Ferritin: 104 ng/mL (ref 9–269)

## 2017-02-16 LAB — HEPATITIS C ANTIBODY (REFLEX): HCV Ab: <0.1 s/co ratio (ref 0.0–0.9)

## 2017-03-19 ENCOUNTER — Ambulatory Visit: Payer: 59 | Admitting: Obstetrics and Gynecology

## 2017-04-11 ENCOUNTER — Other Ambulatory Visit: Payer: Self-pay | Admitting: Obstetrics and Gynecology

## 2017-04-11 DIAGNOSIS — N6320 Unspecified lump in the left breast, unspecified quadrant: Secondary | ICD-10-CM

## 2017-05-04 ENCOUNTER — Ambulatory Visit
Admission: RE | Admit: 2017-05-04 | Discharge: 2017-05-04 | Disposition: A | Discharge status: Home / Self Care | Payer: 59 | Source: Ambulatory Visit | Attending: Obstetrics and Gynecology | Admitting: Obstetrics and Gynecology

## 2017-05-04 DIAGNOSIS — N6320 Unspecified lump in the left breast, unspecified quadrant: Secondary | ICD-10-CM

## 2017-05-28 ENCOUNTER — Encounter: Payer: Self-pay | Admitting: Obstetrics and Gynecology

## 2017-05-28 ENCOUNTER — Ambulatory Visit: Payer: 59 | Admitting: Obstetrics and Gynecology

## 2017-05-28 ENCOUNTER — Telehealth: Payer: Self-pay | Admitting: Obstetrics and Gynecology

## 2017-05-28 NOTE — Telephone Encounter (Signed)
Patient left voicemail stating that she missed her appointment due to her father having heart issues and needing to be with him.

## 2017-05-28 NOTE — Progress Notes (Deleted)
57 y.o. [id number] Married Caucasian female here for annual exam.    PCP:     Patient's last menstrual period was 01/29/2015 (exact date).           Sexually active: {yes no:314532}  The current method of family planning is tubal ligation--Essure.    Exercising: {yes no:314532}  {types:19826} Smoker:  {YES [id number]  Health Maintenance: Pap:  03/13/16 Neg:Neg HR HPV; 03/31/16 Atypia of the cervix. No condyloma, dysplasia, or malignancy of the cervix. 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  She started smoking about 38 years ago. She has a 17.50 pack-year smoking history. She has never used smokeless tobacco. She reports that she does not drink alcohol or use drugs.  Past Medical History:  Diagnosis Date  . 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  Marland Kitchen Abnormal uterine bleeding   . Anemia   . Anxiety   . Blood transfusion without reported diagnosis    age 60  . Depression   . Dysmenorrhea   . Elevated hemoglobin A1c 2014  . Endometriosis   . Fibroid 2012, 2015, 2017      . Gastric ulcer 2014  . Nervous breakdown 07/2013   [facility] in [place]  . PTSD (post-traumatic stress disorder) 2018  . [place] spotted fever 1970   hospitalized x 1 month, had a blood transfusion  . Thrombocytosis ([place]) 2014    Past Surgical History:  Procedure Laterality Date  . CESAREAN SECTION  1993  . COLPOSCOPY  2010  . ESSURE TUBAL LIGATION  2010  . HYSTEROSCOPY W/D&C N/A 12/17/2012   Procedure: DILATATION AND CURETTAGE /HYSTEROSCOPY AND RESECTOSCOPE;  Surgeon: Jamey Reas de Berton Lan,  MD;  Location: [facility];  Service: Gynecology;  Laterality: N/A;  . PELVIC LAPAROSCOPY    . TUBAL LIGATION     Essure    Current Outpatient Medications  Medication Sig Dispense Refill  . aspirin 81 MG chewable tablet Chew 81 mg by mouth daily. Takes once to three times a week.    . metFORMIN (GLUCOPHAGE) 500 MG tablet Take 500 mg by mouth at bedtime.    . Multiple Vitamins-Minerals (MULTIVITAL) CHEW Chew by mouth every morning.    . prazosin (MINIPRESS) 2 MG capsule Take 2 mg by mouth at bedtime.     No current facility-administered medications for this visit.     Family History  Problem Relation Age of Onset  . Diabetes Father     Review of Systems  Exam:   LMP 01/29/2015 (Exact Date)     General appearance: alert, cooperative and appears stated age Head: Normocephalic, without obvious abnormality, atraumatic Neck: no adenopathy, supple, symmetrical, trachea midline and thyroid normal to inspection and palpation Lungs: clear to auscultation bilaterally Breasts: normal appearance, no masses or tenderness, No nipple retraction or dimpling, No nipple discharge or bleeding, No axillary or supraclavicular adenopathy Heart: regular rate and rhythm Abdomen: soft, non-tender; no masses, no organomegaly Extremities: extremities normal, atraumatic, no cyanosis or edema Skin: Skin color, texture, turgor normal. No rashes or lesions Lymph nodes:  Cervical, supraclavicular, and axillary nodes normal. No abnormal inguinal nodes palpated Neurologic: Grossly normal  Pelvic: External genitalia:  no lesions              Urethra:  normal appearing urethra with no masses, tenderness or lesions              Bartholins and Skenes: normal                 Vagina: normal appearing vagina with normal color and discharge, no lesions              Cervix: no lesions              Pap taken: {yes no:314532} Bimanual Exam:  Uterus:  normal size, contour, position, consistency, mobility, non-tender               Adnexa: no mass, fullness, tenderness              Rectal exam: {yes no:314532}.  Confirms.              Anus:  normal sphincter tone, no lesions  Chaperone was present for exam.  Assessment:   Well woman visit with normal exam.   Plan: Mammogram screening. Recommended self breast awareness. Pap and HR HPV as above. Guidelines for Calcium, Vitamin D, regular exercise program including cardiovascular and weight bearing exercise.   Follow up annually and prn.   Additional counseling given.  {yes [id number]. _______ minutes face to face time of which over 50% was spent in counseling.    After visit summary provided.

## 2017-06-06 ENCOUNTER — Telehealth: Payer: Self-pay | Admitting: *Deleted

## 2017-06-06 NOTE — Telephone Encounter (Signed)
Patient is in 08 recall for 03/2017. Patient missed her 05/28/17 AEX visit due to caring for her father. Please advise on recall status. Would you like to extend he recall? Thanks

## 2017-06-07 NOTE — Telephone Encounter (Signed)
Ok to extend the recall to July 2019.

## 2017-06-07 NOTE — Telephone Encounter (Signed)
Recall extended through July 2019. -eh

## 2017-08-27 ENCOUNTER — Telehealth: Payer: Self-pay

## 2017-08-27 NOTE — Telephone Encounter (Signed)
-----   Message from Michele Mcalpine, RN sent at 08/21/2017 12:04 PM EDT ----- Regarding: 08 Recall  This patient is in 08 recall for abnormal pap and is due for follow up pap smear. Can you please call and schedule annual exam or appointment for follow up pap smear? Thank you.

## 2017-08-27 NOTE — Telephone Encounter (Signed)
Tried calling patient to schedule AEX. No answer, left message for patient to call our office back to schedule appointment.

## 2017-08-31 NOTE — Telephone Encounter (Signed)
2nd attempt to reach patient and schedule AEX. No answer, message left on patient's voicemail to call our office back to schedule.

## 2017-09-25 ENCOUNTER — Telehealth: Payer: Self-pay

## 2017-09-25 NOTE — Telephone Encounter (Signed)
-----   Message from Michele Mcalpine, RN sent at 08/21/2017 12:04 PM EDT ----- Regarding: 08 Recall  This patient is in 08 recall for abnormal pap and is due for follow up pap smear. Can you please call and schedule annual exam or appointment for follow up pap smear? Thank you.

## 2017-09-25 NOTE — Telephone Encounter (Signed)
Spoke with patient and advised her she needed her annual exam/follow up pap.  Patient states she is at work and will call back to schedule.

## 2017-10-23 ENCOUNTER — Telehealth: Payer: Self-pay

## 2017-10-23 NOTE — Telephone Encounter (Signed)
Left message to call [employer] at [phone].  Regarding: 08 Recall  This patient is in 08 recall for abnormal pap and is due for follow up pap smear.

## 2017-11-06 NOTE — Telephone Encounter (Signed)
Left message to call [employer] at [phone]. 

## 2017-11-08 NOTE — Telephone Encounter (Signed)
Dr.Jertson, patient has been notified of the need to schedule an aex for pap. Patient has not returned call to schedule and we have been unable to reach her again to schedule. Please advise.

## 2017-11-08 NOTE — Telephone Encounter (Signed)
Send a 30 day letter

## 2017-11-13 NOTE — Telephone Encounter (Signed)
Letter mailed to patients home address on file.

## 2017-11-13 NOTE — Telephone Encounter (Signed)
This is a Dr.Silva patient. Routing to [place] for final review. Letter written and to Dr.Silva if she would like to proceed with sending.

## 2017-11-13 NOTE — Telephone Encounter (Signed)
I signed the 30 day letter.

## 2017-11-30 ENCOUNTER — Telehealth: Payer: Self-pay | Admitting: Obstetrics and Gynecology

## 2017-11-30 NOTE — Telephone Encounter (Signed)
Patient called to schedule her aex after receiving a 30 day letter from our office. She did schedule 12/28/17 requesting late Friday afternoons only. She wanted to let Dr.Silva know that she has been absent due to her father's heart attack and husbands's stroke. She said "I just had a lot going on due to these issues".

## 2017-11-30 NOTE — Telephone Encounter (Signed)
Reviewed message.  Patient scheduled annual exam.

## 2017-12-03 NOTE — Telephone Encounter (Signed)
Call to patient. Unable to schedule earlier appointment.  Other medical appointments due to husband illness.  Reviewed letter mailed on 11-13-17. Advised if unable to keep appointment on 12-28-17 would need to find alternative to care.   Routing to Dr Quincy Simmonds. Encounter closed.

## 2017-12-27 NOTE — Progress Notes (Signed)
57 y.o. [id number] Married Caucasian female here for annual exam.    Patient complains of decrease sexual interest.  Urinary odor.  Denies vaginal bleeding.   Husband had a stroke.  He is doing well. Husband dx with hep C.   Patient is negative with hep C.  Patient states she is negative for HIV, syphilis, and hep B.  Negative GC/CT 12/11/12.  Routine labs with PCP.   Now seeing [facility] in [place].  Urine dip - negative.   PCP:  Blair Heys, PA-C   Patient's last menstrual period was 01/29/2015 (exact date).           Sexually active: Yes.   female The current method of family planning is tubal ligation--Essure.    Exercising: Yes.    walks daily/yard work. Smoker:  Yes, smokes 1ppd  Health Maintenance: Pap: 03-13-16 Neg:Neg HR HPV, 01-18-15 Neg:Neg HR HPV History of abnormal Pap:  Yes, Abnormal pap x3 from 2007-2011 with colposcopy.   Colposcopy with biopsy on 02/11/15 - LGSIL, ECC benign. MMG: 05-04-17 Diag.Bil.w/US--Stable prob.benign cyst Lt.Br.-Rec.f/u Lt.Br.Diag.and poss.[place] in 12 mo.  Neg Rt.Br./density C/BiRads3 Colonoscopy: 12-04-12 polyps with Dr.Mann;2016 - polyp;next ?5 years BMD:   n/a  Result  n/a TDaP: ?2007 Gardasil:   no HIV: 2010 Neg per patient Hep C: 02-15-17 Neg Screening Labs:  Hb today: PCP   reports that she has been smoking cigarettes. She started smoking about 39 years ago. She has a 35.00 pack-year smoking history. She has never used smokeless tobacco. She reports that she does not drink alcohol or use drugs.  Past Medical History:  Diagnosis Date  . 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  Marland Kitchen Abnormal uterine bleeding   . Anemia   . Anxiety   . Blood transfusion without reported diagnosis    age 57  . Depression   . Dysmenorrhea   . Elevated hemoglobin A1c 2014  . Endometriosis   . Fibroid 2012, 2015, 2017      . Gastric ulcer 2014  . History of kidney stones   . Nervous breakdown 07/2013   [facility] in [place]  . PTSD (post-traumatic stress disorder) 2018  . PTSD (post-traumatic stress disorder)   . [place] spotted fever 1970   hospitalized x 1 month, had a blood transfusion  . Thrombocytosis ([place]) 2014    Past Surgical History:  Procedure Laterality Date  . CESAREAN SECTION  1993  . COLPOSCOPY  2010  . ESSURE TUBAL LIGATION  2010  . HYSTEROSCOPY W/D&C N/A 12/17/2012   Procedure: DILATATION AND CURETTAGE /HYSTEROSCOPY AND RESECTOSCOPE;  Surgeon: Jamey Reas de Berton Lan, MD;  Location: [facility];  Service: Gynecology;  Laterality: N/A;  . PELVIC LAPAROSCOPY    . TUBAL LIGATION     Essure    Current Outpatient Medications  Medication Sig Dispense Refill  . aspirin 81 MG chewable tablet Chew 81 mg by mouth daily. Takes once to three times a week.    . Multiple Vitamins-Minerals (MULTIVITAL) CHEW Chew by mouth every morning.    . prazosin (MINIPRESS) 2 MG capsule Take 2 mg by mouth at bedtime.     No current facility-administered medications for this visit.     Family History  Problem Relation Age of Onset  . Dementia Mother   . Diabetes Father     Review of Systems  Genitourinary: Positive for frequency and urgency.  Neurological:  Depression Trouble with memory  All other systems reviewed and are negative.   Exam:   BP 140/82 (BP Location: Right Arm, Patient Position: Sitting, Cuff Size: Normal)   Pulse 80   Resp 16   Ht 5\' 3"  (1.6 m)   Wt 122 lb 9.6 oz (55.6 kg)   LMP 01/29/2015 (Exact Date)   BMI 21.72 kg/m     General appearance: alert, cooperative and appears stated age Head: Normocephalic, without obvious abnormality, atraumatic Neck: no adenopathy, supple, symmetrical, trachea midline and thyroid normal to inspection and palpation Lungs: clear to auscultation bilaterally Breasts: normal appearance, no masses or tenderness, No nipple retraction or dimpling, No nipple discharge or bleeding, No axillary or  supraclavicular adenopathy Heart: regular rate and rhythm Abdomen: soft, non-tender; no masses, no organomegaly Extremities: extremities normal, atraumatic, no cyanosis or edema Skin: Skin color, texture, turgor normal. No rashes or lesions Lymph nodes: Cervical, supraclavicular, and axillary nodes normal. No abnormal inguinal nodes palpated Neurologic: Grossly normal  Pelvic: External genitalia:  no lesions              Urethra:  normal appearing urethra with no masses, tenderness or lesions              Bartholins and Skenes: normal                 Vagina: normal appearing vagina with normal color and discharge, no lesions.  Subtle white? area 4 mm at 12:00              Cervix: no lesions              Pap taken: Yes.   Bimanual Exam:  Uterus:  normal size, contour, position, consistency, mobility, non-tender              Adnexa: no mass, fullness, tenderness              Rectal exam: Yes.  .  Confirms.              Anus:  normal sphincter tone, no lesions  Chaperone was present for exam.  Assessment:   Well woman visit with normal exam. Hx Essure.   Hx LGSIL.  New dx DM.  Smoker.  Renal stones.  Urine odor.   Has PTSD and not bipoloar disorder.  Plan: Mammogram screening. Recommended self breast awareness. Pap and HR HPV as above. Guidelines for Calcium, Vitamin D, regular exercise program including cardiovascular and weight bearing exercise. TDap.  Flu vaccine recommended.  Follow up annually and prn.   After visit summary provided.

## 2017-12-28 ENCOUNTER — Other Ambulatory Visit: Payer: Self-pay

## 2017-12-28 ENCOUNTER — Other Ambulatory Visit (HOSPITAL_COMMUNITY)
Admission: RE | Admit: 2017-12-28 | Discharge: 2017-12-28 | Disposition: A | Discharge status: Home / Self Care | Payer: 59 | Source: Ambulatory Visit | Attending: Obstetrics and Gynecology | Admitting: Obstetrics and Gynecology

## 2017-12-28 ENCOUNTER — Encounter: Payer: Self-pay | Admitting: Obstetrics and Gynecology

## 2017-12-28 ENCOUNTER — Ambulatory Visit (INDEPENDENT_AMBULATORY_CARE_PROVIDER_SITE_OTHER): Payer: 59 | Admitting: Obstetrics and Gynecology

## 2017-12-28 VITALS — BP 140/82 | HR 80 | Resp 16 | Ht 63.0 in | Wt 122.6 lb

## 2017-12-28 DIAGNOSIS — Z01419 Encounter for gynecological examination (general) (routine) without abnormal findings: Secondary | ICD-10-CM | POA: Insufficient documentation

## 2017-12-28 DIAGNOSIS — Z23 Encounter for immunization: Secondary | ICD-10-CM | POA: Diagnosis not present

## 2017-12-28 DIAGNOSIS — R8290 Unspecified abnormal findings in urine: Secondary | ICD-10-CM | POA: Diagnosis not present

## 2017-12-28 NOTE — Patient Instructions (Signed)

## 2018-01-04 ENCOUNTER — Telehealth: Payer: Self-pay | Admitting: Obstetrics and Gynecology

## 2018-01-04 NOTE — Telephone Encounter (Signed)
Spoke with Ashley Mosley at [facility] cytology, was advised 12/28/17 pap should be completed by 01/07/18. Needs re-screen by [employer], chosen as part of random re-screen process. .   Call returned to patient, advised per lab. Advised patient once results are completed and reviewed by Dr. Quincy Simmonds, our office will return call. Patient verbalizes understanding .   Routing to provider for final review. Patient is agreeable to disposition. Will close encounter.

## 2018-01-04 NOTE — Telephone Encounter (Signed)
Patient calling for results.

## 2018-01-07 ENCOUNTER — Telehealth: Payer: Self-pay | Admitting: Obstetrics and Gynecology

## 2018-01-07 LAB — CYTOLOGY - PAP
DIAGNOSIS: NEGATIVE
HPV: NOT DETECTED

## 2018-01-07 NOTE — Telephone Encounter (Signed)
Spoke with Peter Congo at [facility]. 12/28/17 pap not resulted.  Peter Congo will review with Cy[employer] Tech and return call to office. See previous telephone encounter dated 01/04/18.

## 2018-01-07 NOTE — Telephone Encounter (Signed)
Results and note released to patient through My Chart.  Pap finalized and normal.  HR HPV testing was not done.  Nursing staff is adding this to her pap and faxing to the lab. I will let patient know when it is ready.   Cc- Marisa Sprinkles

## 2018-01-07 NOTE — Telephone Encounter (Signed)
Patient is calling requesting test results.

## 2018-01-07 NOTE — Telephone Encounter (Signed)
Spoke with Peter Congo, was advised PAP dated 12/28/17 has not been signed out and completed by [employer] Tech. Was advised PAP 12/28/17 Negative for intraepithelial lesions or malignancy.   Routing to Dr. Quincy Simmonds

## 2018-01-08 NOTE — Telephone Encounter (Signed)
Spoke with patient, advised as seen below per Dr. Quincy Simmonds.   Patient reports urinary symptoms have not resolved since AEX on 12/20. Reports urinary frequency, voided 17 times on 12/30 between hrs of 5:30am -2:30pm. Reports increased water intake, voiding normal amounts. Has reduced coffee intake.  Reports lower back pain and chills, no fever. Denies vaginal d/c or odor. Patient states she was seen by PCP after her AEX, started on Z-pak for "cold/flu like" symptoms. Advised patient will review with Dr. Quincy Simmonds and return call with recommendations. Advised Dr. Quincy Simmonds is in the OR, response may not be immediate.   12/28/17 urine dip neg  Routing to Dr. Quincy Simmonds.

## 2018-01-08 NOTE — Telephone Encounter (Signed)
Patient did not return call, call returned to patient to f/u. Patient reports urinary frequency has reduced today. Patient request OV today after 4:30pm with any provider. Offered patient OV on 1/2 with Dr. Quincy Simmonds, patient declined stating she has another appt. Advised patient if new symptoms develop or symptoms worsen,  seek care at local ER/Urgent care, our office will be closed on 01/09/18. If symptoms do not resolve, OV recommended for further evaluation. Patient verbalizes understanding.  Routing to provider for final review. Patient is agreeable to disposition. Will close encounter.

## 2018-01-08 NOTE — Telephone Encounter (Signed)
Spoke with patient, advised as seen below per Dr. Quincy Simmonds. Offered OV with Dr. Quincy Simmonds today at 11:30am, patient declined due to work schedule, request OV after 2pm. Offered OV with Melvia Heaps, CNM at 2:30pm, patient states she will need to review with her employer and return call. Patient verbalizes understanding and is agreeable.

## 2018-01-08 NOTE — Telephone Encounter (Signed)
Notes recorded by Lowella Fairy, CMA on 01/07/2018 at 4:36 PM EST Faxed form to [facility] to add on HR HPV typing. ------  Notes recorded by Nunzio Cobbs, MD on 01/07/2018 at 4:32 PM EST Results to patient through My Chart.  Please have the lab add high risk HPV testing.   Hi Ashley Mosley,   Your pap was normal.  I need to ask the lab to add the high risk human papilloma virus testing, which was not done.   I am sorry for any delay.   Have a good evening!  Josefa Half, MD

## 2018-01-08 NOTE — Telephone Encounter (Signed)
Patient needs to be seen to rule out UTI.

## 2018-01-18 ENCOUNTER — Telehealth: Payer: Self-pay | Admitting: Obstetrics and Gynecology

## 2018-01-18 NOTE — Telephone Encounter (Signed)
Patient is calling for her recent results. °

## 2018-01-18 NOTE — Telephone Encounter (Signed)
Call to patient.  She is given results  Of pap smear., she needed to receive HPV results, advised negative and normal.  Will follow up prn.  Encounter closed.

## 2018-02-05 ENCOUNTER — Telehealth: Payer: Self-pay | Admitting: *Deleted

## 2018-02-05 NOTE — Telephone Encounter (Signed)
REFERRAL SENT TO [facility] AND NOTES ON FILE FROM [facility] [place], [place].

## 2018-04-03 ENCOUNTER — Telehealth: Payer: Self-pay

## 2018-04-03 NOTE — Telephone Encounter (Signed)
   Primary Cardiologist:  No primary care provider on file.   Patient contacted.  History reviewed.  No symptoms to suggest any unstable cardiac conditions.  Based on discussion, with current pandemic situation, we will be postponing this appointment for Ashley Mosley with a plan for f/u in  wks or sooner if feasible/necessary.  If symptoms change, she has been instructed to contact our office.   Routing to C19 CANCEL pool for tracking (P CV DIV CV19 CANCEL - reason for visit "other.") and assigning priority (1 = 4-6 wks, 2 = 6-12 wks, 3 = >12 wks).   Jones Broom, CMA  04/03/2018 2:25 PM         .

## 2018-04-09 ENCOUNTER — Ambulatory Visit: Payer: 59 | Admitting: Cardiovascular Disease

## 2018-05-01 ENCOUNTER — Telehealth: Payer: Self-pay

## 2018-05-01 NOTE — Telephone Encounter (Signed)
Attempted to call pt to set up virtual visit with Ashley Mosley on 4/23 or 4/24  Pt does not have VM available, unable to LM at this time

## 2018-06-17 ENCOUNTER — Other Ambulatory Visit: Payer: Self-pay | Admitting: Physician Assistant

## 2018-06-17 DIAGNOSIS — N6002 Solitary cyst of left breast: Secondary | ICD-10-CM

## 2018-06-26 ENCOUNTER — Other Ambulatory Visit: Payer: Self-pay | Admitting: Obstetrics and Gynecology

## 2018-06-26 DIAGNOSIS — N6002 Solitary cyst of left breast: Secondary | ICD-10-CM

## 2018-06-27 ENCOUNTER — Other Ambulatory Visit: Payer: 59

## 2018-06-28 ENCOUNTER — Ambulatory Visit
Admission: RE | Admit: 2018-06-28 | Discharge: 2018-06-28 | Disposition: A | Discharge status: Home / Self Care | Payer: 59 | Source: Ambulatory Visit | Attending: Physician Assistant | Admitting: Physician Assistant

## 2018-06-28 ENCOUNTER — Other Ambulatory Visit: Payer: Self-pay

## 2018-06-28 DIAGNOSIS — N6002 Solitary cyst of left breast: Secondary | ICD-10-CM

## 2018-07-15 ENCOUNTER — Telehealth: Payer: Self-pay | Admitting: Nurse Practitioner

## 2018-07-15 NOTE — Telephone Encounter (Signed)
Left detailed message for patient to call back regarding virtual visit scheduled for 7/10. I advised that we are in the office on this day and to call back regarding preference for appointment type. If patient chooses to remain virtual, will ask her to change appointment to 7/7 or 7/9 on a day when Dr. Acie Fredrickson is doing only virtual visits.

## 2018-07-16 NOTE — Telephone Encounter (Signed)
Spoke with pt and she needed to reschedule her appt because of death in the family. Pt has been rescheduled to an in office visit on 7/21 at 8:20am.

## 2018-07-19 ENCOUNTER — Telehealth: Payer: 59 | Admitting: Cardiovascular Disease

## 2018-07-29 ENCOUNTER — Telehealth: Payer: Self-pay | Admitting: Cardiovascular Disease

## 2018-07-29 NOTE — Telephone Encounter (Signed)
error 

## 2018-07-30 ENCOUNTER — Ambulatory Visit: Payer: 59 | Admitting: Cardiovascular Disease

## 2018-09-19 ENCOUNTER — Ambulatory Visit: Payer: 59 | Admitting: Cardiovascular Disease

## 2019-01-13 ENCOUNTER — Ambulatory Visit: Payer: 59 | Admitting: Obstetrics and Gynecology

## 2019-01-14 ENCOUNTER — Institutional Professional Consult (permissible substitution): Payer: 59 | Admitting: Internal Medicine

## 2019-02-03 NOTE — Progress Notes (Deleted)
59 y.o. [id number] Married Caucasian female here for annual exam.    PCP:     Patient's last menstrual period was 01/29/2015 (exact date).           Sexually active: {yes no:314532}  The current method of family planning is tubal ligation--Essure.    Exercising: {yes no:314532}  {types:19826} Smoker:  {YES NO:22349}  Health Maintenance: Pap: 12-28-17 Neg:Neg HR HPV,03-13-16 Neg:Neg HR HPV, 01-18-15 Neg:Neg HR HPV History of abnormal Pap:  Yes, Abnormal pap x3 from 2007-2011 with colposcopy.Colposcopy with biopsy on 02/11/15 - LGSIL, ECC benign. MMG: 06-28-18 Diag.Bil.w/Lt.US/Neg/densityC/BiRads2 Colonoscopy: ***12-04-12 polyps;next due BMD:   n/a  Result  n/a TDaP: 12-28-17 Gardasil:   no HIV: 2010 Neg per patient Hep C: 02-15-17 Neg Screening Labs:  Hb today: ***, Urine today: ***   reports that she has been smoking cigarettes. She started smoking about 40 years ago. She has a 35.00 pack-year smoking history. She has never used smokeless tobacco. She reports that she does not drink alcohol or use drugs.  Past Medical History:  Diagnosis Date  . Abnormal finding on mammography, microcalcification 06/17/2012  . 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  Marland Kitchen Abnormal uterine bleeding   . Acid reflux 02/05/2008   Overview:  Esophageal Reflux   . Anemia   . Anxiety   . B-complex deficiency 09/07/2006   Overview:  Vitamin B12 Deficiency   . Blood transfusion without reported diagnosis    age 24  . COPD with emphysema ([place])   . Depression   . Dizzy   . Dysmenorrhea   . Dyspnea on exertion   . Elevated hemoglobin A1c 2014  . Endometrial mass 11/07/2012  . Endometriosis   . Fibroid 2012, 2015, 2017      . Gastric ulcer 2014  . History of kidney stones   . Major depressive disorder, single episode, severe, specified as with psychotic behavior 07/22/2012  . Nervous breakdown 07/2013   [facility] in [place]  . PTSD (post-traumatic stress disorder) 2018  .  PTSD (post-traumatic stress disorder)   . [place] spotted fever 1970   hospitalized x 1 month, had a blood transfusion  . Thrombocytosis ([place]) 2014  . Uterine fibroid 11/07/2012    Past Surgical History:  Procedure Laterality Date  . CESAREAN SECTION  1993  . COLPOSCOPY  2010  . ESSURE TUBAL LIGATION  2010  . HYSTEROSCOPY WITH D & C N/A 12/17/2012   Procedure: DILATATION AND CURETTAGE /HYSTEROSCOPY AND RESECTOSCOPE;  Surgeon: Jamey Reas de Berton Lan, MD;  Location: [facility];  Service: Gynecology;  Laterality: N/A;  . PELVIC LAPAROSCOPY    . TUBAL LIGATION     Essure    Current Outpatient Medications  Medication Sig Dispense Refill  . aspirin 81 MG chewable tablet Chew 81 mg by mouth daily. Takes once to three times a week.    . Multiple Vitamins-Minerals (MULTIVITAL) CHEW Chew by mouth every morning.    . prazosin (MINIPRESS) 2 MG capsule Take 2 mg by mouth at bedtime.     No current facility-administered medications for this visit.    Family History  Problem Relation Age of Onset  . Dementia Mother   . Diabetes Father   . Heart disease Father     Review of Systems  Exam:   LMP 01/29/2015 (Exact Date)     General appearance: alert, cooperative and appears stated age Head: normocephalic, without obvious abnormality, atraumatic Neck: no  adenopathy, supple, symmetrical, trachea midline and thyroid normal to inspection and palpation Lungs: clear to auscultation bilaterally Breasts: normal appearance, no masses or tenderness, No nipple retraction or dimpling, No nipple discharge or bleeding, No axillary adenopathy Heart: regular rate and rhythm Abdomen: soft, non-tender; no masses, no organomegaly Extremities: extremities normal, atraumatic, no cyanosis or edema Skin: skin color, texture, turgor normal. No rashes or lesions Lymph nodes: cervical, supraclavicular, and axillary nodes normal. Neurologic: grossly normal  Pelvic: External genitalia:  no  lesions              No abnormal inguinal nodes palpated.              Urethra:  normal appearing urethra with no masses, tenderness or lesions              Bartholins and Skenes: normal                 Vagina: normal appearing vagina with normal color and discharge, no lesions              Cervix: no lesions              Pap taken: {yes no:314532} Bimanual Exam:  Uterus:  normal size, contour, position, consistency, mobility, non-tender              Adnexa: no mass, fullness, tenderness              Rectal exam: {yes no:314532}.  Confirms.              Anus:  normal sphincter tone, no lesions  Chaperone was present for exam.  Assessment:   Well woman visit with normal exam.   Plan: Mammogram screening discussed. Self breast awareness reviewed. Pap and HR HPV as above. Guidelines for Calcium, Vitamin D, regular exercise program including cardiovascular and weight bearing exercise.   Follow up annually and prn.   Additional counseling given.  {yes [id number]. _______ minutes face to face time of which over 50% was spent in counseling.    After visit summary provided.

## 2019-02-04 ENCOUNTER — Encounter: Payer: Self-pay | Admitting: Obstetrics and Gynecology

## 2019-02-04 ENCOUNTER — Ambulatory Visit: Payer: 59 | Admitting: Obstetrics and Gynecology

## 2019-04-15 ENCOUNTER — Telehealth: Payer: Self-pay | Admitting: *Deleted

## 2019-04-15 NOTE — Telephone Encounter (Signed)
Message left to return call to [facility] at [phone].   Patient in 08 recall for 01/21. Patient needs to schedule aex.

## 2019-04-29 ENCOUNTER — Ambulatory Visit: Payer: 59 | Admitting: Pulmonary Disease

## 2019-04-29 ENCOUNTER — Encounter: Payer: Self-pay | Admitting: Pulmonary Disease

## 2019-04-29 ENCOUNTER — Other Ambulatory Visit: Payer: Self-pay

## 2019-04-29 VITALS — BP 96/62 | HR 88 | Temp 98.8°F | Ht 64.0 in | Wt 118.4 lb

## 2019-04-29 DIAGNOSIS — G4733 Obstructive sleep apnea (adult) (pediatric): Secondary | ICD-10-CM | POA: Diagnosis not present

## 2019-04-29 DIAGNOSIS — R911 Solitary pulmonary nodule: Secondary | ICD-10-CM

## 2019-04-29 NOTE — Patient Instructions (Signed)
History of emphysema/lung nodules -Repeat CT scan to assess evaluation of lung nodules  History of snoring, apneas -Home sleep study to assess for significance of sleep disordered breathing  PFT to assess severity of obstructive lung disease  We will see you back in about 6 to 8 weeks  Call with significant concerns Sleep Apnea Sleep apnea is a condition in which breathing pauses or becomes shallow during sleep. Episodes of sleep apnea usually last 10 seconds or longer, and they may occur as many as 20 times an hour. Sleep apnea disrupts your sleep and keeps your body from getting the rest that it needs. This condition can increase your risk of certain health problems, including:  Heart attack.  Stroke.  Obesity.  Diabetes.  Heart failure.  Irregular heartbeat. What are the causes? There are three kinds of sleep apnea:  Obstructive sleep apnea. This kind is caused by a blocked or collapsed airway.  Central sleep apnea. This kind happens when the part of the brain that controls breathing does not send the correct signals to the muscles that control breathing.  Mixed sleep apnea. This is a combination of obstructive and central sleep apnea. The most common cause of this condition is a collapsed or blocked airway. An airway can collapse or become blocked if:  Your throat muscles are abnormally relaxed.  Your tongue and tonsils are larger than normal.  You are overweight.  Your airway is smaller than normal. What increases the risk? You are more likely to develop this condition if you:  Are overweight.  Smoke.  Have a smaller than normal airway.  Are elderly.  Are female.  Drink alcohol.  Take sedatives or tranquilizers.  Have a family history of sleep apnea. What are the signs or symptoms? Symptoms of this condition include:  Trouble staying asleep.  Daytime sleepiness and tiredness.  Irritability.  Loud snoring.  Morning headaches.  Trouble  concentrating.  Forgetfulness.  Decreased interest in sex.  Unexplained sleepiness.  Mood swings.  Personality changes.  Feelings of depression.  Waking up often during the night to urinate.  Dry mouth.  Sore throat. How is this diagnosed? This condition may be diagnosed with:  A medical history.  A physical exam.  A series of tests that are done while you are sleeping (sleep study). These tests are usually done in a sleep lab, but they may also be done at home. How is this treated? Treatment for this condition aims to restore normal breathing and to ease symptoms during sleep. It may involve managing health issues that can affect breathing, such as high blood pressure or obesity. Treatment may include:  Sleeping on your side.  Using a decongestant if you have nasal congestion.  Avoiding the use of depressants, including alcohol, sedatives, and narcotics.  Losing weight if you are overweight.  Making changes to your diet.  Quitting smoking.  Using a device to open your airway while you sleep, such as: ? An oral appliance. This is a custom-made mouthpiece that shifts your lower jaw forward. ? A continuous positive airway pressure (CPAP) device. This device blows air through a mask when you breathe out (exhale). ? A nasal expiratory positive airway pressure (EPAP) device. This device has valves that you put into each nostril. ? A bi-level positive airway pressure (BPAP) device. This device blows air through a mask when you breathe in (inhale) and breathe out (exhale).  Having surgery if other treatments do not work. During surgery, excess tissue is removed to  create a wider airway. It is important to get treatment for sleep apnea. Without treatment, this condition can lead to:  High blood pressure.  Coronary artery disease.  In men, an inability to achieve or maintain an erection (impotence).  Reduced thinking abilities. Follow these instructions at  home: Lifestyle  Make any lifestyle changes that your health care provider recommends.  Eat a healthy, well-balanced diet.  Take steps to lose weight if you are overweight.  Avoid using depressants, including alcohol, sedatives, and narcotics.  Do not use any products that contain nicotine or tobacco, such as cigarettes, e-cigarettes, and chewing tobacco. If you need help quitting, ask your health care provider. General instructions  Take over-the-counter and prescription medicines only as told by your health care provider.  If you were given a device to open your airway while you sleep, use it only as told by your health care provider.  If you are having surgery, make sure to tell your health care provider you have sleep apnea. You may need to bring your device with you.  Keep all follow-up visits as told by your health care provider. This is important. Contact a health care provider if:  The device that you received to open your airway during sleep is uncomfortable or does not seem to be working.  Your symptoms do not improve.  Your symptoms get worse. Get help right away if:  You develop: ? Chest pain. ? Shortness of breath. ? Discomfort in your back, arms, or stomach.  You have: ? Trouble speaking. ? Weakness on one side of your body. ? Drooping in your face. These symptoms may represent a serious problem that is an emergency. Do not wait to see if the symptoms will go away. Get medical help right away. Call your local emergency services (911 in the [place] Do not drive yourself to the hospital. Summary  Sleep apnea is a condition in which breathing pauses or becomes shallow during sleep.  The most common cause is a collapsed or blocked airway.  The goal of treatment is to restore normal breathing and to ease symptoms during sleep. This information is not intended to replace advice given to you by your health care provider. Make sure you discuss any questions you  have with your health care provider. Document Revised: 06/12/2018 Document Reviewed: 08/21/2017 Elsevier Patient Education  [place].

## 2019-04-29 NOTE — Progress Notes (Signed)
Ashley Mosley    [id number]    1960/09/18  Primary Care Physician:Long, Genevie Cheshire  Referring Physician: Blair Heys, PA-C [address] [place],  [place] [address]  Chief complaint:   Patient being seen for history of snoring, witnessed apneas  HPI:  History of snoring, witnessed apneas History of obstructive lung disease with lung nodules in the past   She has been told she snores Wakes up periodically feeling she cannot breathe, this happens frequently when she is trying to fall asleep Usually goes to bed between 9 and 10 Wakes up a few times during the night Sleep is nonrestorative  Has a history of emphysema History of lung nodules The last CT scan in November 2020 did reveal a new nodule in the right upper lobe She is known to have emphysema  An active smoker, pack a day smoker, 40-pack-year smoking history -Not likely to quit  Did have hemoptysis in the past that self resolved   Outpatient Encounter Medications as of 04/29/2019  Medication Sig  . aspirin 81 MG chewable tablet Chew 81 mg by mouth daily. Takes once to three times a week.  . Multiple Vitamins-Minerals (MULTIVITAL) CHEW Chew by mouth every morning.  . prazosin (MINIPRESS) 2 MG capsule Take 2 mg by mouth at bedtime.   No facility-administered encounter medications on file as of 04/29/2019.    Allergies as of 04/29/2019 - Review Complete 04/29/2019  Allergen Reaction Noted  . Codeine Nausea Only 12/09/2012    Past Medical History:  Diagnosis Date  . Abnormal finding on mammography, microcalcification 06/17/2012  . 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  Marland Kitchen Abnormal uterine bleeding   . Acid reflux 02/05/2008   Overview:  Esophageal Reflux   . Anemia   . Anxiety   . B-complex deficiency 09/07/2006   Overview:  Vitamin B12 Deficiency   . Blood transfusion without reported diagnosis    age 59  . COPD with emphysema ([place])   . Depression   . Dizzy   .  Dysmenorrhea   . Dyspnea on exertion   . Elevated hemoglobin A1c 2014  . Endometrial mass 11/07/2012  . Endometriosis   . Fibroid 2012, 2015, 2017      . Gastric ulcer 2014  . History of kidney stones   . Major depressive disorder, single episode, severe, specified as with psychotic behavior 07/22/2012  . Nervous breakdown 07/2013   [facility] in [place]  . PTSD (post-traumatic stress disorder) 2018  . PTSD (post-traumatic stress disorder)   . [place] spotted fever 1970   hospitalized x 1 month, had a blood transfusion  . Thrombocytosis ([employer]) 2014  . Uterine fibroid 11/07/2012    Past Surgical History:  Procedure Laterality Date  . CESAREAN SECTION  1993  . COLPOSCOPY  2010  . ESSURE TUBAL LIGATION  2010  . HYSTEROSCOPY WITH D & C N/A 12/17/2012   Procedure: DILATATION AND CURETTAGE /HYSTEROSCOPY AND RESECTOSCOPE;  Surgeon: Jamey Reas de Berton Lan, MD;  Location: [facility];  Service: Gynecology;  Laterality: N/A;  . PELVIC LAPAROSCOPY    . TUBAL LIGATION     Essure    Family History  Problem Relation Age of Onset  . Dementia Mother   . Diabetes Father   . Heart disease Father     Social History   Socioeconomic History  . Marital status: Married    Spouse  name: Not on file  . Number of children: Not on file  . Years of education: Not on file  . Highest education level: Not on file  Occupational History  . Not on file  Tobacco Use  . Smoking status: Current Every Day Smoker    Packs/day: 2.00    Years: 35.00    Pack years: 70.00    Types: Cigarettes    Start date: 12/09/1978  . Smokeless tobacco: Never Used  . Tobacco comment: smoking one pack a day now  Substance and Sexual Activity  . Alcohol use: No    Alcohol/week: 0.0 standard drinks  . Drug use: No  . Sexual activity: Yes    Partners: Male    Birth control/protection: Other-see comments    Comment: Essure  Other Topics Concern  . Not on file  Social History Narrative  .  Not on file   Social Determinants of Health   Financial Resource Strain:   . Difficulty of Paying Living Expenses:   Food Insecurity:   . Worried About Charity fundraiser in the Last Year:   . Arboriculturist in the Last Year:   Transportation Needs:   . Film/video editor (Medical):   Marland Kitchen Lack of Transportation (Non-Medical):   Physical Activity:   . Days of Exercise per Week:   . Minutes of Exercise per Session:   Stress:   . Feeling of Stress :   Social Connections:   . Frequency of Communication with Friends and Family:   . Frequency of Social Gatherings with Friends and Family:   . Attends Religious Services:   . Active Member of Clubs or Organizations:   . Attends Archivist Meetings:   Marland Kitchen Marital Status:   Intimate Partner Violence:   . Fear of Current or Ex-Partner:   . Emotionally Abused:   Marland Kitchen Physically Abused:   . Sexually Abused:     Review of Systems  Constitutional: Negative.   Respiratory: Negative for shortness of breath.   Cardiovascular: Negative.  Negative for chest pain.  Psychiatric/Behavioral: Negative.     Vitals:   04/29/19 1411  BP: 96/62  Pulse: 88  Temp: 98.8 F (37.1 C)  SpO2: 94%    Physical Exam  Constitutional: She appears well-developed and well-nourished.  HENT:  Head: Normocephalic and atraumatic.  Eyes: Pupils are equal, round, and reactive to light. Conjunctivae are normal. Right eye exhibits no discharge. Left eye exhibits no discharge.  Neck: No tracheal deviation present. No thyromegaly present.  Cardiovascular: Normal rate and regular rhythm.  Pulmonary/Chest: Effort normal and breath sounds normal. No respiratory distress. She has no wheezes. She has no rales. She exhibits no tenderness.  Musculoskeletal:     Cervical back: Normal range of motion and neck supple.    Data Reviewed: Previous CAT scan report from 2020 from [facility] reviewed, multiple lung nodules with emphysema  Assessment:  Chronic  obstructive pulmonary disease  -She is using Anoro and albuterol -Continue inhalers  Lung nodules -Repeat CT scan of the chest to follow-up on lung nodules, a new nodule was noted in November 2020  Moderate possibility of significant obstructive sleep apnea -We will order a home sleep study  Pathophysiology of sleep disordered breathing discussed with the patient Treatment options for sleep disordered breathing discussed with patient  Plan/Recommendations: We will schedule a home sleep study CT scan of the chest without contrast Obtain PFT  Continue inhalers  I will see her in about  6 weeks  Smoking cessation counseling   Sherrilyn Rist MD [facility] 04/29/2019, 2:43 PM  CC: Blair Heys, PA-C

## 2019-05-02 ENCOUNTER — Ambulatory Visit (HOSPITAL_BASED_OUTPATIENT_CLINIC_OR_DEPARTMENT_OTHER): Payer: 59

## 2019-05-09 ENCOUNTER — Other Ambulatory Visit: Payer: Self-pay

## 2019-05-09 ENCOUNTER — Ambulatory Visit (HOSPITAL_BASED_OUTPATIENT_CLINIC_OR_DEPARTMENT_OTHER)
Admission: RE | Admit: 2019-05-09 | Discharge: 2019-05-09 | Disposition: A | Discharge status: Home / Self Care | Payer: 59 | Source: Ambulatory Visit | Attending: Pulmonary Disease | Admitting: Pulmonary Disease

## 2019-05-09 DIAGNOSIS — R911 Solitary pulmonary nodule: Secondary | ICD-10-CM | POA: Insufficient documentation

## 2019-05-09 NOTE — Telephone Encounter (Signed)
Message left to return call to Anjeli Casad at [phone].    

## 2019-05-13 NOTE — Telephone Encounter (Signed)
Attempted x2 to reach patient in regards to recall. No return call. Routing to K. Sprague, RN.

## 2019-05-16 NOTE — Telephone Encounter (Signed)
Letter to Dr.Silva to review.

## 2019-05-19 NOTE — Telephone Encounter (Signed)
Letter mailed to patient's home address on file. Removed from recall. Encounter closed. 

## 2019-05-19 NOTE — Telephone Encounter (Signed)
Letter signed by me and returned to you.

## 2019-06-03 ENCOUNTER — Encounter: Payer: Self-pay | Admitting: Pulmonary Disease

## 2019-06-06 ENCOUNTER — Other Ambulatory Visit (HOSPITAL_COMMUNITY): Payer: 59

## 2019-07-22 ENCOUNTER — Other Ambulatory Visit (HOSPITAL_COMMUNITY): Payer: 59

## 2019-07-25 ENCOUNTER — Encounter: Payer: Self-pay | Admitting: Pulmonary Disease

## 2019-07-25 ENCOUNTER — Ambulatory Visit (INDEPENDENT_AMBULATORY_CARE_PROVIDER_SITE_OTHER): Payer: 59 | Admitting: Pulmonary Disease

## 2019-07-25 ENCOUNTER — Other Ambulatory Visit: Payer: Self-pay

## 2019-07-25 ENCOUNTER — Telehealth: Payer: Self-pay | Admitting: Pulmonary Disease

## 2019-07-25 ENCOUNTER — Ambulatory Visit: Payer: 59 | Admitting: Pulmonary Disease

## 2019-07-25 VITALS — BP 98/60 | HR 85 | Temp 98.2°F | Wt 114.0 lb

## 2019-07-25 DIAGNOSIS — R911 Solitary pulmonary nodule: Secondary | ICD-10-CM

## 2019-07-25 DIAGNOSIS — F17210 Nicotine dependence, cigarettes, uncomplicated: Secondary | ICD-10-CM

## 2019-07-25 DIAGNOSIS — J449 Chronic obstructive pulmonary disease, unspecified: Secondary | ICD-10-CM | POA: Diagnosis not present

## 2019-07-25 DIAGNOSIS — F17200 Nicotine dependence, unspecified, uncomplicated: Secondary | ICD-10-CM | POA: Insufficient documentation

## 2019-07-25 DIAGNOSIS — G4733 Obstructive sleep apnea (adult) (pediatric): Secondary | ICD-10-CM | POA: Diagnosis not present

## 2019-07-25 DIAGNOSIS — Z79899 Other long term (current) drug therapy: Secondary | ICD-10-CM | POA: Insufficient documentation

## 2019-07-25 MED ORDER — ANORO ELLIPTA 62.5-25 MCG/INH IN AEPB: 1.0000 | INHALATION_SPRAY | Freq: Every day | RESPIRATORY_TRACT | 6 refills | Status: DC

## 2019-07-25 MED ORDER — ANORO ELLIPTA 62.5-25 MCG/INH IN AEPB: 1.0000 | INHALATION_SPRAY | Freq: Every day | RESPIRATORY_TRACT | 0 refills | Status: DC

## 2019-07-25 NOTE — Telephone Encounter (Signed)
07/25/2019  PCC's,  This patient had a home sleep study ordered in April.  It does not look like this is ever been scheduled to be completed.  Patient reported to me today in clinic that she received a letter from her insurance that they will not cover home sleep study.  Can you provide some further information regarding this?  Sounds like she may have had a home sleep study done at another pulmonary office but she reports that the pulmonary team reported that no information was ever provided from that so she does not believe this was a successful study.  Patient was seen in April/2021 by Dr. Jenetta Downer and he recommended a home sleep study.  Wyn Quaker, FNP

## 2019-07-25 NOTE — Progress Notes (Signed)
PFT done today. 

## 2019-07-25 NOTE — Assessment & Plan Note (Signed)
Plan: Complete CT in October/2021

## 2019-07-25 NOTE — Progress Notes (Signed)
@Patient  ID: Ashley Mosley, female    DOB: 06-20-1960, 59 y.o.   MRN: [id number]  Chief Complaint  Patient presents with  . Follow-up    after PFT today    Referring provider: Elayne Guerin  HPI:  59 year old female current everyday smoker followed in our office for COPD  PMH: Anxiety, paranoid schizophrenic Smoker/ Smoking History: Current smoker.  Smoking 1ppd  70-pack-year smoking history. Maintenance:  Anoro Ellipta  Pt of: Dr. Jenetta Downer  07/25/2019  - Visit   59 year old female current everyday smoker followed in our office for chronic obstructive pulmonary disease as well as a history of lung nodules as well as obstructive sleep apnea.  At last office visit in April/2021 it was recommended that she remain on Anoro Ellipta.  Obtain pulmonary function testing as well as obtain a home sleep study CT scan was repeated to follow the lung nodule that was seen in November/2020.  Patient has completed a CT of chest in April/2021.  Was recommended CT follow-up in 6 months.  Patient reporting today she cannot afford Anoro Ellipta.  She still continues to smoke 0.5 to 1 packs/day.  Patient reports the Anoro Ellipta inhaler was a $50 co-pay.  She reports that she cannot afford this.  She did feel therapeutic benefit while taking Anoro Ellipta.  Patient has not yet completed a home sleep study that was ordered in April/2021.  Patient reports that she received notification from her insurance that it will not be covered.  Questionaires / Pulmonary Flowsheets:   ACT:  No flowsheet data found.  MMRC: mMRC Dyspnea Scale mMRC Score  07/25/2019 0    Epworth:  Results of the Epworth flowsheet 04/29/2019  Sitting and reading 3  Watching TV 2  Sitting, inactive in a public place (e.g. a theatre or a meeting) 0  As a passenger in a car for an hour without a break 0  Lying down to rest in the afternoon when circumstances permit 0  Sitting and talking to someone 0  Sitting quietly after a lunch  without alcohol 3  In a car, while stopped for a few minutes in traffic 0  Total score 8    Tests:   05/09/2019-CT chest without contrast-no focal abnormality seen in the left subscapular region, 7 mm groundglass nodule in the superior segment right lower lobe, initial follow-up with CT at 6 to 12 months is recommended to confirm persistence, if persistent repeat CT is recommended every 2 years until 5 years of stability has been established, diffuse airway thickening and scattered secretions with some mosaic continuation reflecting underlying air trapping or small airways disease, emphysema, aortic arthrosclerosis  07/25/2019-pulmonary function test-FVC 2.59 (76% predicted), postbronchodilator ratio 65, postbronchodilator FEV1 1.65 (62% predicted), no bronchodilator response, DLCO 11.52 (56% predicted, TLC 5.37 (106% predicted)  FENO:  No results found for: NITRICOXIDE  PFT: PFT Results Latest Ref Rng & Units 07/25/2019  FVC-Pre L 2.59  FVC-Predicted Pre % 76  FVC-Post L 2.55  FVC-Predicted Post % 75  Pre FEV1/FVC % % 65  Post FEV1/FCV % % 65  FEV1-Pre L 1.67  FEV1-Predicted Pre % 63  FEV1-Post L 1.65  DLCO UNC% % 56  DLCO COR %Predicted % 58  TLC L 5.37  TLC % Predicted % 106  RV % Predicted % 141    WALK:  SIX MIN WALK 07/25/2019  Supplimental Oxygen during Test? (L/min) No  Tech Comments: pt tolerated the 3 lap walk without SOB or wheezing.  Imaging: No results found.  Lab Results:  CBC    Component Value Date/Time   WBC 12.3 (H) 02/15/2017 1423   WBC 16.8 (H) 01/18/2015 1518   RBC 4.31 02/15/2017 1423   RBC 4.31 02/15/2017 1423   HGB 14.5 02/15/2017 1423   HGB 14.1 02/15/2015 1524   HCT 41.5 02/15/2017 1423   HCT 42.6 02/15/2015 1524   PLT 419 (H) 02/15/2017 1423   PLT 377 02/15/2015 1524   MCV 96.3 02/15/2017 1423   MCV 98 02/15/2015 1524   MCH 33.6 02/15/2017 1423   MCHC 34.9 02/15/2017 1423   RDW 12.2 02/15/2017 1423   RDW 12.2 02/15/2015 1524    LYMPHSABS 3.2 02/15/2017 1423   LYMPHSABS 2.8 02/15/2015 1524   MONOABS 0.7 02/15/2017 1423   EOSABS 0.3 02/15/2017 1423   EOSABS 0.3 02/15/2015 1524   BASOSABS 0.0 02/15/2017 1423   BASOSABS 0.0 02/15/2015 1524    BMET    Component Value Date/Time   NA 142 02/15/2017 1423   NA 141 11/24/2013 1323   K 3.7 02/15/2017 1423   K 3.9 11/24/2013 1323   CL 112 (H) 02/15/2017 1423   CL 97 (L) 11/24/2013 1323   CO2 23 02/15/2017 1423   CO2 26 11/24/2013 1323   GLUCOSE 126 (H) 02/15/2017 1423   GLUCOSE 174 (H) 11/24/2013 1323   BUN 13 02/15/2017 1423   BUN 10 11/24/2013 1323   CREATININE 0.80 02/15/2017 1423   CREATININE 0.75 01/18/2015 1518   CALCIUM 9.5 02/15/2017 1423   CALCIUM 9.0 11/24/2013 1323   GFRNONAA >90 12/17/2012 0910   GFRAA >90 12/17/2012 0910    BNP No results found for: BNP  ProBNP No results found for: PROBNP  Specialty Problems      Pulmonary Problems   COPD mixed type (HCC)   OSA (obstructive sleep apnea)   Pulmonary nodule      Allergies  Allergen Reactions  . Codeine Nausea Only    Immunization History  Administered Date(s) Administered  . PFIZER SARS-COV-2 Vaccination 03/24/2019, 04/14/2019  . Tdap 12/28/2017    Needs pneumovax23, recommended today, pt declined    Past Medical History:  Diagnosis Date  . Abnormal finding on mammography, microcalcification 06/17/2012  . 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  Marland Kitchen Abnormal uterine bleeding   . Acid reflux 02/05/2008   Overview:  Esophageal Reflux   . Anemia   . Anxiety   . B-complex deficiency 09/07/2006   Overview:  Vitamin B12 Deficiency   . Blood transfusion without reported diagnosis    age 32  . COPD with emphysema ([place])   . Depression   . Dizzy   . Dysmenorrhea   . Dyspnea on exertion   . Elevated hemoglobin A1c 2014  . Endometrial mass 11/07/2012  . Endometriosis   . Fibroid 2012, 2015, 2017      . Gastric ulcer 2014  . History of kidney stones     . Major depressive disorder, single episode, severe, specified as with psychotic behavior 07/22/2012  . Nervous breakdown 07/2013   [facility] in [place]  . PTSD (post-traumatic stress disorder) 2018  . PTSD (post-traumatic stress disorder)   . [facility] spotted fever 1970   hospitalized x 1 month, had a blood transfusion  . Thrombocytosis ([place]) 2014  . Uterine fibroid 11/07/2012    Tobacco History: Social History   Tobacco Use  Smoking Status Current Every Day Smoker  . Packs/day: 2.00  .  Years: 35.00  . Pack years: 70.00  . Types: Cigarettes  . Start date: 12/09/1978  Smokeless Tobacco Never Used  Tobacco Comment   smoking one pack a day now   Ready to quit: No Counseling given: Yes Comment: smoking one pack a day now  Smoking assessment and cessation counseling  Patient currently smoking: 0.5 - 1 ppd I have advised the patient to quit/stop smoking as soon as possible due to high risk for multiple medical problems.  It will also be very difficult for [place] to manage patient's  respiratory symptoms and status if we continue to expose her lungs to a known irritant.  We do not advise e-cigarettes as a form of stopping smoking.  Patient is willing to quit smoking. Has not set quit date.   I have advised the patient that we can assist and have options of nicotine replacement therapy, provided smoking cessation education today, provided smoking cessation counseling, and provided cessation resources.  Follow-up next office visit office visit for assessment of smoking cessation.    Smoking cessation counseling advised for: 5 min     Outpatient Encounter Medications as of 07/25/2019  Medication Sig  . aspirin 81 MG chewable tablet Chew 81 mg by mouth daily. Takes once to three times a week.  . Multiple Vitamins-Minerals (MULTIVITAL) CHEW Chew by mouth every morning.  . prazosin (MINIPRESS) 2 MG capsule Take 2 mg by mouth at bedtime.  Marland Kitchen umeclidinium-vilanterol  (ANORO ELLIPTA) 62.5-25 MCG/INH AEPB Inhale 1 puff into the lungs daily.  Marland Kitchen umeclidinium-vilanterol (ANORO ELLIPTA) 62.5-25 MCG/INH AEPB Inhale 1 puff into the lungs daily.   No facility-administered encounter medications on file as of 07/25/2019.     Review of Systems  Review of Systems  Constitutional: Positive for fatigue. Negative for activity change and fever.  HENT: Negative for sinus pressure, sinus pain and sore throat.   Respiratory: Positive for cough (productive bubbly / clear ), shortness of breath and wheezing.   Cardiovascular: Negative for chest pain and palpitations.  Gastrointestinal: Negative for diarrhea, nausea and vomiting.  Musculoskeletal: Negative for arthralgias.  Neurological: Negative for dizziness.  Psychiatric/Behavioral: Negative for sleep disturbance. The patient is not nervous/anxious.      Physical Exam  BP 98/60   Pulse 85   Temp 98.2 F (36.8 C) (Oral)   Wt 114 lb (51.7 kg)   LMP 01/29/2015 (Exact Date)   SpO2 96%   BMI 19.57 kg/m   Wt Readings from Last 5 Encounters:  07/25/19 114 lb (51.7 kg)  04/29/19 118 lb 6.4 oz (53.7 kg)  12/28/17 122 lb 9.6 oz (55.6 kg)  02/15/17 121 lb (54.9 kg)  10/20/16 118 lb (53.5 kg)    BMI Readings from Last 5 Encounters:  07/25/19 19.57 kg/m  04/29/19 20.32 kg/m  12/28/17 21.72 kg/m  02/15/17 20.77 kg/m  10/20/16 20.25 kg/m     Physical Exam Vitals and nursing note reviewed.  Constitutional:      General: She is not in acute distress.    Appearance: Normal appearance. She is normal weight.  HENT:     Head: Normocephalic and atraumatic.     Right Ear: External ear normal.     Left Ear: External ear normal.     Nose: Nose normal. No congestion.     Mouth/Throat:     Mouth: Mucous membranes are moist.     Pharynx: Oropharynx is clear.  Eyes:     Pupils: Pupils are equal, round, and reactive to light.  Cardiovascular:  Rate and Rhythm: Normal rate and regular rhythm.     Pulses:  Normal pulses.     Heart sounds: Normal heart sounds. No murmur heard.   Pulmonary:     Breath sounds: No decreased air movement. No decreased breath sounds, wheezing or rales.     Comments: Diminished breath sounds on exam Musculoskeletal:     Cervical back: Normal range of motion.  Skin:    General: Skin is warm and dry.     Capillary Refill: Capillary refill takes less than 2 seconds.  Neurological:     General: No focal deficit present.     Mental Status: She is alert and oriented to person, place, and time. Mental status is at baseline.     Gait: Gait (tolerated walk in office today ) normal.  Psychiatric:        Mood and Affect: Mood normal.        Behavior: Behavior normal.        Thought Content: Thought content normal.        Judgment: Judgment normal.       Assessment & Plan:   OSA (obstructive sleep apnea) Patient with history of obstructive sleep apnea concerns that patient may have moderate to severe obstructive sleep apnea  Patient needs to have a sleep study completed, patient is reporting that insurance would not cover this  Plan: We will discuss this with patient care coordinators Patient should complete home sleep study  COPD mixed type [facility]) Current smoker COPD Gold stage II Struggling with cost of maintenance inhalers Diffusion defect Walk today in office stable without any oxygen desaturations  Plan: Anoro Ellipta sample provided today Prescription of Anoro Ellipta sent to the pharmacy We will coordinate follow-up with the pharmacy team We will investigate other cost options with LAMA/LABA Walk today in office stable, no oxygen needed Recommended Pneumovax 23 today, patient declined Emphasized need to stop smoking    Medication management Plan: Continue Anoro Ellipta Prescription of Anoro Ellipta sent to the pharmacy today Anoro Ellipta sample provided today We will work with the pharmacy team to see if there is more cost effective  options for LABA/LAMA agent Patient to have appointment with pharmacy team to review inhaler cost, inhaler device use as well as smoking cessation  Pulmonary nodule Plan: Complete CT in October/2021  Smoker Plan: Emphasized need to stop smoking Patient willing to meet with pharmacy team to discuss smoking cessation    Return in about 3 months (around 10/25/2019), or if symptoms worsen or fail to improve, for Follow up with Dr. Ander Slade, Follow up with Wyn Quaker FNP-C.   Lauraine Rinne, NP 07/25/2019   This appointment required 45 minutes of patient care (this includes precharting, chart review, review of results, face-to-face care, etc.).

## 2019-07-25 NOTE — Assessment & Plan Note (Signed)
Patient with history of obstructive sleep apnea concerns that patient may have moderate to severe obstructive sleep apnea  Patient needs to have a sleep study completed, patient is reporting that insurance would not cover this  Plan: We will discuss this with patient care coordinators Patient should complete home sleep study

## 2019-07-25 NOTE — Telephone Encounter (Signed)
07/25/2019  Patient was seen in office today for a follow-up.  She completed pulmonary function testing.  At that office visit patient reported that she cannot afford Anoro Ellipta co-pay $50 a month.  Patient is wondering if there is a more affordable option in the LAMA/LABA category.  Can we run test claims to check this?  Patient is also interested in meeting with the pharmacy team to review medications, inhaler device usage, and help with access to medications if she needs to apply for patient assistance.  Patient is also a current smoker and is willing to meet with the pharmacy team to discuss smoking cessation. can we get the patient scheduled with the pharmacy team for follow-up?  Wyn Quaker FNP

## 2019-07-25 NOTE — Assessment & Plan Note (Signed)
Plan: Emphasized need to stop smoking Patient willing to meet with pharmacy team to discuss smoking cessation

## 2019-07-25 NOTE — Assessment & Plan Note (Signed)
Plan: Continue Anoro Ellipta Prescription of Anoro Ellipta sent to the pharmacy today Anoro Ellipta sample provided today We will work with the pharmacy team to see if there is more cost effective options for LABA/LAMA agent Patient to have appointment with pharmacy team to review inhaler cost, inhaler device use as well as smoking cessation

## 2019-07-25 NOTE — Assessment & Plan Note (Signed)
Current smoker COPD Gold stage II Struggling with cost of maintenance inhalers Diffusion defect Walk today in office stable without any oxygen desaturations  Plan: Anoro Ellipta sample provided today Prescription of Anoro Ellipta sent to the pharmacy We will coordinate follow-up with the pharmacy team We will investigate other cost options with LAMA/LABA Walk today in office stable, no oxygen needed Recommended Pneumovax 23 today, patient declined Emphasized need to stop smoking

## 2019-07-25 NOTE — Patient Instructions (Addendum)
You were seen today by Lauraine Rinne, NP  for:   1. COPD mixed type (HCC)  Anoro Ellipta  >>> Take 1 puff daily in the morning right when you wake up >>>Rinse your mouth out after use >>>This is a daily maintenance inhaler, NOT a rescue inhaler >>>Contact our office if you are having difficulties affording or obtaining this medication >>>It is important for you to be able to take this daily and not miss any doses   Only use your albuterol as a rescue medication to be used if you can't catch your breath by resting or doing a relaxed purse lip breathing pattern.  - The less you use it, the better it will work when you need it. - Ok to use up to 2 puffs  every 4 hours if you must but call for immediate appointment if use goes up over your usual need - Don't leave home without it !!  (think of it like the spare tire for your car)   Note your daily symptoms > remember "red flags" for COPD:   >>>Increase in cough >>>increase in sputum production >>>increase in shortness of breath or activity  intolerance.   If you notice these symptoms, please call the office to be seen.   We recommended the Pneumovax 23 which is a pneumonia vaccine for you today this is indicated given the fact that your current smoker as well as having COPD, you declined this  2. Pulmonary nodule  We will schedule a CT of your chest to follow the April/2021 CT chest to be completed in October/2021  3. Smoker  We recommend that you stop smoking.  >>>You need to set a quit date >>>If you have friends or family who smoke, let them know you are trying to quit and not to smoke around you or in your living environment  Smoking Cessation Resources:  1 800 QUIT NOW  >>> Patient to call this resource and utilize it to help support her quit smoking >>> Keep up your hard work with stopping smoking  You can also contact the [facility] >>>For smoking cessation classes call [phone]  We do not recommend  using e-cigarettes as a form of stopping smoking  You can sign up for smoking cessation support texts and information:  >>>https://smokefree.gov/smokefreetxt    4. OSA (obstructive sleep apnea)  We will investigate why the home sleep study has not yet been completed  5. Medication management  We will get you set up with a clinical pharmacy team visit with our office  We will provide her Anoro Ellipta sample today  We will send a prescription for Anoro Ellipta  We will also investigate with the pharmacy team if there is a more affordable option that is comparable to Anoro Ellipta for you   Follow Up:    Return in about 3 months (around 10/25/2019), or if symptoms worsen or fail to improve, for Follow up with Dr. Ander Slade, Follow up with Wyn Quaker FNP-C.   Please do your part to reduce the spread of COVID-19:      Reduce your risk of any infection  and COVID19 by using the similar precautions used for avoiding the common cold or flu:  Marland Kitchen Wash your hands often with soap and warm water for at least 20 seconds.  If soap and water are not readily available, use an alcohol-based hand sanitizer with at least 60% alcohol.  . If coughing or sneezing, cover your mouth and nose by  coughing or sneezing into the elbow areas of your shirt or coat, into a tissue or into your sleeve (not your hands). Langley Gauss A MASK when in public  . Avoid shaking hands with others and consider head nods or verbal greetings only. . Avoid touching your eyes, nose, or mouth with unwashed hands.  . Avoid close contact with people who are sick. . Avoid places or events with large numbers of people in one location, like concerts or sporting events. . If you have some symptoms but not all symptoms, continue to monitor at home and seek medical attention if your symptoms worsen. . If you are having a medical emergency, call 911.   [place] / e-Visit:  [email or web address]         [facility]: [facility]: [phone]                   [facility]: [phone]     It is flu season:   >>> Best ways to protect herself from the flu: Receive the yearly flu vaccine, practice good hand hygiene washing with soap and also using hand sanitizer when available, eat a nutritious meals, get adequate rest, hydrate appropriately   Please contact the office if your symptoms worsen or you have concerns that you are not improving.   Thank you for choosing [facility] for your healthcare, and for allowing [place] to partner with you on your healthcare journey. I am thankful to be able to provide care to you today.   Wyn Quaker FNP-C    Chronic Obstructive Pulmonary Disease Chronic obstructive pulmonary disease (COPD) is a long-term (chronic) lung problem. When you have COPD, it is hard for air to get in and out of your lungs. Usually the condition gets worse over time, and your lungs will never return to normal. There are things you can do to keep yourself as healthy as possible.  Your doctor may treat your condition with: ? Medicines. ? Oxygen. ? Lung surgery.  Your doctor may also recommend: ? Rehabilitation. This includes steps to make your body work better. It may involve a team of specialists. ? Quitting smoking, if you smoke. ? Exercise and changes to your diet. ? Comfort measures (palliative care). Follow these instructions at home: Medicines  Take over-the-counter and prescription medicines only as told by your doctor.  Talk to your doctor before taking any cough or allergy medicines. You may need to avoid medicines that cause your lungs to be dry. Lifestyle  If you smoke, stop. Smoking makes the problem worse. If you need help quitting, ask your doctor.  Avoid being around things that make your breathing worse. This may include  smoke, chemicals, and fumes.  Stay active, but remember to rest as well.  Learn and use tips on how to relax.  Make sure you get enough sleep. Most adults need at least 7 hours of sleep every night.  Eat healthy foods. Eat smaller meals more often. Rest before meals. Controlled breathing Learn and use tips on how to control your breathing as told by your doctor. Try:  Breathing in (inhaling) through your nose for 1 second. Then, pucker your lips and breath out (exhale) through your lips for 2 seconds.  Putting one hand on your belly (abdomen). Breathe in slowly through your nose for 1 second. Your hand on your belly should move out. Pucker your lips and breathe  out slowly through your lips. Your hand on your belly should move in as you breathe out.  Controlled coughing Learn and use controlled coughing to clear mucus from your lungs. Follow these steps: 1. Lean your head a little forward. 2. Breathe in deeply. 3. Try to hold your breath for 3 seconds. 4. Keep your mouth slightly open while coughing 2 times. 5. Spit any mucus out into a tissue. 6. Rest and do the steps again 1 or 2 times as needed. General instructions  Make sure you get all the shots (vaccines) that your doctor recommends. Ask your doctor about a flu shot and a pneumonia shot.  Use oxygen therapy and pulmonary rehabilitation if told by your doctor. If you need home oxygen therapy, ask your doctor if you should buy a tool to measure your oxygen level (oximeter).  Make a COPD action plan with your doctor. This helps you to know what to do if you feel worse than usual.  Manage any other conditions you have as told by your doctor.  Avoid going outside when it is very hot, cold, or humid.  Avoid people who have a sickness you can catch (contagious).  Keep all follow-up visits as told by your doctor. This is important. Contact a doctor if:  You cough up more mucus than usual.  There is a change in the color or  thickness of the mucus.  It is harder to breathe than usual.  Your breathing is faster than usual.  You have trouble sleeping.  You need to use your medicines more often than usual.  You have trouble doing your normal activities such as getting dressed or walking around the house. Get help right away if:  You have shortness of breath while resting.  You have shortness of breath that stops you from: ? Being able to talk. ? Doing normal activities.  Your chest hurts for longer than 5 minutes.  Your skin color is more blue than usual.  Your pulse oximeter shows that you have low oxygen for longer than 5 minutes.  You have a fever.  You feel too tired to breathe normally. Summary  Chronic obstructive pulmonary disease (COPD) is a long-term lung problem.  The way your lungs work will never return to normal. Usually the condition gets worse over time. There are things you can do to keep yourself as healthy as possible.  Take over-the-counter and prescription medicines only as told by your doctor.  If you smoke, stop. Smoking makes the problem worse. This information is not intended to replace advice given to you by your health care provider. Make sure you discuss any questions you have with your health care provider. Document Revised: 12/08/2016 Document Reviewed: 01/31/2016 Elsevier Patient Education  2020 [place] Risks of Smoking Smoking cigarettes is very bad for your health. Tobacco smoke has over 200 known poisons in it. It contains the poisonous gases nitrogen oxide and carbon monoxide. There are over 60 chemicals in tobacco smoke that cause cancer. Smoking is difficult to quit because a chemical in tobacco, called nicotine, causes addiction or dependence. When you smoke and inhale, nicotine is absorbed rapidly into the bloodstream through your lungs. Both inhaled and non-inhaled nicotine may be addictive. What are the risks of cigarette  smoke? Cigarette smokers have an increased risk of many serious medical problems, including:  Lung cancer.  Lung disease, such as pneumonia, bronchitis, and emphysema.  Chest pain (angina) and heart attack because the heart  is not getting enough oxygen.  Heart disease and peripheral blood vessel disease.  High blood pressure (hypertension).  Stroke.  Oral cancer, including cancer of the lip, mouth, or voice box.  Bladder cancer.  Pancreatic cancer.  Cervical cancer.  Pregnancy complications, including premature birth.  Stillbirths and smaller newborn babies, birth defects, and genetic damage to sperm.  Early menopause.  Lower estrogen level for women.  Infertility.  Facial wrinkles.  Blindness.  Increased risk of broken bones (fractures).  Senile dementia.  Stomach ulcers and internal bleeding.  Delayed wound healing and increased risk of complications during surgery.  Even smoking lightly shortens your life expectancy by several years. Because of secondhand smoke exposure, children of smokers have an increased risk of the following:  Sudden infant death syndrome (SIDS).  Respiratory infections.  Lung cancer.  Heart disease.  Ear infections. What are the benefits of quitting? There are many health benefits of quitting smoking. Here are some of them:  Within days of quitting smoking, your risk of having a heart attack decreases, your blood flow improves, and your lung capacity improves. Blood pressure, pulse rate, and breathing patterns start returning to normal soon after quitting.  Within months, your lungs may clear up completely.  Quitting for 10 years reduces your risk of developing lung cancer and heart disease to almost that of a nonsmoker.  People who quit may see an improvement in their overall quality of life. How do I quit smoking?     Smoking is an addiction with both physical and psychological effects, and longtime habits can be hard  to change. Your health care provider can recommend:  Programs and community resources, which may include group support, education, or talk therapy.  Prescription medicines to help reduce cravings.  Nicotine replacement products, such as patches, gum, and nasal sprays. Use these products only as directed. Do not replace cigarette smoking with electronic cigarettes, which are commonly called e-cigarettes. The safety of e-cigarettes is not known, and some may contain harmful chemicals.  A combination of two or more of these methods. Where to find more information  American Lung Association: www.lung.org  American Cancer Society: www.cancer.org Summary  Smoking cigarettes is very bad for your health. Cigarette smokers have an increased risk of many serious medical problems, including several cancers, heart disease, and stroke.  Smoking is an addiction with both physical and psychological effects, and longtime habits can be hard to change.  By stopping right away, you can greatly reduce the risk of medical problems for you and your family.  To help you quit smoking, your health care provider can recommend programs, community resources, prescription medicines, and nicotine replacement products such as patches, gum, and nasal sprays. This information is not intended to replace advice given to you by your health care provider. Make sure you discuss any questions you have with your health care provider. Document Revised: 03/29/2017 Document Reviewed: 12/31/2015 Elsevier Patient Education  2020 [employer].

## 2019-07-28 NOTE — Telephone Encounter (Signed)
Thank you.  Appreciate you all looking into this and offering to contact the patient again.Ashley Mosley

## 2019-07-28 NOTE — Telephone Encounter (Signed)
LM for pt to schedule HST & mailed letter for pt to schedule HST.

## 2019-07-28 NOTE — Telephone Encounter (Signed)
We received a prior auth for a HST for this patient, however, pt didn't answer calls made to schedule the appointment or respond to the letter we sent to schedule this.  We will try to reach out to the patient again.

## 2019-07-28 NOTE — Telephone Encounter (Signed)
LMTCB x 1 

## 2019-07-28 NOTE — Telephone Encounter (Signed)
Enrolled patient for an Anoro copay card. Copay will now be zero. Called and provided info to [facility], they will fill medication. Called patient, left message to advise.  To help you save on ANORO, we've included this coupon. Use it to pay as little as $0 a month for up to 12 months*. BIN#: [id number] PCN#: [id number] GRP#: [id number] ID#: [id number] Offer Expires: 01/09/2020 *For eligible commercially insured patients. Maximum savings of $190 per month between January 10, 2019 and April 09, 2019; and $150 per month at all other times.  Please schedule patient with pharmacy team for smoking cessation/ inhaler training.

## 2019-07-28 NOTE — Telephone Encounter (Signed)
Thank you for your work.Wyn Quaker, FNP

## 2019-07-31 ENCOUNTER — Other Ambulatory Visit: Payer: Self-pay | Admitting: Obstetrics and Gynecology

## 2019-07-31 DIAGNOSIS — Z1231 Encounter for screening mammogram for malignant neoplasm of breast: Secondary | ICD-10-CM

## 2019-08-12 ENCOUNTER — Ambulatory Visit
Admission: RE | Admit: 2019-08-12 | Discharge: 2019-08-12 | Disposition: A | Discharge status: Home / Self Care | Payer: 59 | Source: Ambulatory Visit | Attending: Obstetrics and Gynecology | Admitting: Obstetrics and Gynecology

## 2019-08-12 ENCOUNTER — Other Ambulatory Visit: Payer: Self-pay

## 2019-08-12 ENCOUNTER — Telehealth: Payer: Self-pay | Admitting: Pulmonary Disease

## 2019-08-12 ENCOUNTER — Other Ambulatory Visit: Payer: Self-pay | Admitting: Obstetrics and Gynecology

## 2019-08-12 DIAGNOSIS — Z1231 Encounter for screening mammogram for malignant neoplasm of breast: Secondary | ICD-10-CM

## 2019-08-12 DIAGNOSIS — N6459 Other signs and symptoms in breast: Secondary | ICD-10-CM

## 2019-08-12 NOTE — Telephone Encounter (Signed)
Pt was informed the office that scheduled mammogram would be contacting her regarding scan appointments Informed   most recent CT was compared to 2015 CT. Pt stated she had CTs preformed at [facility] which can be seen in [place].  Aaron Edelman please advise.  Pt aware Aaron Edelman is not in office at this time.  Hold in triage until Aaron Edelman returns on 8/5 so this can be routed to him at that time.

## 2019-08-12 NOTE — Telephone Encounter (Signed)
Pt called back to add:  She sees her CT report is referenced to an older CT that was a written report rather than actual images. Pt spoke to the doctor about that, and was told they would check into it but has yet to hear back about it.

## 2019-08-13 ENCOUNTER — Telehealth: Payer: Self-pay | Admitting: Obstetrics and Gynecology

## 2019-08-13 NOTE — Telephone Encounter (Signed)
Lease reach out to patient to schedule an office visit for an annual exam with me.   I received an order for a dx mammogram for nipple inversion.   I have not seen the patient for a while, and I need to be able to assess her.   Thank you.

## 2019-08-13 NOTE — Telephone Encounter (Signed)
Unsure what the patient is referencing her asking.  As I explained to the patient in the office visit just because they are in care everywhere does not mean that the radiologist at that time has access to those images for comparison.    I explained to the patient that if she is receives the next CT within the [facility] health system they can be able to compare those images appropriately.  Please make sure the patient is scheduled for follow-up with Dr. Jenetta Downer.  Also please see the other messages were regarding the patient's Anoro Ellipta prescription which we have been trying to help get her access to.  And they have been unsuccessful in reaching the patient.  Wyn Quaker FNP

## 2019-08-13 NOTE — Telephone Encounter (Signed)
Left message for pt to return call to triage RN. 

## 2019-08-14 NOTE — Telephone Encounter (Signed)
ATC, left VM x 1 requesting a call back to schedule f/u appointment with Dr. Ander Slade and to answer question regarding CT scan comparison.

## 2019-08-14 NOTE — Telephone Encounter (Signed)
AEX 12/2017 Tubal ligation-essure  No personal or family Hx of breast cancer  Spoke with pt. Pt states having nipple changes on right side over the past year. Pt denies any nipple discharge or redness, fever or chills. Pt states "feels like nipple disappeared on right side" Pt denies pain or tenderness or feeling any lumps. Pt states " both breast look weird" Pt also states having a few CT scans due to findings of 3-4 lung nodules. See epic notes.   Pt states has not gone anywhere due to Covid. Pt states has been vaccinated and is feeling better for OV.  Pt given recommendations for OV by Dr Quincy Simmonds. Pt agreeable. Pt scheduled for OV on 8/13 at 230 pm and also scheduled AEX on 09/25/19 at 4 pm. Pt agreeable and verbalized understanding to date and time of appt.  Advised will give update to Dr Quincy Simmonds. Pt agreeable.   Routing to Dr Quincy Simmonds for review.  Encounter closed.

## 2019-08-15 NOTE — Telephone Encounter (Signed)
ATC pt X2 LMTCB

## 2019-08-19 NOTE — Telephone Encounter (Signed)
LMTCB x3 for pt. We have attempted to contact pt several times with no success or call back from pt. Per triage protocol, message will be closed.   

## 2019-08-20 ENCOUNTER — Telehealth: Payer: Self-pay | Admitting: Pulmonary Disease

## 2019-08-20 NOTE — Telephone Encounter (Signed)
Lauraine Rinne, NP to [facility] Triage Pool     10:04 PM Note Unsure what the patient is referencing her asking.  As I explained to the patient in the office visit just because they are in care everywhere does not mean that the radiologist at that time has access to those images for comparison.    I explained to the patient that if she is receives the next CT within the [facility] health system they can be able to compare those images appropriately.  Please make sure the patient is scheduled for follow-up with Dr. Jenetta Downer.  Also please see the other messages were regarding the patient's Anoro Ellipta prescription which we have been trying to help get her access to.  And they have been unsuccessful in reaching the patient.  Wyn Quaker FNP      I made pt aware of Brian's response  She verbalized understanding  She states taking anoro but it's causing severe cramps in her legs "charlie horse" I scheduled her with Dr Ander Slade for 09/02/19 to discuss

## 2019-08-20 NOTE — Progress Notes (Signed)
GYNECOLOGY  VISIT   HPI: 59 y.o.   Married  Caucasian  female   [phone] with Patient's last menstrual period was 01/29/2015 (exact date).   here for nipple/breast change in both breasts.     She states she does not have nipple protrusion.  No inversion of nipples.   She states her partner found a change in her left breast.   Not taking any hormone treatment.   No family history of breast cancer.   Completed her Covid vaccine in April, 2021.   Asking about Essure removal. Feels like she has been sick since the Essure was placed.   Dryness and bleeding with sex.   GYNECOLOGIC HISTORY: Patient's last menstrual period was 01/29/2015 (exact date). Contraception:  Tubal ligation--essure Menopausal hormone therapy:  none Last mammogram:  06-28-2018 bilateral & left breast u/s category c density birads 2:neg Last pap smear:   12-28-17 neg HPV HR neg        OB History    Gravida  5   Para  3   Term  2   Preterm  1   AB  2   Living  3     SAB      TAB  2   Ectopic      Multiple      Live Births  2              Patient Active Problem List   Diagnosis Date Noted  . COPD mixed type ([place]) 07/25/2019  . Smoker 07/25/2019  . OSA (obstructive sleep apnea) 07/25/2019  . Pulmonary nodule 07/25/2019  . Medication management 07/25/2019  . Elevated hemoglobin A1c 12/11/2012  . Thrombocytosis ([place]) 12/11/2012  . Uterine fibroid 11/07/2012  . Endometrial mass 11/07/2012  . Anxiety state, unspecified 07/22/2012  . Paranoid schizophrenia ([place]) 07/22/2012  . Major depressive disorder, single episode, severe, specified as with psychotic behavior 07/22/2012  . Abnormal finding on mammography, microcalcification 06/17/2012  . Acid reflux 02/05/2008  . B-complex deficiency 09/07/2006    Past Medical History:  Diagnosis Date  . Abnormal finding on mammography, microcalcification 06/17/2012  . 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  Marland Kitchen Abnormal  uterine bleeding   . Acid reflux 02/05/2008   Overview:  Esophageal Reflux   . Anemia   . Anxiety   . B-complex deficiency 09/07/2006   Overview:  Vitamin B12 Deficiency   . Blood transfusion without reported diagnosis    age 43  . COPD with emphysema ([place])   . Depression   . Dizzy   . Dysmenorrhea   . Dyspnea on exertion   . Elevated hemoglobin A1c 2014  . Endometrial mass 11/07/2012  . Endometriosis   . Fibroid 2012, 2015, 2017      . Gastric ulcer 2014  . History of kidney stones   . Major depressive disorder, single episode, severe, specified as with psychotic behavior 07/22/2012  . Nervous breakdown 07/2013   [facility] in [place]  . PTSD (post-traumatic stress disorder) 2018  . PTSD (post-traumatic stress disorder)   . [place] spotted fever 1970   hospitalized x 1 month, had a blood transfusion  . Thrombocytosis ([place]) 2014  . Uterine fibroid 11/07/2012    Past Surgical History:  Procedure Laterality Date  . CESAREAN SECTION  1993  . COLPOSCOPY  2010  . ESSURE TUBAL LIGATION  2010  . HYSTEROSCOPY WITH D & C N/A 12/17/2012   Procedure: DILATATION AND CURETTAGE /  HYSTEROSCOPY AND RESECTOSCOPE;  Surgeon: Jamey Reas de Berton Lan, MD;  Location: [facility];  Service: Gynecology;  Laterality: N/A;  . PELVIC LAPAROSCOPY    . TUBAL LIGATION     Essure    Current Outpatient Medications  Medication Sig Dispense Refill  . aspirin 81 MG chewable tablet Chew 81 mg by mouth daily. Takes once to three times a week.    . Multiple Vitamins-Minerals (MULTIVITAL) CHEW Chew by mouth every morning.    . umeclidinium-vilanterol (ANORO ELLIPTA) 62.5-25 MCG/INH AEPB Inhale 1 puff into the lungs daily. 1 each 0  . prazosin (MINIPRESS) 2 MG capsule Take 2 mg by mouth at bedtime. (Patient not taking: Reported on 08/22/2019)     No current facility-administered medications for this visit.     ALLERGIES: Codeine  Family History  Problem Relation Age of Onset  .  Dementia Mother   . Diabetes Father   . Heart disease Father     Social History   Socioeconomic History  . Marital status: Married    Spouse name: Not on file  . Number of children: Not on file  . Years of education: Not on file  . Highest education level: Not on file  Occupational History  . Not on file  Tobacco Use  . Smoking status: Current Every Day Smoker    Packs/day: 0.50    Years: 35.00    Pack years: 17.50    Types: Cigarettes    Start date: 12/09/1978  . Smokeless tobacco: Never Used  . Tobacco comment: smoking one pack a day now  Vaping Use  . Vaping Use: Never used  Substance and Sexual Activity  . Alcohol use: No    Alcohol/week: 0.0 standard drinks  . Drug use: No  . Sexual activity: Yes    Partners: Male    Birth control/protection: Other-see comments    Comment: Essure  Other Topics Concern  . Not on file  Social History Narrative  . Not on file   Social Determinants of Health   Financial Resource Strain:   . Difficulty of Paying Living Expenses:   Food Insecurity:   . Worried About Charity fundraiser in the Last Year:   . Arboriculturist in the Last Year:   Transportation Needs:   . Film/video editor (Medical):   Marland Kitchen Lack of Transportation (Non-Medical):   Physical Activity:   . Days of Exercise per Week:   . Minutes of Exercise per Session:   Stress:   . Feeling of Stress :   Social Connections:   . Frequency of Communication with Friends and Family:   . Frequency of Social Gatherings with Friends and Family:   . Attends Religious Services:   . Active Member of Clubs or Organizations:   . Attends Archivist Meetings:   Marland Kitchen Marital Status:   Intimate Partner Violence:   . Fear of Current or Ex-Partner:   . Emotionally Abused:   Marland Kitchen Physically Abused:   . Sexually Abused:     Review of Systems  Constitutional: Negative.   HENT: Negative.   Eyes: Negative.   Respiratory: Negative.   Cardiovascular: Negative.    Gastrointestinal: Negative.   Endocrine: Negative.   Genitourinary: Negative.   Musculoskeletal: Negative.   Skin: Negative.   Allergic/Immunologic: Negative.   Neurological: Negative.   Hematological: Negative.   Psychiatric/Behavioral: Negative.     PHYSICAL EXAMINATION:    BP 112/60 (BP Location: Left Arm, Patient Position:  Sitting, Cuff Size: Normal)   Pulse 84   Resp 14   Ht 5\' 4"  (1.626 m)   Wt 116 lb (52.6 kg)   LMP 01/29/2015 (Exact Date)   BMI 19.91 kg/m     General appearance: alert, cooperative and appears stated age   Breasts: normal appearance, no masses or tenderness, No nipple retraction or dimpling on left, nipple retraction on the right, No nipple discharge or bleeding, No axillary or supraclavicular adenopathy   Chaperone was present for exam.  ASSESSMENT  Right nipple retraction.  Essure.   PLAN  Will proceed with bilateral dx mammogram and right breast US.  I explained to her that Essure can be removed if she desires and mentioned a provider in [place].  Return for annual exam and prn.

## 2019-08-21 NOTE — Telephone Encounter (Signed)
Aware. 

## 2019-08-22 ENCOUNTER — Telehealth: Payer: Self-pay | Admitting: Obstetrics and Gynecology

## 2019-08-22 ENCOUNTER — Ambulatory Visit (INDEPENDENT_AMBULATORY_CARE_PROVIDER_SITE_OTHER): Payer: 59 | Admitting: Obstetrics and Gynecology

## 2019-08-22 ENCOUNTER — Encounter: Payer: Self-pay | Admitting: Obstetrics and Gynecology

## 2019-08-22 ENCOUNTER — Other Ambulatory Visit: Payer: Self-pay

## 2019-08-22 VITALS — BP 112/60 | HR 84 | Resp 14 | Ht 64.0 in | Wt 116.0 lb

## 2019-08-22 DIAGNOSIS — N6459 Other signs and symptoms in breast: Secondary | ICD-10-CM

## 2019-08-22 NOTE — Telephone Encounter (Signed)
[facility] contact [facility] to clarify orders and schedule patient for bilateral dx mammogram and right breast ultrasound.   She has slight right nipple inversion.

## 2019-08-22 NOTE — Telephone Encounter (Signed)
Call placed to [facility] for clarification on orders and to schedule pt for dx MMG and right [place].   Spoke with Lilia Pro, pt scheduled for 09/04/19 at 840 am. Will call pt and update on appt. Orders cancelled for left breast US.  Call placed to pt. Pt updated on appt. Pt states appt needs to be in afternoon after 2pm. Pt to return call to [facility] today and reschedule. Pt agreeable and verbalized understanding.  Routing to Dr Quincy Simmonds for review.  Encounter closed.

## 2019-09-02 ENCOUNTER — Ambulatory Visit: Payer: 59 | Admitting: Pulmonary Disease

## 2019-09-02 ENCOUNTER — Other Ambulatory Visit: Payer: Self-pay

## 2019-09-02 ENCOUNTER — Encounter: Payer: Self-pay | Admitting: Pulmonary Disease

## 2019-09-02 VITALS — BP 106/60 | HR 80 | Temp 97.7°F | Ht 64.0 in | Wt 114.8 lb

## 2019-09-02 DIAGNOSIS — R911 Solitary pulmonary nodule: Secondary | ICD-10-CM

## 2019-09-02 DIAGNOSIS — J449 Chronic obstructive pulmonary disease, unspecified: Secondary | ICD-10-CM

## 2019-09-02 NOTE — Progress Notes (Signed)
Ashley Mosley    [id number]    11-08-1960  Primary Care Physician:Long, Genevie Cheshire  Referring Physician: Blair Heys, PA-C [address] [place],  [place] [address]  Chief complaint:   Patient with shortness of breath  HPI:  History of obstructive lung disease  Has cut down smoking Had a CT scan showing lung nodules, emphysema  PFT with moderate obstructive disease  She is able to stay active Uses Anoro, rescue inhaler use  The last CT scan in November 2020 did reveal a new nodule in the right upper lobe She is known to have emphysema  An active smoker, pack a day smoker, 40-pack-year smoking history -Not likely to quit -She states she has cut down significantly  Did have hemoptysis in the past that self resolved   Outpatient Encounter Medications as of 09/02/2019  Medication Sig  . aspirin 81 MG chewable tablet Chew 81 mg by mouth daily. Takes once to three times a week.  . Multiple Vitamins-Minerals (MULTIVITAL) CHEW Chew by mouth every morning.  . prazosin (MINIPRESS) 2 MG capsule Take 2 mg by mouth at bedtime.   Marland Kitchen umeclidinium-vilanterol (ANORO ELLIPTA) 62.5-25 MCG/INH AEPB Inhale 1 puff into the lungs daily.   No facility-administered encounter medications on file as of 09/02/2019.    Allergies as of 09/02/2019 - Review Complete 08/22/2019  Allergen Reaction Noted  . Codeine Nausea Only 12/09/2012    Past Medical History:  Diagnosis Date  . Abnormal finding on mammography, microcalcification 06/17/2012  . 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  Marland Kitchen Abnormal uterine bleeding   . Acid reflux 02/05/2008   Overview:  Esophageal Reflux   . Anemia   . Anxiety   . B-complex deficiency 09/07/2006   Overview:  Vitamin B12 Deficiency   . Blood transfusion without reported diagnosis    age 59  . COPD with emphysema ([place])   . Depression   . Dizzy   . Dysmenorrhea   . Dyspnea on exertion   . Elevated hemoglobin A1c 2014   . Endometrial mass 11/07/2012  . Endometriosis   . Fibroid 2012, 2015, 2017      . Gastric ulcer 2014  . History of kidney stones   . Major depressive disorder, single episode, severe, specified as with psychotic behavior 07/22/2012  . Nervous breakdown 07/2013   [facility] in [place]  . PTSD (post-traumatic stress disorder) 2018  . PTSD (post-traumatic stress disorder)   . [place] spotted fever 1970   hospitalized x 1 month, had a blood transfusion  . Thrombocytosis ([place]) 2014  . Uterine fibroid 11/07/2012    Past Surgical History:  Procedure Laterality Date  . CESAREAN SECTION  1993  . COLPOSCOPY  2010  . ESSURE TUBAL LIGATION  2010  . HYSTEROSCOPY WITH D & C N/A 12/17/2012   Procedure: DILATATION AND CURETTAGE /HYSTEROSCOPY AND RESECTOSCOPE;  Surgeon: Jamey Reas de Berton Lan, MD;  Location: [facility];  Service: Gynecology;  Laterality: N/A;  . PELVIC LAPAROSCOPY    . TUBAL LIGATION     Essure    Family History  Problem Relation Age of Onset  . Dementia Mother   . Diabetes Father   . Heart disease Father     Social History   Socioeconomic History  . Marital status: Married    Spouse name: Not on file  . Number of children: Not on file  .  Years of education: Not on file  . Highest education level: Not on file  Occupational History  . Not on file  Tobacco Use  . Smoking status: Current Every Day Smoker    Packs/day: 0.50    Years: 35.00    Pack years: 17.50    Types: Cigarettes    Start date: 12/09/1978  . Smokeless tobacco: Never Used  . Tobacco comment: smoking one pack a day now  Vaping Use  . Vaping Use: Never used  Substance and Sexual Activity  . Alcohol use: No    Alcohol/week: 0.0 standard drinks  . Drug use: No  . Sexual activity: Yes    Partners: Male    Birth control/protection: Other-see comments    Comment: Essure  Other Topics Concern  . Not on file  Social History Narrative  . Not on file   Social  Determinants of Health   Financial Resource Strain:   . Difficulty of Paying Living Expenses: Not on file  Food Insecurity:   . Worried About Charity fundraiser in the Last Year: Not on file  . Ran Out of Food in the Last Year: Not on file  Transportation Needs:   . Lack of Transportation (Medical): Not on file  . Lack of Transportation (Non-Medical): Not on file  Physical Activity:   . Days of Exercise per Week: Not on file  . Minutes of Exercise per Session: Not on file  Stress:   . Feeling of Stress : Not on file  Social Connections:   . Frequency of Communication with Friends and Family: Not on file  . Frequency of Social Gatherings with Friends and Family: Not on file  . Attends Religious Services: Not on file  . Active Member of Clubs or Organizations: Not on file  . Attends Archivist Meetings: Not on file  . Marital Status: Not on file  Intimate Partner Violence:   . Fear of Current or Ex-Partner: Not on file  . Emotionally Abused: Not on file  . Physically Abused: Not on file  . Sexually Abused: Not on file    Review of Systems  Constitutional: Negative.   Respiratory: Negative for shortness of breath.   Cardiovascular: Negative.  Negative for chest pain.  Psychiatric/Behavioral: Negative.     Vitals:   09/02/19 1032  BP: 106/60  Pulse: 80  Temp: 97.7 F (36.5 C)  SpO2: 96%    Physical Exam Constitutional:      Appearance: Normal appearance. She is well-developed.  HENT:     Head: Normocephalic and atraumatic.  Eyes:     General:        Right eye: No discharge.        Left eye: No discharge.     Conjunctiva/sclera: Conjunctivae normal.     Pupils: Pupils are equal, round, and reactive to light.  Neck:     Thyroid: No thyromegaly.     Trachea: No tracheal deviation.  Cardiovascular:     Rate and Rhythm: Normal rate and regular rhythm.  Pulmonary:     Effort: Pulmonary effort is normal. No respiratory distress.     Breath sounds: Normal  breath sounds. No wheezing or rales.  Musculoskeletal:     Cervical back: Normal range of motion and neck supple.  Neurological:     Mental Status: She is alert.     Data Reviewed: Previous CAT scan report from 2020 from [facility] reviewed, multiple lung nodules with emphysema  Most recent CT  scan of the chest was reviewed with the patient performed 05/09/2019 showing multiple nodules  PFT with moderate obstructive disease reviewed with the patient  Assessment:  Chronic obstructive pulmonary disease  -She is using Anoro and albuterol -Continue inhalers  Lung nodules -Repeat CT still shows multiple nodules, stable from report although actual films not been compared   Plan/Recommendations: We will repeat CT scan in about 3 months to make it about 6 months since the last 1  Smoking cessation counseling   We will follow in 6 months  Encouraged to call with any significant concerns  Sherrilyn Rist MD [facility] 09/02/2019, 10:44 AM  CC: Blair Heys, PA-C

## 2019-09-02 NOTE — Addendum Note (Signed)
Addended by: Tery Sanfilippo R on: 09/02/2019 11:16 AM   Modules accepted: Orders

## 2019-09-02 NOTE — Patient Instructions (Signed)
Continue Anoro Continue rescue inhaler use as needed  Repeat CT scan of the chest to follow-up on lung nodules in about 3 months  Follow-up in 6 months  Call with significant concerns

## 2019-09-04 ENCOUNTER — Ambulatory Visit
Admission: RE | Admit: 2019-09-04 | Discharge: 2019-09-04 | Disposition: A | Discharge status: Home / Self Care | Payer: 59 | Source: Ambulatory Visit | Attending: Obstetrics and Gynecology | Admitting: Obstetrics and Gynecology

## 2019-09-04 ENCOUNTER — Other Ambulatory Visit: Payer: Self-pay

## 2019-09-04 DIAGNOSIS — N6459 Other signs and symptoms in breast: Secondary | ICD-10-CM

## 2019-09-12 ENCOUNTER — Other Ambulatory Visit: Payer: Self-pay

## 2019-09-12 ENCOUNTER — Ambulatory Visit (HOSPITAL_BASED_OUTPATIENT_CLINIC_OR_DEPARTMENT_OTHER)
Admission: RE | Admit: 2019-09-12 | Discharge: 2019-09-12 | Disposition: A | Discharge status: Home / Self Care | Payer: 59 | Source: Ambulatory Visit | Attending: Pulmonary Disease | Admitting: Pulmonary Disease

## 2019-09-12 DIAGNOSIS — R911 Solitary pulmonary nodule: Secondary | ICD-10-CM | POA: Insufficient documentation

## 2019-09-24 NOTE — Progress Notes (Signed)
59 y.o. [id number] Married Caucasian female here for annual exam.    No vaginal bleeding.   Has seen "kidney specialist" for urinary frequency in past year. 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  Drinks a lot of coffee - 2 pots by 10:00 am. Switching to decaf did not seem to help. No medication given.  She was told she had thickening in her bladder.  Hx renal stones and UTIs.  She has some fatigue.   Does have a therapist but has not done visits during Covid.   Due for Mohs surgery but is putting it off for now.  Not sexually active by choice. Had bleeding in the past and dryness.   Covid vaccine in April, 2021.   Patient helping to care for her parents.   PCP:  Blair Heys, PA-C   Patient's last menstrual period was 01/29/2015 (exact date).           Sexually active: Yes.    The current method of family planning is tubal ligation--Essure.    Exercising: Yes.    Works on a farm Smoker:  yes  Health Maintenance: Pap:12-28-17 Neg:Neg HR HPV, 03-13-16 Neg:Neg HR HPV, 01-18-15 Neg:Neg HR HPV History of abnormal Pap:  Yes, Abnormal pap x3 from 2007-2011 with colposcopy.Colpo with biopsy 03-31-16 Atypia--pap 03-13-16 Neg:Neg HR HPV;but raised white lesion on cervix.Colposcopy with biopsy on 02/11/15 - LGSIL, ECC benign. MMG:  09-04-19 Diag.Bil.w/Rt.US--Neg/density C/BiRads1/screening 63yr. Colonoscopy: 2016 polyp;next ?unsure BMD:   n/a  Result  n/a TDaP:  12-28-17 Gardasil:   no HIV: several years ago--Neg Hep C: 02-15-17 Neg Screening Labs:  PCP   reports that she has been smoking cigarettes. She started smoking about 40 years ago. She has a 17.50 pack-year smoking history. She has never used smokeless tobacco. She reports current alcohol use of about 1.0 standard drink of alcohol per week. She reports that she does not use drugs.  Past Medical History:  Diagnosis Date  . Abnormal finding on mammography, microcalcification 06/17/2012  . Abnormal Pap smear 2007, 2012, 2017    Colposcopy 2017 - LGSIL.  Marland Kitchen Abnormal uterine bleeding   . Acid reflux 02/05/2008   Overview:  Esophageal Reflux   . Anemia   . Anxiety   . B-complex deficiency 09/07/2006   Overview:  Vitamin B12 Deficiency   . Blood transfusion without reported diagnosis    age 63  . COPD with emphysema ([place])   . Depression   . Dizzy   . Dysmenorrhea   . Dyspnea on exertion   . Elevated hemoglobin A1c 2014  . Endometrial mass 11/07/2012  . Endometriosis   . Fibroid 2012, 2015, 2017      . Gastric ulcer 2014  . History of kidney stones   . Lung nodules 2019   sees pulmonology  . Major depressive disorder, single episode, severe, specified as with psychotic behavior 07/22/2012  . Nervous breakdown 07/2013   [facility] in [place]  . PTSD (post-traumatic stress disorder) 2018  . PTSD (post-traumatic stress disorder)   . [place] spotted fever 1970   hospitalized x 1 month, had a blood transfusion  . Thrombocytosis ([place]) 2014  . Uterine fibroid 11/07/2012    Past Surgical History:  Procedure Laterality Date  . CESAREAN SECTION  1993  . COLPOSCOPY  2010  . ESSURE TUBAL LIGATION  2010  . HYSTEROSCOPY WITH D & C N/A 12/17/2012   Procedure: DILATATION AND CURETTAGE /HYSTEROSCOPY AND RESECTOSCOPE;  Surgeon: Jamey Reas  de Berton Lan, MD;  Location: [facility];  Service: Gynecology;  Laterality: N/A;  . PELVIC LAPAROSCOPY    . TUBAL LIGATION     Essure    Current Outpatient Medications  Medication Sig Dispense Refill  . ALBUTEROL IN Inhale into the lungs.    . umeclidinium-vilanterol (ANORO ELLIPTA) 62.5-25 MCG/INH AEPB Inhale 1 puff into the lungs daily. 1 each 0   No current facility-administered medications for this visit.    Family History  Problem Relation Age of Onset  . Dementia Mother   . Diabetes Father   . Heart disease Father   . Kidney disease Father     Review of Systems  All other systems reviewed and are negative.   Exam:   BP 138/74   Pulse 88    Resp 18   Ht 5\' 3"  (1.6 m)   Wt 117 lb (53.1 kg)   LMP 01/29/2015 (Exact Date)   BMI 20.73 kg/m     General appearance: alert, cooperative and appears stated age Head: normocephalic, without obvious abnormality, atraumatic Neck: no adenopathy, supple, symmetrical, trachea midline and thyroid normal to inspection and palpation Lungs: clear to auscultation bilaterally Breasts: normal appearance, no masses or tenderness, No nipple retraction or dimpling, No nipple discharge or bleeding, No axillary adenopathy Heart: regular rate and rhythm Abdomen: soft, non-tender; no masses, no organomegaly Extremities: extremities normal, atraumatic, no cyanosis or edema Skin: skin color, texture, turgor normal. No rashes or lesions Lymph nodes: cervical, supraclavicular, and axillary nodes normal. Neurologic: grossly normal  Pelvic: External genitalia:  no lesions              No abnormal inguinal nodes palpated.              Urethra:  normal appearing urethra with no masses, tenderness or lesions              Bartholins and Skenes: normal                 Vagina: normal appearing vagina with normal color and discharge, no lesions              Cervix: small white raised area at 12:00 - 3 mm in diameter.               Pap taken: Yes.   Bimanual Exam:  Uterus:  normal size, contour, position, consistency, mobility, non-tender              Adnexa: no mass, fullness, tenderness              Rectal exam: Declined.   Chaperone was present for exam.  Assessment:   Well woman visit with normal exam. Hx Essure. Hx LGSIL.  Hx uterine fibroid. DM.  Smoker.  Renal stones.   Has PTSD and not bipoloar disorder.  Plan: Mammogram screening discussed. Self breast awareness reviewed. Pap and HR HPV as above. Guidelines for Calcium, Vitamin D, regular exercise program including cardiovascular and weight bearing exercise. She will check on her next colonoscopy with Dr. Francina Ames.  Follow up annually and  prn.   After visit summary provided.

## 2019-09-25 ENCOUNTER — Ambulatory Visit: Payer: 59 | Admitting: Obstetrics and Gynecology

## 2019-09-25 ENCOUNTER — Encounter: Payer: Self-pay | Admitting: Obstetrics and Gynecology

## 2019-09-25 ENCOUNTER — Other Ambulatory Visit: Payer: Self-pay

## 2019-09-25 ENCOUNTER — Other Ambulatory Visit (HOSPITAL_COMMUNITY)
Admission: RE | Admit: 2019-09-25 | Discharge: 2019-09-25 | Disposition: A | Discharge status: Home / Self Care | Payer: 59 | Source: Ambulatory Visit | Attending: Obstetrics and Gynecology | Admitting: Obstetrics and Gynecology

## 2019-09-25 VITALS — BP 138/74 | HR 88 | Resp 18 | Ht 63.0 in | Wt 117.0 lb

## 2019-09-25 DIAGNOSIS — Z01419 Encounter for gynecological examination (general) (routine) without abnormal findings: Secondary | ICD-10-CM

## 2019-09-25 NOTE — Patient Instructions (Signed)

## 2019-09-26 LAB — CYTOLOGY - PAP
Comment: NEGATIVE
Diagnosis: NEGATIVE
High risk HPV: NEGATIVE

## 2019-10-02 ENCOUNTER — Telehealth: Payer: Self-pay

## 2019-10-02 DIAGNOSIS — N879 Dysplasia of cervix uteri, unspecified: Secondary | ICD-10-CM

## 2019-10-02 DIAGNOSIS — N889 Noninflammatory disorder of cervix uteri, unspecified: Secondary | ICD-10-CM

## 2019-10-02 NOTE — Telephone Encounter (Signed)
Spoke with pt. Pt given results per Dr Quincy Simmonds. Pt states needs business office to call with benefits before scheduling Colpo.   Routing to [place] for precert, orders placed. Pt asks to be called any day after 2pm.

## 2019-10-02 NOTE — Telephone Encounter (Signed)
Left message for pt to return call to triage RN. 

## 2019-10-02 NOTE — Telephone Encounter (Signed)
-----   Message from Nunzio Cobbs, MD sent at 10/02/2019  6:39 AM EDT ----- Please contact patient regarding results. Her pap is normal and her HR HPV testing is negative.  She has a raised white area on her cervix, and I am recommending colposcopy for cervical lesion.  She has had this in the past in 2018 and colposcopy showed atypia then. This will need precert.

## 2019-10-07 NOTE — Telephone Encounter (Signed)
Call placed to convey benefits for colposcopy. °

## 2019-10-08 NOTE — Telephone Encounter (Signed)
Patient returned my call and I conveyed the benefits. Patient understands/agreeable with the benefits. Patient is aware of the cancellation policy.   Patient states she will have to check her schedule before scheduling. She will call back once she is ready to schedule.

## 2019-10-09 LAB — PULMONARY FUNCTION TEST
DL/VA % pred: 58 %
DL/VA: 2.5 ml/min/mmHg/L
DLCO cor % pred: 56 %
DLCO cor: 11.52 ml/min/mmHg
DLCO unc % pred: 56 %
DLCO unc: 11.52 ml/min/mmHg
FEF 25-75 Post: 0.75 L/sec
FEF 25-75 Pre: 0.83 L/sec
FEF2575-%Change-Post: -10 %
FEF2575-%Pred-Post: 30 %
FEF2575-%Pred-Pre: 33 %
FEV1-%Change-Post: -1 %
FEV1-%Pred-Post: 62 %
FEV1-%Pred-Pre: 63 %
FEV1-Post: 1.65 L
FEV1-Pre: 1.67 L
FEV1FVC-%Change-Post: 0 %
FEV1FVC-%Pred-Pre: 81 %
FEV6-%Change-Post: 0 %
FEV6-%Pred-Post: 76 %
FEV6-%Pred-Pre: 77 %
FEV6-Post: 2.5 L
FEV6-Pre: 2.53 L
FEV6FVC-%Change-Post: 0 %
FEV6FVC-%Pred-Post: 101 %
FEV6FVC-%Pred-Pre: 101 %
FVC-%Change-Post: -1 %
FVC-%Pred-Post: 75 %
FVC-%Pred-Pre: 76 %
FVC-Post: 2.55 L
FVC-Pre: 2.59 L
Post FEV1/FVC ratio: 65 %
Post FEV6/FVC ratio: 98 %
Pre FEV1/FVC ratio: 65 %
Pre FEV6/FVC Ratio: 98 %
RV % pred: 141 %
RV: 2.72 L
TLC % pred: 106 %
TLC: 5.37 L

## 2019-10-10 ENCOUNTER — Telehealth: Payer: Self-pay | Admitting: Pulmonary Disease

## 2019-10-10 NOTE — Telephone Encounter (Signed)
Called and spoke with pt who stated her Anoro was too expensive costing her $272. Pt is wanting to know if there is something else that would be cheaper for her.  Routing this to both pharmacy team as well as Dr. Jenetta Downer so we can see what might be able to be done for pt.

## 2019-10-12 NOTE — Telephone Encounter (Signed)
She needs to find out from her insurance what is covered of have pharmacy assist with checking what is comparably cheaper

## 2019-10-13 MED ORDER — ANORO ELLIPTA 62.5-25 MCG/INH IN AEPB: 1.0000 | INHALATION_SPRAY | Freq: Every day | RESPIRATORY_TRACT | 11 refills | Status: DC

## 2019-10-13 NOTE — Telephone Encounter (Signed)
Pt returning call.  [phone].  May leave message.

## 2019-10-13 NOTE — Telephone Encounter (Signed)
Anoro is the preferred under patient's plan.  Called [facility] pharmacy, pharmacist stated copay is after insurance and Anoro copay card, but insurance is no longer paying very much.   Called patient's insurance, rep states that patient is being charged a penalty for not filling through her plan's preferred pharmacies, so insurance is not fully covering claim. Plan wants patient to fill through [facility] mail order or [facility].  Patient already has an Anoro copay card:  To help you save on ANORO, we've included this coupon. Use it to pay as little as $0 a month for up to 12 months*. BIN#: [id number] PCN#: [id number] GRP#: [id number] ID#: [id number] Offer Expires: 01/09/2020 *For eligible commercially insured patients. Maximum savings of $190 per month between January 10, 2019 and April 09, 2019; and $150 per month at all other times.

## 2019-10-13 NOTE — Telephone Encounter (Signed)
This has been sent to pharmacy team. Will await a response from Rachael to see what is preferred.

## 2019-10-13 NOTE — Telephone Encounter (Signed)
Tried calling the pt and there was no answer- LMTCB.  

## 2019-10-13 NOTE — Telephone Encounter (Signed)
Spoke with the pt and notified of response per pharm team  She wanted to try using the [facility] in [place] since they are preferred pharm by her ins  Rx sent and she will call back if needed

## 2019-10-15 NOTE — Telephone Encounter (Signed)
[place]

## 2019-11-03 NOTE — Telephone Encounter (Signed)
Left message for pt to return call to triage RN. 

## 2019-12-03 NOTE — Telephone Encounter (Signed)
Left message for pt to return call to triage RN. 

## 2019-12-23 NOTE — Telephone Encounter (Signed)
Left message for pt to return call to triage RN. 

## 2019-12-29 NOTE — Telephone Encounter (Signed)
Attempted to call pt x 4 for return call to schedule colpo with Dr Quincy Simmonds. Pt was given benefits by [facility] on 10/08/19.  Routing to Dr Quincy Simmonds, please advise

## 2020-03-23 ENCOUNTER — Encounter: Payer: Self-pay | Admitting: Pulmonary Disease

## 2020-03-23 ENCOUNTER — Ambulatory Visit: Payer: 59 | Admitting: Pulmonary Disease

## 2020-03-23 ENCOUNTER — Other Ambulatory Visit: Payer: Self-pay

## 2020-03-23 VITALS — BP 128/74 | HR 92 | Temp 97.3°F | Ht 63.0 in | Wt 118.4 lb

## 2020-03-23 DIAGNOSIS — R911 Solitary pulmonary nodule: Secondary | ICD-10-CM

## 2020-03-23 NOTE — Patient Instructions (Signed)
We will schedule you for CT scan of the chest without contrast  Continue albuterol as needed  Continue smoking cessation efforts  Follow-up in 6 months

## 2020-03-23 NOTE — Progress Notes (Signed)
Ashley KAUFHOLD    [id number]    [birth year]  Primary Care Physician:Michaels, Eddie North, PA-C  Referring Physician: Doyle Askew, PA-C [address] [place],  [place] [address]  Chief complaint:   Patient with shortness of breath  HPI:  History of obstructive lung disease PFT did reveal moderate obstructive disease  Has cut down smoking, continue to work on quitting Had a CT scan showing lung nodules, emphysema  Chest discomfort that does not appear related to activities  She is able to stay active Stopped using Anoro since January, was making her feel like she has a web at the back of her throat Albuterol use about once or twice a week  The last CT scan in November 2020 did reveal a new nodule in the right upper lobe She is known to have emphysema  An active smoker, pack a day smoker, 40-pack-year smoking history -Not likely to quit -She states she has cut down significantly  Did have hemoptysis in the past that self resolved   Outpatient Encounter Medications as of 03/23/2020  Medication Sig  . ALBUTEROL IN Inhale into the lungs.  Marland Kitchen dexlansoprazole (DEXILANT) 60 MG capsule See admin instructions.  . sucralfate (CARAFATE) 1 GM/10ML suspension 10 mL  . umeclidinium-vilanterol (ANORO ELLIPTA) 62.5-25 MCG/INH AEPB Inhale 1 puff into the lungs daily.   No facility-administered encounter medications on file as of 03/23/2020.    Allergies as of 03/23/2020 - Review Complete 03/23/2020  Allergen Reaction Noted  . Codeine Nausea Only 12/09/2012    Past Medical History:  Diagnosis Date  . Abnormal finding on mammography, microcalcification 06/17/2012  . 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  Marland Kitchen Abnormal uterine bleeding   . Acid reflux 02/05/2008   Overview:  Esophageal Reflux   . Anemia   . Anxiety   . B-complex deficiency 09/07/2006   Overview:  Vitamin B12 Deficiency   . Blood transfusion without reported diagnosis     age 60  . COPD with emphysema ([place])   . Depression   . Dizzy   . Dysmenorrhea   . Dyspnea on exertion   . Elevated hemoglobin A1c 2014  . Endometrial mass 11/07/2012  . Endometriosis   . Fibroid 2012, 2015, 2017      . Gastric ulcer 2014  . History of kidney stones   . Lung nodules 2019   sees pulmonology  . Major depressive disorder, single episode, severe, specified as with psychotic behavior 07/22/2012  . Nervous breakdown 07/2013   [facility] in [place]  . PTSD (post-traumatic stress disorder) 2018  . PTSD (post-traumatic stress disorder)   . [place] spotted fever 1970   hospitalized x 1 month, had a blood transfusion  . Thrombocytosis 2014  . Uterine fibroid 11/07/2012    Past Surgical History:  Procedure Laterality Date  . CESAREAN SECTION  1993  . COLPOSCOPY  2010  . ESSURE TUBAL LIGATION  2010  . HYSTEROSCOPY WITH D & C N/A 12/17/2012   Procedure: DILATATION AND CURETTAGE /HYSTEROSCOPY AND RESECTOSCOPE;  Surgeon: Jamey Reas de Berton Lan, MD;  Location: [facility];  Service: Gynecology;  Laterality: N/A;  . PELVIC LAPAROSCOPY    . TUBAL LIGATION     Essure    Family History  Problem Relation Age of Onset  . Dementia Mother   . Diabetes Father   . Heart disease Father   . Kidney  disease Father     Social History   Socioeconomic History  . Marital status: Married    Spouse name: Not on file  . Number of children: Not on file  . Years of education: Not on file  . Highest education level: Not on file  Occupational History  . Not on file  Tobacco Use  . Smoking status: Current Every Day Smoker    Packs/day: 0.50    Years: 35.00    Pack years: 17.50    Types: Cigarettes    Start date: 12/09/1978  . Smokeless tobacco: Never Used  . Tobacco comment: half pack-pack daily-03/23/2020-AH  Vaping Use  . Vaping Use: Never used  Substance and Sexual Activity  . Alcohol use: Yes    Alcohol/week: 1.0 standard drink    Types: 1 Glasses  of wine per week  . Drug use: No  . Sexual activity: Yes    Partners: Male    Birth control/protection: Other-see comments    Comment: Essure  Other Topics Concern  . Not on file  Social History Narrative  . Not on file   Social Determinants of Health   Financial Resource Strain: Not on file  Food Insecurity: Not on file  Transportation Needs: Not on file  Physical Activity: Not on file  Stress: Not on file  Social Connections: Not on file  Intimate Partner Violence: Not on file    Review of Systems  Constitutional: Negative.  Negative for fatigue.  Respiratory: Negative for cough and shortness of breath.   Cardiovascular: Negative.  Negative for chest pain.  Psychiatric/Behavioral: Negative.     Vitals:   03/23/20 1418  BP: 128/74  Pulse: 92  Temp: (!) 97.3 F (36.3 C)  SpO2: 99%    Physical Exam Constitutional:      Appearance: Normal appearance. She is well-developed.  HENT:     Head: Normocephalic and atraumatic.  Eyes:     General:        Right eye: No discharge.        Left eye: No discharge.     Conjunctiva/sclera: Conjunctivae normal.     Pupils: Pupils are equal, round, and reactive to light.  Neck:     Thyroid: No thyromegaly.     Trachea: No tracheal deviation.  Cardiovascular:     Rate and Rhythm: Normal rate and regular rhythm.     Heart sounds: No murmur heard. No friction rub.  Pulmonary:     Effort: Pulmonary effort is normal. No respiratory distress.     Breath sounds: Normal breath sounds. No wheezing or rales.  Musculoskeletal:     Cervical back: No rigidity or tenderness.  Neurological:     Mental Status: She is alert.  Psychiatric:        Mood and Affect: Mood normal.     Data Reviewed: Previous CAT scan report from 2020 from [facility] reviewed, multiple lung nodules with emphysema  Most recent CT scan of the chest reviewed by myself showing multiple lung nodules  PFT again reviewed with the patient today  Assessment:   Chronic obstructive pulmonary disease -Off Anoro at present -Only using albuterol as needed  Lung nodules -We will repeat CT scan of the chest to follow-up on lung nodules  Chest discomfort -Comes and goes, does not appear to be related to activity -Previous CTs not showing any significant structural abnormalities  Plan/Recommendations: We will order repeat CT  Cessation efforts encouraged  Follow-up in 6 months  Shawntell Dixson  MD [facility] 03/23/2020, 2:28 PM  CC: Doyle Askew, PA-C

## 2020-04-08 ENCOUNTER — Ambulatory Visit
Admission: RE | Admit: 2020-04-08 | Discharge: 2020-04-08 | Disposition: A | Discharge status: Home / Self Care | Payer: 59 | Source: Ambulatory Visit | Attending: Pulmonary Disease | Admitting: Pulmonary Disease

## 2020-04-08 DIAGNOSIS — R911 Solitary pulmonary nodule: Secondary | ICD-10-CM

## 2020-07-11 ENCOUNTER — Telehealth: Payer: Self-pay | Admitting: Obstetrics and Gynecology

## 2020-07-11 NOTE — Telephone Encounter (Signed)
Please contact patient to schedule her annual exam and pap with me in September.   She declined a colposcopy last year with me.  She has an area of her cervix that I wanted to evaluate further.

## 2020-07-13 NOTE — Telephone Encounter (Signed)
Message sent to appointment desk to schedule.

## 2020-07-19 NOTE — Telephone Encounter (Signed)
Claudia sent note that she left message for patient to call but has not heard back from her.

## 2020-08-26 NOTE — Telephone Encounter (Signed)
Per Rosemarie Ax: Patient is scheduled for AEX on 10/11/20. (She was here last year Sep 22 and she could not do that week or last week in September.)

## 2020-08-26 NOTE — Telephone Encounter (Signed)
  I called patient and spoke with her today and let her know Dr. Quincy Simmonds asked me to call and schedule her AEX and PAP smear as she had recommended colpo last year and patient had declined.She agrees to talk with appointment scheduling. She said she is taking care of aging parents and grandchildren. I encouraged her about taking care of herself as well.  Message sent to appointment desk pool to please call her and schedule visit.

## 2020-09-20 ENCOUNTER — Other Ambulatory Visit: Payer: Self-pay

## 2020-09-20 ENCOUNTER — Encounter: Payer: Self-pay | Admitting: Pulmonary Disease

## 2020-09-20 ENCOUNTER — Ambulatory Visit: Payer: 59 | Admitting: Pulmonary Disease

## 2020-09-20 VITALS — BP 102/60 | HR 96 | Temp 98.3°F | Ht 64.0 in | Wt 119.0 lb

## 2020-09-20 DIAGNOSIS — R0600 Dyspnea, unspecified: Secondary | ICD-10-CM | POA: Diagnosis not present

## 2020-09-20 DIAGNOSIS — R0609 Other forms of dyspnea: Secondary | ICD-10-CM

## 2020-09-20 MED ORDER — DOXYCYCLINE HYCLATE 100 MG PO TABS: 100.0000 mg | ORAL_TABLET | Freq: Two times a day (BID) | ORAL | 0 refills | Status: DC

## 2020-09-20 NOTE — Progress Notes (Signed)
Ashley Mosley    [id number]    [birth year]  Primary Care Physician:Michaels, Eddie North, PA-C  Referring Physician: Doyle Askew, PA-C [address] [place],  [place] [address]  Chief complaint:   Patient with shortness of breath Cough with sputum production  HPI:  History of obstructive lung disease PFT did reveal moderate obstructive disease Cough with clear phlegm  Decreased exercise tolerance She continues to work on cutting down on smoking CT scan is stable  She is able to stay active Stopped using Anoro since January, was making her feel like she has a web at the back of her throat Albuterol use about once or twice a week  The last CT scan in November 2020 did reveal a new nodule in the right upper lobe She is known to have emphysema  An active smoker, pack a day smoker, 40-pack-year smoking history -Not likely to quit -She states she has cut down significantly  Did have hemoptysis in the past that self resolved   Outpatient Encounter Medications as of 09/20/2020  Medication Sig   ALBUTEROL IN Inhale into the lungs.   dexlansoprazole (DEXILANT) 60 MG capsule See admin instructions.   sucralfate (CARAFATE) 1 GM/10ML suspension 10 mL   umeclidinium-vilanterol (ANORO ELLIPTA) 62.5-25 MCG/INH AEPB Inhale 1 puff into the lungs daily.   No facility-administered encounter medications on file as of 09/20/2020.    Allergies as of 09/20/2020 - Review Complete 09/20/2020  Allergen Reaction Noted   Codeine Nausea Only 12/09/2012    Past Medical History:  Diagnosis Date   Abnormal finding on mammography, microcalcification 06/17/2012   Abnormal Pap smear 2007, 2012, 2017   Colposcopy 2017 - LGSIL.   Abnormal uterine bleeding    Acid reflux 02/05/2008   Overview:  Esophageal Reflux    Anemia    Anxiety    B-complex deficiency 09/07/2006   Overview:  Vitamin B12 Deficiency    Blood transfusion without reported diagnosis    age 60   COPD with emphysema  ([place])    Depression    Dizzy    Dysmenorrhea    Dyspnea on exertion    Elevated hemoglobin A1c 2014   Endometrial mass 11/07/2012   Endometriosis    Fibroid 2012, 2015, 2017       Gastric ulcer 2014   History of kidney stones    Lung nodules 2019   sees pulmonology   Major depressive disorder, single episode, severe, specified as with psychotic behavior 07/22/2012   Nervous breakdown 07/2013   [facility] in [place]   PTSD (post-traumatic stress disorder) 2018   PTSD (post-traumatic stress disorder)    [facility] spotted fever 1970   hospitalized x 1 month, had a blood transfusion   Thrombocytosis 2014   Uterine fibroid 11/07/2012    Past Surgical History:  Procedure Laterality Date   [place]  2010   ESSURE TUBAL LIGATION  2010   HYSTEROSCOPY WITH D & C N/A 12/17/2012   Procedure: DILATATION AND CURETTAGE /HYSTEROSCOPY AND RESECTOSCOPE;  Surgeon: Jamey Reas de Berton Lan, MD;  Location: [facility];  Service: Gynecology;  Laterality: N/A;   PELVIC LAPAROSCOPY     TUBAL LIGATION     Essure    Family History  Problem Relation Age of Onset   Dementia Mother    Diabetes Father    Heart disease Father    Kidney disease Father  Social History   Socioeconomic History   Marital status: Married    Spouse name: Not on file   Number of children: Not on file   Years of education: Not on file   Highest education level: Not on file  Occupational History   Not on file  Tobacco Use   Smoking status: Every Day    Packs/day: 0.50    Years: 35.00    Pack years: 17.50    Types: Cigarettes    Start date: 12/09/1978   Smokeless tobacco: Never   Tobacco comments:    half pack-pack daily-09/20/2020   Vaping Use   Vaping Use: Never used  Substance and Sexual Activity   Alcohol use: Yes    Alcohol/week: 1.0 standard drink    Types: 1 Glasses of wine per week   Drug use: No   Sexual activity: Yes    Partners: Male    Birth  control/protection: Other-see comments    Comment: Essure  Other Topics Concern   Not on file  Social History Narrative   Not on file   Social Determinants of Health   Financial Resource Strain: Not on file  Food Insecurity: Not on file  Transportation Needs: Not on file  Physical Activity: Not on file  Stress: Not on file  Social Connections: Not on file  Intimate Partner Violence: Not on file    Review of Systems  Constitutional: Negative.  Negative for fatigue.  Respiratory:  Negative for cough and shortness of breath.   Cardiovascular: Negative.  Negative for chest pain.  Psychiatric/Behavioral: Negative.     Vitals:   09/20/20 1503  BP: 102/60  Pulse: 96  Temp: 98.3 F (36.8 C)  SpO2: 97%    Physical Exam Constitutional:      Appearance: Normal appearance. She is well-developed.  HENT:     Head: Normocephalic and atraumatic.  Eyes:     General:        Right eye: No discharge.        Left eye: No discharge.     Conjunctiva/sclera: Conjunctivae normal.     Pupils: Pupils are equal, round, and reactive to light.  Neck:     Thyroid: No thyromegaly.     Trachea: No tracheal deviation.  Cardiovascular:     Rate and Rhythm: Normal rate and regular rhythm.     Heart sounds: No murmur heard.   No friction rub.  Pulmonary:     Effort: Pulmonary effort is normal. No respiratory distress.     Breath sounds: Normal breath sounds. No wheezing or rales.  Musculoskeletal:     Cervical back: No rigidity or tenderness.  Neurological:     Mental Status: She is alert.  Psychiatric:        Mood and Affect: Mood normal.    Data Reviewed: Previous CAT scan report from 2020 from [facility] reviewed, multiple lung nodules with emphysema  Most recent CT scan of the chest reviewed by myself showing multiple lung nodules 03/29/2020  PFT again reviewed with the patient today  Assessment:  Chronic obstructive pulmonary disease -Continues to use just albuterol  Lung  nodules -Stable on last CT  Chest discomfort -Comes and goes, does not appear to be related to activity -Previous CTs not showing any significant structural abnormalities  Plan/Recommendations: Smoking cessation efforts encouraged  Course of doxycycline 10 p.o. twice daily for 10 days  Encouraged to call us back if no significant improvement in symptoms  Obtain echocardiogram for dyspnea on  exertion  Follow-up in 6 months  Sherrilyn Rist MD [facility] 09/20/2020, 3:16 PM  CC: Doyle Askew, PA-C

## 2020-09-20 NOTE — Patient Instructions (Addendum)
Echocardiogram for dyspnea on exertion  Course of doxycycline to be used for 10 days  Call if symptoms not improving  I will see you back in about 6 months

## 2020-10-01 ENCOUNTER — Ambulatory Visit (HOSPITAL_BASED_OUTPATIENT_CLINIC_OR_DEPARTMENT_OTHER)
Admission: RE | Admit: 2020-10-01 | Discharge: 2020-10-01 | Disposition: A | Discharge status: Home / Self Care | Payer: 59 | Source: Ambulatory Visit | Attending: Pulmonary Disease | Admitting: Pulmonary Disease

## 2020-10-01 ENCOUNTER — Other Ambulatory Visit: Payer: Self-pay

## 2020-10-01 DIAGNOSIS — R0609 Other forms of dyspnea: Secondary | ICD-10-CM | POA: Diagnosis not present

## 2020-10-01 DIAGNOSIS — R0600 Dyspnea, unspecified: Secondary | ICD-10-CM | POA: Diagnosis present

## 2020-10-01 LAB — ECHOCARDIOGRAM COMPLETE
AR max vel: 2.31 cm2
AV Area VTI: 2 cm2
AV Area mean vel: 2.15 cm2
AV Mean grad: 5 mmHg
AV Peak grad: 7.8 mmHg
Ao pk vel: 1.4 m/s
Area-P 1/2: 2.7 cm2
Calc EF: 75 %
S' Lateral: 2.7 cm
Single Plane A2C EF: 77.8 %
Single Plane A4C EF: 72.2 %

## 2020-10-03 NOTE — Progress Notes (Signed)
Echocardiogram within normal limits

## 2020-10-11 ENCOUNTER — Ambulatory Visit: Payer: 59 | Admitting: Obstetrics and Gynecology

## 2020-10-11 NOTE — Progress Notes (Deleted)
60 y.o. [id number] Married Caucasian female here for annual exam.    PCP:     Patient's last menstrual period was 01/29/2015 (exact date).           Sexually active: {yes no:314532}  The current method of family planning is tubal ligation--Essure.    Exercising: {yes no:314532}  {types:19826} Smoker:  {YES NO:22349}  Health Maintenance: Pap:  09-25-19 Neg:Neg HR HPV, 12-28-17 Neg:Neg HR HPV, 03-13-16 Neg:Neg HR HPV History of abnormal Pap:  Yes, Abnormal pap x3 from 2007-2011 with colposcopy. Colpo with biopsy 03-31-16 Atypia--pap 03-13-16 Neg:Neg HR HPV;but raised white lesion on cervix. Colposcopy with biopsy on 02/11/15 - LGSIL, ECC benign. MMG:  ***09-04-19 Diag.Bil/w.Rt.Br.US//Neg/BiRads1 Colonoscopy:  2016 polyp;next ?unsure BMD:   n/a  Result  n/a TDaP:  12-28-17 Gardasil:   no HIV: Neg years ago Hep C: 02-15-17 Neg Screening Labs:  Hb today: ***, Urine today: ***   reports that she has been smoking cigarettes. She started smoking about 58 years ago. She has a 17.50 pack-year smoking history. She has never used smokeless tobacco. She reports current alcohol use of about 1.0 standard drink per week. She reports that she does not use drugs.  Past Medical History:  Diagnosis Date   Abnormal finding on mammography, microcalcification 06/17/2012   Abnormal Pap smear 2007, 2012, 2017   Colposcopy 2017 - LGSIL.   Abnormal uterine bleeding    Acid reflux 02/05/2008   Overview:  Esophageal Reflux    Anemia    Anxiety    B-complex deficiency 09/07/2006   Overview:  Vitamin B12 Deficiency    Blood transfusion without reported diagnosis    age 60   COPD with emphysema ([place])    Depression    Dizzy    Dysmenorrhea    Dyspnea on exertion    Elevated hemoglobin A1c 2014   Endometrial mass 11/07/2012   Endometriosis    Fibroid 2012, 2015, 2017       Gastric ulcer 2014   History of kidney stones    Lung nodules 2019   sees pulmonology   Major depressive disorder, single episode, severe,  specified as with psychotic behavior 07/22/2012   Nervous breakdown 07/2013   [facility] in [place]   PTSD (post-traumatic stress disorder) 2018   PTSD (post-traumatic stress disorder)    [facility] spotted fever 1970   hospitalized x 1 month, had a blood transfusion   Thrombocytosis 2014   Uterine fibroid 11/07/2012    Past Surgical History:  Procedure Laterality Date   [place]  2010   ESSURE TUBAL LIGATION  2010   HYSTEROSCOPY WITH D & C N/A 12/17/2012   Procedure: DILATATION AND CURETTAGE /HYSTEROSCOPY AND RESECTOSCOPE;  Surgeon: Jamey Reas de Berton Lan, MD;  Location: [facility];  Service: Gynecology;  Laterality: N/A;   PELVIC LAPAROSCOPY     TUBAL LIGATION     Essure    Current Outpatient Medications  Medication Sig Dispense Refill   ALBUTEROL IN Inhale into the lungs.     dexlansoprazole (DEXILANT) 60 MG capsule See admin instructions.     doxycycline (VIBRA-TABS) 100 MG tablet Take 1 tablet (100 mg total) by mouth 2 (two) times daily. 20 tablet 0   sucralfate (CARAFATE) 1 GM/10ML suspension 10 mL     umeclidinium-vilanterol (ANORO ELLIPTA) 62.5-25 MCG/INH AEPB Inhale 1 puff into the lungs daily. 30 each 11   No current facility-administered medications for this visit.  Family History  Problem Relation Age of Onset   Dementia Mother    Diabetes Father    Heart disease Father    Kidney disease Father     Review of Systems  Exam:   LMP 01/29/2015 (Exact Date)     General appearance: alert, cooperative and appears stated age Head: normocephalic, without obvious abnormality, atraumatic Neck: no adenopathy, supple, symmetrical, trachea midline and thyroid normal to inspection and palpation Lungs: clear to auscultation bilaterally Breasts: normal appearance, no masses or tenderness, No nipple retraction or dimpling, No nipple discharge or bleeding, No axillary adenopathy Heart: regular rate and rhythm Abdomen: soft,  non-tender; no masses, no organomegaly Extremities: extremities normal, atraumatic, no cyanosis or edema Skin: skin color, texture, turgor normal. No rashes or lesions Lymph nodes: cervical, supraclavicular, and axillary nodes normal. Neurologic: grossly normal  Pelvic: External genitalia:  no lesions              No abnormal inguinal nodes palpated.              Urethra:  normal appearing urethra with no masses, tenderness or lesions              Bartholins and Skenes: normal                 Vagina: normal appearing vagina with normal color and discharge, no lesions              Cervix: no lesions              Pap taken: {yes no:314532} Bimanual Exam:  Uterus:  normal size, contour, position, consistency, mobility, non-tender              Adnexa: no mass, fullness, tenderness              Rectal exam: {yes no:314532}.  Confirms.              Anus:  normal sphincter tone, no lesions  Chaperone was present for exam:  ***  Assessment:   Well woman visit with gynecologic exam.   Plan: Mammogram screening discussed. Self breast awareness reviewed. Pap and HR HPV as above. Guidelines for Calcium, Vitamin D, regular exercise program including cardiovascular and weight bearing exercise.   Follow up annually and prn.   Additional counseling given.  {yes [id number]. _______ minutes face to face time of which over 50% was spent in counseling.    After visit summary provided.

## 2020-12-30 ENCOUNTER — Ambulatory Visit: Payer: 59 | Admitting: Obstetrics and Gynecology

## 2021-01-07 ENCOUNTER — Other Ambulatory Visit: Payer: Self-pay | Admitting: Obstetrics and Gynecology

## 2021-01-07 DIAGNOSIS — Z1231 Encounter for screening mammogram for malignant neoplasm of breast: Secondary | ICD-10-CM

## 2021-01-28 ENCOUNTER — Ambulatory Visit
Admission: RE | Admit: 2021-01-28 | Discharge: 2021-01-28 | Disposition: A | Discharge status: Home / Self Care | Payer: 59 | Source: Ambulatory Visit | Attending: Obstetrics and Gynecology | Admitting: Obstetrics and Gynecology

## 2021-01-28 ENCOUNTER — Other Ambulatory Visit: Payer: Self-pay

## 2021-01-28 DIAGNOSIS — Z1231 Encounter for screening mammogram for malignant neoplasm of breast: Secondary | ICD-10-CM

## 2021-02-01 ENCOUNTER — Other Ambulatory Visit: Payer: Self-pay

## 2021-02-01 ENCOUNTER — Encounter: Payer: Self-pay | Admitting: Adult Health

## 2021-02-01 ENCOUNTER — Ambulatory Visit: Payer: 59 | Admitting: Adult Health

## 2021-02-01 ENCOUNTER — Ambulatory Visit (INDEPENDENT_AMBULATORY_CARE_PROVIDER_SITE_OTHER): Payer: 59

## 2021-02-01 VITALS — BP 110/60 | HR 98 | Temp 98.2°F | Ht 64.0 in | Wt 119.0 lb

## 2021-02-01 DIAGNOSIS — J441 Chronic obstructive pulmonary disease with (acute) exacerbation: Secondary | ICD-10-CM

## 2021-02-01 DIAGNOSIS — J449 Chronic obstructive pulmonary disease, unspecified: Secondary | ICD-10-CM

## 2021-02-01 MED ORDER — BENZONATATE 200 MG PO CAPS: 200.0000 mg | ORAL_CAPSULE | Freq: Three times a day (TID) | ORAL | 2 refills | Status: DC | PRN

## 2021-02-01 MED ORDER — AMOXICILLIN-POT CLAVULANATE 875-125 MG PO TABS: 1.0000 | ORAL_TABLET | Freq: Two times a day (BID) | ORAL | 0 refills | Status: AC

## 2021-02-01 NOTE — Patient Instructions (Addendum)
Sputum culture .  Augmentin 875mg  Twice daily  for 1 week, take with food.  Liquid Mucinex DM Twice daily  As needed  cough/congestion .  Tessalon Three times a day  As needed  cough .  Dentist when able .  Work on not smoking .  Begin Zyrtec 10mg  At bedtime  for 1 week then as needed.  Chest xray today .  Follow up with Dr. Ander Slade or Alysiana Ethridge NP in 6 weeks and As needed   Please contact office for sooner follow up if symptoms do not improve or worsen or seek emergency care

## 2021-02-01 NOTE — Progress Notes (Signed)
@Patient  ID: Ashley Mosley, female    DOB: 1960-10-27, 61 y.o.   MRN: [id number]  Chief Complaint  Patient presents with   Acute Visit    Referring provider: Barrie Lyme  HPI: 61 year old female active smoker followed for COPD with emphysema and lung nodule (stable on serial CT) Medical history significant for anxiety, paranoid schizophrenia, depression  TEST/EVENTS :  CT chest April 08, 2020-Resolved left lower lobe pulmonary nodule compatible with an infectious or inflammatory process on prior examination.   Stable right lower lobe subpleural pulmonary nodule, safely considered benign.   Moderate emphysema with stable mild pulmonary hyperinflation.  02/01/2021 Acute OV : Cough, COPD  Presents for an acute office visit . Complains of ongoing cough and congestion  for last 3 months, Initially treated with Doxycycline for 10 days in September.then a Zpack in November and December. Also got steroid shot (cant take steroid pills) . Gets some better but never totally goes away. Coughing so much her ribs are sore. Remains on ANORO but not taking on regular basis . We discussed the importance of med compliance. Has nasal congestion and drainage.  Has stress incontinence with coughing.  Still smoking 1 PPD.  Works Biochemist, clinical , Educational psychologist.  She denies any chest pain orthopnea PND or hemoptysis.  No intentional weight loss.     Allergies  Allergen Reactions   Codeine Nausea Only    Immunization History  Administered Date(s) Administered   PFIZER(Purple Top)SARS-COV-2 Vaccination 03/24/2019, 04/14/2019   Tdap 12/28/2017    Past Medical History:  Diagnosis Date   Abnormal finding on mammography, microcalcification 06/17/2012   Abnormal Pap smear 2007, 2012, 2017   Colposcopy 2017 - LGSIL.   Abnormal uterine bleeding    Acid reflux 02/05/2008   Overview:  Esophageal Reflux    Anemia    Anxiety    B-complex deficiency 09/07/2006   Overview:  Vitamin B12 Deficiency     Blood transfusion without reported diagnosis    age 42   COPD with emphysema ([place])    Depression    Dizzy    Dysmenorrhea    Dyspnea on exertion    Elevated hemoglobin A1c 2014   Endometrial mass 11/07/2012   Endometriosis    Fibroid 2012, 2015, 2017       Gastric ulcer 2014   History of kidney stones    Lung nodules 2019   sees pulmonology   Major depressive disorder, single episode, severe, specified as with psychotic behavior 07/22/2012   Nervous breakdown 07/2013   [facility] in [place]   PTSD (post-traumatic stress disorder) 2018   PTSD (post-traumatic stress disorder)    [facility] spotted fever 1970   hospitalized x 1 month, had a blood transfusion   Thrombocytosis 2014   Uterine fibroid 11/07/2012    Tobacco History: Social History   Tobacco Use  Smoking Status Every Day   Packs/day: 0.50   Years: 35.00   Pack years: 17.50   Types: Cigarettes   Start date: 12/09/1978  Smokeless Tobacco Never  Tobacco Comments   Sometimes less than a pack, sometimes a pack.  She can go 8 hours without one at all.  02/01/2021  hfb   Ready to quit: No Counseling given: Yes Tobacco comments: Sometimes less than a pack, sometimes a pack.  She can go 8 hours without one at all.  02/01/2021  hfb   Outpatient Medications Prior to Visit  Medication Sig Dispense Refill   ALBUTEROL IN Inhale into  the lungs.     umeclidinium-vilanterol (ANORO ELLIPTA) 62.5-25 MCG/INH AEPB Inhale 1 puff into the lungs daily. 30 each 11   dexlansoprazole (DEXILANT) 60 MG capsule See admin instructions. (Patient not taking: Reported on 02/01/2021)     doxycycline (VIBRA-TABS) 100 MG tablet Take 1 tablet (100 mg total) by mouth 2 (two) times daily. (Patient not taking: Reported on 02/01/2021) 20 tablet 0   sucralfate (CARAFATE) 1 GM/10ML suspension 10 mL (Patient not taking: Reported on 02/01/2021)     No facility-administered medications prior to visit.     Review of Systems:    Constitutional:   No  weight loss, night sweats,  Fevers, chills,  +fatigue, or  lassitude.  HEENT:   No headaches,  Difficulty swallowing,  Tooth/dental problems, or  Sore throat,                No sneezing, itching, ear ache, nasal congestion, post nasal drip,   CV:  No chest pain,  Orthopnea, PND, swelling in lower extremities, anasarca, dizziness, palpitations, syncope.   GI  No heartburn, indigestion, abdominal pain, nausea, vomiting, diarrhea, change in bowel habits, loss of appetite, bloody stools.   Resp: .  No chest wall deformity  Skin: no rash or lesions.  GU: no dysuria, change in color of urine, no urgency or frequency.  No flank pain, no hematuria   MS:  No joint pain or swelling.  No decreased range of motion.  No back pain.    Physical Exam  BP 110/60 (BP Location: Left Arm, Patient Position: Sitting, Cuff Size: Normal)    Pulse 98    Temp 98.2 F (36.8 C) (Oral)    Ht 5\' 4"  (1.626 m)    Wt 119 lb (54 kg)    LMP 01/29/2015 (Exact Date)    SpO2 94%    BMI 20.43 kg/m   GEN: A/Ox3; pleasant , NAD, well nourished    HEENT:  [place]/AT, , NOSE-clear, THROAT-clear, no lesions, no postnasal drip or exudate noted. Poor dentiton.   NECK:  Supple w/ fair ROM; no JVD; normal carotid impulses w/o bruits; no thyromegaly or nodules palpated; no lymphadenopathy.    RESP  scattered rhonchi  no accessory muscle use, no dullness to percussion  CARD:  RRR, no m/r/g, no peripheral edema, pulses intact, no cyanosis or clubbing.  GI:   Soft & nt; nml bowel sounds; no organomegaly or masses detected.   Musco: Warm bil, no deformities or joint swelling noted.   Neuro: alert, no focal deficits noted.    Skin: Warm, no lesions or rashes    Lab Results:  CBC   BMET   BNP No results found for: BNP  ProBNP No results found for: PROBNP  Imaging:     PFT Results Latest Ref Rng & Units 07/25/2019  FVC-Pre L 2.59  FVC-Predicted Pre % 76  FVC-Post L 2.55   FVC-Predicted Post % 75  Pre FEV1/FVC % % 65  Post FEV1/FCV % % 65  FEV1-Pre L 1.67  FEV1-Predicted Pre % 63  FEV1-Post L 1.65  DLCO uncorrected ml/min/mmHg 11.52  DLCO UNC% % 56  DLCO corrected ml/min/mmHg 11.52  DLCO COR %Predicted % 56  DLVA Predicted % 58  TLC L 5.37  TLC % Predicted % 106  RV % Predicted % 141    No results found for: NITRICOXIDE      Assessment & Plan:   COPD with acute exacerbation (HCC) Slow to resolve COPD exacerbation-check sputum culture today.  Check chest x-ray. Will treat with empiric antibiotics and cough control regimen.  Smoking cessation is encouraged. Patient also has significant dental caries.  Have encouraged her to seek dental care. Encouraged her to take her Anoro daily.  Plan  . Patient Instructions  Sputum culture .  Augmentin 875mg  Twice daily  for 1 week, take with food.  Liquid Mucinex DM Twice daily  As needed  cough/congestion .  Tessalon Three times a day  As needed  cough .  Dentist when able .  Work on not smoking .  Begin Zyrtec 10mg  At bedtime  for 1 week then as needed.  Chest xray today .  Follow up with Dr. Ander Slade or Shalan Neault NP in 6 weeks and As needed   Please contact office for sooner follow up if symptoms do not improve or worsen or seek emergency care        I spent   30 minutes dedicated to the care of this patient on the date of this encounter to include pre-visit review of records, face-to-face time with the patient discussing conditions above, post visit ordering of testing, clinical documentation with the electronic health record, making appropriate referrals as documented, and communicating necessary findings to members of the patients care team.    Rexene Edison, NP 02/01/2021

## 2021-02-01 NOTE — Assessment & Plan Note (Addendum)
Slow to resolve COPD exacerbation-check sputum culture today.  Check chest x-ray. Will treat with empiric antibiotics and cough control regimen.  Smoking cessation is encouraged. Patient also has significant dental caries.  Have encouraged her to seek dental care. Encouraged her to take her Anoro daily.  Plan  . Patient Instructions  Sputum culture .  Augmentin 875mg  Twice daily  for 1 week, take with food.  Liquid Mucinex DM Twice daily  As needed  cough/congestion .  Tessalon Three times a day  As needed  cough .  Dentist when able .  Work on not smoking .  Begin Zyrtec 10mg  At bedtime  for 1 week then as needed.  Chest xray today .  Follow up with Dr. Ander Slade or Tameshia Bonneville NP in 6 weeks and As needed   Please contact office for sooner follow up if symptoms do not improve or worsen or seek emergency care

## 2021-03-22 ENCOUNTER — Ambulatory Visit: Payer: 59 | Admitting: Pulmonary Disease

## 2021-03-22 ENCOUNTER — Other Ambulatory Visit: Payer: Self-pay

## 2021-03-22 ENCOUNTER — Encounter: Payer: Self-pay | Admitting: Pulmonary Disease

## 2021-03-22 VITALS — BP 112/64 | HR 57 | Temp 98.2°F | Ht 64.0 in | Wt 119.0 lb

## 2021-03-22 DIAGNOSIS — J449 Chronic obstructive pulmonary disease, unspecified: Secondary | ICD-10-CM | POA: Diagnosis not present

## 2021-03-22 DIAGNOSIS — R911 Solitary pulmonary nodule: Secondary | ICD-10-CM

## 2021-03-22 NOTE — Progress Notes (Signed)
? ?      ?Ashley Mosley    [id number]    15-Jun-1960 ? ?Primary Care Physician:Michaels, Eddie North, PA-C ? ?Referring Physician: Doyle Askew, PA-C ?[phone] ?[place],  [place] [address] ? ?Chief complaint:   ?Patient with obstructive lung disease, shortness of breath, in for follow-up ? ? ?HPI: ? ?She was recently seen in January for an exacerbation ?-This was related to having some work done on her house, exposure to dust and fumes ? ?Breathing is more stable at present ? ?She is on Anoro ?-She only uses it as needed, maybe about 3 to 4 days a week, may go 3 to 4 days without using it at all ?-She feels she does not want to get hooked on it ?-I did let her know that this is a way to keep her breathing tube stable and is supposed to be used daily ?-Albuterol to be used as needed ? ?PFT with moderate obstructive disease ? ?Continues to have a cough with clear phlegm in the mornings ? ?CT scan of the chest recently did show resolution of lower lobe nodule ?Extensive emphysema on CT ? ?The last CT scan in November 2020 did reveal a new nodule in the right upper lobe ?She is known to have emphysema ? ?An active smoker, 40-pack-year smoking history, currently smoking about three quarters of a pack ?-Not likely to quit ?-She states she has cut down significantly ? ?Did have hemoptysis in the past that self resolved ? ? ?Outpatient Encounter Medications as of 03/22/2021  ?Medication Sig  ? ALBUTEROL IN Inhale into the lungs.  ? fluticasone (FLONASE) 50 MCG/ACT nasal spray Place into both nostrils.  ? umeclidinium-vilanterol (ANORO ELLIPTA) 62.5-25 MCG/INH AEPB Inhale 1 puff into the lungs daily.  ? [DISCONTINUED] benzonatate (TESSALON) 200 MG capsule Take 1 capsule (200 mg total) by mouth 3 (three) times daily as needed for cough. (Patient not taking: Reported on 03/22/2021)  ? [DISCONTINUED] BREZTRI AEROSPHERE 160-9-4.8 MCG/ACT AERO Inhale into the lungs. (Patient not taking: Reported on 03/22/2021)  ? ?No  facility-administered encounter medications on file as of 03/22/2021.  ? ? ?Allergies as of 03/22/2021 - Review Complete 03/22/2021  ?Allergen Reaction Noted  ? Codeine Nausea Only 12/09/2012  ? ? ?Past Medical History:  ?Diagnosis Date  ? Abnormal finding on mammography, microcalcification 06/17/2012  ? Abnormal Pap smear 2007, 2012, 2017  ? Colposcopy 2017 - LGSIL.  ? Abnormal uterine bleeding   ? Acid reflux 02/05/2008  ? Overview:  Esophageal Reflux   ? Anemia   ? Anxiety   ? B-complex deficiency 09/07/2006  ? Overview:  Vitamin B12 Deficiency   ? Blood transfusion without reported diagnosis   ? age 13  ? COPD with emphysema ([place])   ? Depression   ? Dizzy   ? Dysmenorrhea   ? Dyspnea on exertion   ? Elevated hemoglobin A1c 2014  ? Endometrial mass 11/07/2012  ? Endometriosis   ? Fibroid 2012, 2015, 2017  ?    ? Gastric ulcer 2014  ? History of kidney stones   ? Lung nodules 2019  ? sees pulmonology  ? Major depressive disorder, single episode, severe, specified as with psychotic behavior 07/22/2012  ? Nervous breakdown 07/2013  ? [facility] in [place]  ? PTSD (post-traumatic stress disorder) 2018  ? PTSD (post-traumatic stress disorder)   ? [place] spotted fever 1970  ? hospitalized x 1 month, had a blood transfusion  ? Thrombocytosis 2014  ?  Uterine fibroid 11/07/2012  ? ? ?Past Surgical History:  ?Procedure Laterality Date  ? [place]  ? COLPOSCOPY  2010  ? ESSURE TUBAL LIGATION  2010  ? HYSTEROSCOPY WITH D & C N/A 12/17/2012  ? Procedure: DILATATION AND CURETTAGE /HYSTEROSCOPY AND RESECTOSCOPE;  Surgeon: Jamey Reas de Berton Lan, MD;  Location: [facility];  Service: Gynecology;  Laterality: N/A;  ? PELVIC LAPAROSCOPY    ? TUBAL LIGATION    ? Essure  ? ? ?Family History  ?Problem Relation Age of Onset  ? Dementia Mother   ? Diabetes Father   ? Heart disease Father   ? Kidney disease Father   ? ? ?Social History  ? ?Socioeconomic History  ? Marital status: Married  ?  Spouse name:  Not on file  ? Number of children: Not on file  ? Years of education: Not on file  ? Highest education level: Not on file  ?Occupational History  ? Not on file  ?Tobacco Use  ? Smoking status: Every Day  ?  Packs/day: 0.50  ?  Years: 35.00  ?  Pack years: 17.50  ?  Types: Cigarettes  ?  Start date: 12/09/1978  ? Smokeless tobacco: Never  ? Tobacco comments:  ?  Sometimes less than a pack, sometimes a pack.  She can go 8 hours without one at all.  02/01/2021  hfb  ?Vaping Use  ? Vaping Use: Never used  ?Substance and Sexual Activity  ? Alcohol use: Yes  ?  Alcohol/week: 1.0 standard drink  ?  Types: 1 Glasses of wine per week  ? Drug use: No  ? Sexual activity: Yes  ?  Partners: Male  ?  Birth control/protection: Other-see comments  ?  Comment: Essure  ?Other Topics Concern  ? Not on file  ?Social History Narrative  ? Not on file  ? ?Social Determinants of Health  ? ?Financial Resource Strain: Not on file  ?Food Insecurity: Not on file  ?Transportation Needs: Not on file  ?Physical Activity: Not on file  ?Stress: Not on file  ?Social Connections: Not on file  ?Intimate Partner Violence: Not on file  ? ? ?Review of Systems  ?Constitutional: Negative.  Negative for fatigue.  ?Respiratory:  Negative for cough and shortness of breath.   ?Cardiovascular: Negative.  Negative for chest pain.  ?Psychiatric/Behavioral: Negative.    ? ?Vitals:  ? 03/22/21 1431  ?BP: 112/64  ?Pulse: (!) 57  ?Temp: 98.2 ?F (36.8 ?C)  ?SpO2: 96%  ? ? ?Physical Exam ?Constitutional:   ?   Appearance: Normal appearance. She is well-developed.  ?HENT:  ?   Head: Normocephalic and atraumatic.  ?   Mouth/Throat:  ?   Mouth: Mucous membranes are moist.  ?Eyes:  ?   General:     ?   Right eye: No discharge.     ?   Left eye: No discharge.  ?   Conjunctiva/sclera: Conjunctivae normal.  ?   Pupils: Pupils are equal, round, and reactive to light.  ?Neck:  ?   Thyroid: No thyromegaly.  ?   Trachea: No tracheal deviation.  ?Cardiovascular:  ?   Rate and  Rhythm: Normal rate and regular rhythm.  ?   Heart sounds: No murmur heard. ?  No friction rub.  ?Pulmonary:  ?   Effort: Pulmonary effort is normal. No respiratory distress.  ?   Breath sounds: Normal breath sounds. No stridor. No wheezing or rhonchi.  ?Musculoskeletal:  ?  Cervical back: No rigidity or tenderness.  ?Neurological:  ?   Mental Status: She is alert.  ?Psychiatric:     ?   Mood and Affect: Mood normal.  ? ? ?Data Reviewed: ?Previous CAT scan report from 2020 from [facility] reviewed, multiple lung nodules with emphysema  ?Most recent CT scan of the chest reviewed by myself showing resolved lung nodules ? ?PFT again reviewed with the patient today ? ?Assessment:  ? ?Chronic obstructive pulmonary disease with exacerbation ?-Symptoms are much improved now ? ?History of lung nodules ?-Resolution of lung nodule on recent CT ? ?Active smoker ?-Unlikely to quit ?-Encouraged to continue to cut down and work on quitting ? ?Plan/Recommendations: ? ?Encourage smoking cessation ? ?Bronchodilator use ? ?Daily use of Anoro ? ?Follow-up in 6 months ? ?Encouraged to give [place] a call with any significant concerns ? ?Sherrilyn Rist MD ?[facility] ?03/22/2021, 2:33 PM ? ?CC: Doyle Askew, PA-C ? ? ?

## 2021-03-22 NOTE — Patient Instructions (Signed)
Continue Anoro and rescue inhaler use as needed ? ?Continue to work on quitting smoking ? ?I will see you in 6 months from here ? ?Call with significant concerns ?

## 2021-09-22 ENCOUNTER — Ambulatory Visit: Payer: 59 | Admitting: Adult Health

## 2021-09-22 ENCOUNTER — Encounter: Payer: Self-pay | Admitting: Adult Health

## 2021-09-22 ENCOUNTER — Ambulatory Visit (INDEPENDENT_AMBULATORY_CARE_PROVIDER_SITE_OTHER): Payer: 59

## 2021-09-22 VITALS — BP 126/84 | HR 103 | Temp 98.2°F | Wt 115.4 lb

## 2021-09-22 DIAGNOSIS — J441 Chronic obstructive pulmonary disease with (acute) exacerbation: Secondary | ICD-10-CM

## 2021-09-22 DIAGNOSIS — J449 Chronic obstructive pulmonary disease, unspecified: Secondary | ICD-10-CM | POA: Diagnosis not present

## 2021-09-22 LAB — POC COVID19 BINAXNOW: SARS Coronavirus 2 Ag: NEGATIVE

## 2021-09-22 MED ORDER — CEFDINIR 300 MG PO CAPS: 300.0000 mg | ORAL_CAPSULE | Freq: Two times a day (BID) | ORAL | 0 refills | Status: DC

## 2021-09-22 MED ORDER — STIOLTO RESPIMAT 2.5-2.5 MCG/ACT IN AERS: 2.0000 | INHALATION_SPRAY | Freq: Every day | RESPIRATORY_TRACT | 5 refills | Status: DC

## 2021-09-22 MED ORDER — METHYLPREDNISOLONE ACETATE 80 MG/ML IJ SUSP
80.0000 mg | Freq: Once | INTRAMUSCULAR | Status: AC
  Administered 2021-09-22: 80 mg via INTRAMUSCULAR

## 2021-09-22 NOTE — Progress Notes (Signed)
$'@Patient'Q$  ID: Ashley Mosley, female    DOB: 1960/11/23, 61 y.o.   MRN: [id number]  Chief Complaint  Patient presents with   Follow-up    Referring provider: Barrie Lyme  HPI: 61 year old female active smoker followed for COPD with emphysema and lung nodule (stable on serial CT) Medical history significant for anxiety, paranoid schizophrenia and depression  TEST/EVENTS :  CT chest April 08, 2020-Resolved left lower lobe pulmonary nodule compatible with an infectious or inflammatory process on prior examination.   Stable right lower lobe subpleural pulmonary nodule, safely considered benign.   Moderate emphysema with stable mild pulmonary hyperinflation.  09/22/2021 Acute OV : COPD  Patient presents for an acute office visit.  She complains of 5 days of increased cough, congestion, wheezing and thick yellow mucus.  She complains of pleuritic pain with severe coughing episodes. She has felt fevers.  She denies any hemoptysis, orthopnea, edema. COVID testing today is negative in the office.  Chest x-ray is clear today. Patient has been using over-the-counter cough medicines without much relief. Patient was previously on Anoro but says that the powder irritated her mouth.  Patient does continue to smoke.  We discussed smoking cessation.     Allergies  Allergen Reactions   Codeine Nausea Only    Immunization History  Administered Date(s) Administered   PFIZER(Purple Top)SARS-COV-2 Vaccination 03/24/2019, 04/14/2019   Tdap 12/28/2017    Past Medical History:  Diagnosis Date   Abnormal finding on mammography, microcalcification 06/17/2012   Abnormal Pap smear 2007, 2012, 2017   Colposcopy 2017 - LGSIL.   Abnormal uterine bleeding    Acid reflux 02/05/2008   Overview:  Esophageal Reflux    Anemia    Anxiety    B-complex deficiency 09/07/2006   Overview:  Vitamin B12 Deficiency    Blood transfusion without reported diagnosis    age 23   COPD with emphysema  ([place])    Depression    Dizzy    Dysmenorrhea    Dyspnea on exertion    Elevated hemoglobin A1c 2014   Endometrial mass 11/07/2012   Endometriosis    Fibroid 2012, 2015, 2017       Gastric ulcer 2014   History of kidney stones    Lung nodules 2019   sees pulmonology   Major depressive disorder, single episode, severe, specified as with psychotic behavior 07/22/2012   Nervous breakdown 07/2013   [facility] in [place]   PTSD (post-traumatic stress disorder) 2018   PTSD (post-traumatic stress disorder)    [facility] spotted fever 1970   hospitalized x 1 month, had a blood transfusion   Thrombocytosis 2014   Uterine fibroid 11/07/2012    Tobacco History: Social History   Tobacco Use  Smoking Status Every Day   Packs/day: 0.50   Years: 35.00   Total pack years: 17.50   Types: Cigarettes   Start date: 12/09/1978  Smokeless Tobacco Never  Tobacco Comments   Sometimes less than a pack, sometimes a pack.  She can go 8 hours without one at all.  02/01/2021  hfb   Ready to quit: No Counseling given: Yes Tobacco comments: Sometimes less than a pack, sometimes a pack.  She can go 8 hours without one at all.  02/01/2021  hfb   Outpatient Medications Prior to Visit  Medication Sig Dispense Refill   ALBUTEROL IN Inhale into the lungs.     fluticasone (FLONASE) 50 MCG/ACT nasal spray Place into both nostrils.  umeclidinium-vilanterol (ANORO ELLIPTA) 62.5-25 MCG/INH AEPB Inhale 1 puff into the lungs daily. (Patient not taking: Reported on 09/22/2021) 30 each 11   No facility-administered medications prior to visit.     Review of Systems:   Constitutional:   No  weight loss, night sweats,  Fevers, chills, + fatigue, or  lassitude.  HEENT:   No headaches,  Difficulty swallowing,  Tooth/dental problems, or  Sore throat,                No sneezing, itching, ear ache, nasal congestion, post nasal drip,   CV:  No chest pain,  Orthopnea, PND, swelling in lower  extremities, anasarca, dizziness, palpitations, syncope.   GI  No heartburn, indigestion, abdominal pain, nausea, vomiting, diarrhea, change in bowel habits, loss of appetite, bloody stools.   Resp:   No chest wall deformity  Skin: no rash or lesions.  GU: no dysuria, change in color of urine, no urgency or frequency.  No flank pain, no hematuria   MS:  No joint pain or swelling.  No decreased range of motion.  No back pain.    Physical Exam  BP 126/84 (BP Location: Left Arm, Patient Position: Sitting, Cuff Size: Normal)   Pulse (!) 103   Temp 98.2 F (36.8 C) (Oral)   Wt 115 lb 6.4 oz (52.3 kg)   LMP 11/10/2014 (Approximate)   SpO2 98%   BMI 19.81 kg/m   GEN: A/Ox3; pleasant , NAD, well nourished    HEENT:  [place]/AT,  NOSE-clear, THROAT-clear, no lesions, no postnasal drip or exudate noted.   NECK:  Supple w/ fair ROM; no JVD; normal carotid impulses w/o bruits; no thyromegaly or nodules palpated; no lymphadenopathy.    RESP  Clear  P & A; w/o, wheezes/ rales/ or rhonchi. no accessory muscle use, no dullness to percussion  CARD:  RRR, no m/r/g, no peripheral edema, pulses intact, no cyanosis or clubbing.  GI:   Soft & nt; nml bowel sounds; no organomegaly or masses detected.   Musco: Warm bil, no deformities or joint swelling noted.   Neuro: alert, no focal deficits noted.    Skin: Warm, no lesions or rashes    Lab Results:     BNP No results found for: "BNP"  ProBNP No results found for: "PROBNP"  Imaging: DG Chest 2 View  Result Date: 09/22/2021 CLINICAL DATA:  COPD. EXAM: CHEST - 2 VIEW COMPARISON:  February 01, 2021. FINDINGS: The heart size and mediastinal contours are within normal limits. Both lungs are clear. The visualized skeletal structures are unremarkable. IMPRESSION: No active cardiopulmonary disease. Electronically Signed   By: Marijo Conception M.D.   On: 09/22/2021 12:07    methylPREDNISolone acetate (DEPO-MEDROL) injection 80 mg     Date  Action Dose Route User   09/22/2021 1230 Given 80 mg Intramuscular (Left Ventrogluteal) Monna Fam L, RN          Latest Ref Rng & Units 07/25/2019    2:50 PM  PFT Results  FVC-Pre L 2.59   FVC-Predicted Pre % 76   FVC-Post L 2.55   FVC-Predicted Post % 75   Pre FEV1/FVC % % 65   Post FEV1/FCV % % 65   FEV1-Pre L 1.67   FEV1-Predicted Pre % 63   FEV1-Post L 1.65   DLCO uncorrected ml/min/mmHg 11.52   DLCO UNC% % 56   DLCO corrected ml/min/mmHg 11.52   DLCO COR %Predicted % 56   DLVA Predicted % 58  TLC L 5.37   TLC % Predicted % 106   RV % Predicted % 141     No results found for: "NITRICOXIDE"      Assessment & Plan:   COPD with acute exacerbation (HCC) Acute COPD exacerbation.  We will treat with empiric antibiotics.  Patient says she cannot tolerate oral steroids.  We will give a Depo-Medrol 80 mg injection in office.  Patient says she has done well on Medrol injections in the past.  Encouraged on smoking cessation.  Chest x-ray today in the office is negative.  COVID-19 testing is negative. Unable to tolerate Anoro.  We will change over to Stiolto to see if this is more tolerable.  Plan  Patient Instructions  Omnicef '300mg'$  Twice daily, take with food.  Depo Medrol '80mg'$  IM x 1 .  Begin Stiolto 2 puffs daily.  Albuterol inhaler As needed   Mucinex DM Twice daily  As needed  cough/congestion .  Tessalon Three times a day  As needed  cough .  Work on not smoking .  Begin Zyrtec '10mg'$  At bedtime  for 1 week then as needed.  Follow up with Dr. Ander Slade in 6-8 weeks and As needed   Please contact office for sooner follow up if symptoms do not improve or worsen or seek emergency care         Rexene Edison, NP 09/22/2021

## 2021-09-22 NOTE — Assessment & Plan Note (Addendum)
Acute COPD exacerbation.  We will treat with empiric antibiotics.  Patient says she cannot tolerate oral steroids.  We will give a Depo-Medrol 80 mg injection in office.  Patient says she has done well on Medrol injections in the past.  Encouraged on smoking cessation.  Chest x-ray today in the office is negative.  COVID-19 testing is negative. Unable to tolerate Anoro.  We will change over to Stiolto to see if this is more tolerable.  Plan  Patient Instructions  Omnicef '300mg'$  Twice daily, take with food.  Depo Medrol '80mg'$  IM x 1 .  Begin Stiolto 2 puffs daily.  Albuterol inhaler As needed   Mucinex DM Twice daily  As needed  cough/congestion .  Tessalon Three times a day  As needed  cough .  Work on not smoking .  Begin Zyrtec '10mg'$  At bedtime  for 1 week then as needed.  Follow up with Dr. Ander Slade in 6-8 weeks and As needed   Please contact office for sooner follow up if symptoms do not improve or worsen or seek emergency care

## 2021-09-22 NOTE — Patient Instructions (Addendum)
Omnicef '300mg'$  Twice daily, take with food.  Depo Medrol '80mg'$  IM x 1 .  Begin Stiolto 2 puffs daily.  Albuterol inhaler As needed   Mucinex DM Twice daily  As needed  cough/congestion .  Tessalon Three times a day  As needed  cough .  Work on not smoking .  Begin Zyrtec '10mg'$  At bedtime  for 1 week then as needed.  Follow up with Dr. Ander Slade in 6-8 weeks and As needed   Please contact office for sooner follow up if symptoms do not improve or worsen or seek emergency care

## 2021-10-03 ENCOUNTER — Encounter: Payer: Self-pay | Admitting: Pulmonary Disease

## 2021-10-03 ENCOUNTER — Ambulatory Visit: Payer: 59 | Admitting: Pulmonary Disease

## 2021-10-03 VITALS — BP 120/74 | HR 105 | Temp 98.4°F | Ht 64.0 in | Wt 113.4 lb

## 2021-10-03 DIAGNOSIS — J449 Chronic obstructive pulmonary disease, unspecified: Secondary | ICD-10-CM | POA: Diagnosis not present

## 2021-10-03 MED ORDER — ALBUTEROL SULFATE HFA 108 (90 BASE) MCG/ACT IN AERS: 2.0000 | INHALATION_SPRAY | Freq: Four times a day (QID) | RESPIRATORY_TRACT | 6 refills | Status: DC | PRN

## 2021-10-03 NOTE — Patient Instructions (Signed)
We will see you in about 3 months  Call if you do not continue to feel better  Continue Stiolto

## 2021-10-03 NOTE — Addendum Note (Signed)
Addended by: Dessie Coma on: 10/03/2021 03:13 PM   Modules accepted: Orders

## 2021-10-03 NOTE — Progress Notes (Signed)
Ashley Mosley    [id number]    05-03-1960  Primary Care Physician:Michaels, Eddie North, PA-C  Referring Physician: Doyle Askew, PA-C [address] [place],  [place] [address]  Chief complaint:   Patient with obstructive lung disease, shortness of breath, in for follow-up   HPI:  Was recently treated with a course of cefdinir for COPD exacerbation Cough with yellow creamy mucus  Symptoms are improving  Transition from Anoro to [employer] which seems to be working better for her as well  Still smoking actively, may smoke couple cigarettes and then half a pack She feels she is doing better with the cigarette smoking  Aware of risks with continuing to smoke  PFT with moderate obstructive disease  She has an extensive eczema on her CT scan, resolution of the previously noted lower lobe nodule  The last CT scan in November 2020 did reveal a new nodule in the right upper lobe She is known to have emphysema  An active smoker, 40-pack-year smoking history, currently smoking about three quarters of a pack -Not likely to quit -She states she has cut down significantly  Did have hemoptysis in the past that self resolved   Outpatient Encounter Medications as of 10/03/2021  Medication Sig   ALBUTEROL IN Inhale into the lungs.   fluticasone (FLONASE) 50 MCG/ACT nasal spray Place into both nostrils.   Tiotropium Bromide-Olodaterol (STIOLTO RESPIMAT) 2.5-2.5 MCG/ACT AERS Inhale 2 puffs into the lungs daily.   [DISCONTINUED] cefdinir (OMNICEF) 300 MG capsule Take 1 capsule (300 mg total) by mouth 2 (two) times daily. (Patient not taking: Reported on 10/03/2021)   No facility-administered encounter medications on file as of 10/03/2021.    Allergies as of 10/03/2021 - Review Complete 10/03/2021  Allergen Reaction Noted   Codeine Nausea Only 12/09/2012    Past Medical History:  Diagnosis Date   Abnormal finding on mammography, microcalcification 06/17/2012    Abnormal Pap smear 2007, 2012, 2017   Colposcopy 2017 - LGSIL.   Abnormal uterine bleeding    Acid reflux 02/05/2008   Overview:  Esophageal Reflux    Anemia    Anxiety    B-complex deficiency 09/07/2006   Overview:  Vitamin B12 Deficiency    Blood transfusion without reported diagnosis    age 61   COPD with emphysema ([place])    Depression    Dizzy    Dysmenorrhea    Dyspnea on exertion    Elevated hemoglobin A1c 2014   Endometrial mass 11/07/2012   Endometriosis    Fibroid 2012, 2015, 2017       Gastric ulcer 2014   History of kidney stones    Lung nodules 2019   sees pulmonology   Major depressive disorder, single episode, severe, specified as with psychotic behavior 07/22/2012   Nervous breakdown 07/2013   [facility] in [place]   PTSD (post-traumatic stress disorder) 2018   PTSD (post-traumatic stress disorder)    [facility] spotted fever 1970   hospitalized x 1 month, had a blood transfusion   Thrombocytosis 2014   Uterine fibroid 11/07/2012    Past Surgical History:  Procedure Laterality Date   [place]  2010   ESSURE TUBAL LIGATION  2010   HYSTEROSCOPY WITH D & C N/A 12/17/2012   Procedure: DILATATION AND CURETTAGE /HYSTEROSCOPY AND RESECTOSCOPE;  Surgeon: Jamey Reas de Berton Lan, MD;  Location: [facility];  Service: Gynecology;  Laterality: N/A;   PELVIC LAPAROSCOPY     TUBAL LIGATION     Essure    Family History  Problem Relation Age of Onset   Dementia Mother    Diabetes Father    Heart disease Father    Kidney disease Father     Social History   Socioeconomic History   Marital status: Married    Spouse name: Not on file   Number of children: Not on file   Years of education: Not on file   Highest education level: Not on file  Occupational History   Not on file  Tobacco Use   Smoking status: Every Day    Packs/day: 0.50    Years: 35.00    Total pack years: 17.50    Types: Cigarettes    Start  date: 12/09/1978   Smokeless tobacco: Never  Vaping Use   Vaping Use: Never used  Substance and Sexual Activity   Alcohol use: Yes    Alcohol/week: 1.0 standard drink of alcohol    Types: 1 Glasses of wine per week   Drug use: No   Sexual activity: Yes    Partners: Male    Birth control/protection: Other-see comments    Comment: Essure  Other Topics Concern   Not on file  Social History Narrative   Not on file   Social Determinants of Health   Financial Resource Strain: Not on file  Food Insecurity: Not on file  Transportation Needs: Not on file  Physical Activity: Not on file  Stress: Not on file  Social Connections: Not on file  Intimate Partner Violence: Not on file    Review of Systems  Constitutional: Negative.  Negative for fatigue.  Respiratory:  Negative for cough and shortness of breath.   Cardiovascular: Negative.  Negative for chest pain.  Psychiatric/Behavioral: Negative.      Vitals:   10/03/21 1444  BP: 120/74  Pulse: (!) 105  Temp: 98.4 F (36.9 C)  SpO2: 96%    Physical Exam Constitutional:      Appearance: Normal appearance. She is well-developed.  HENT:     Head: Normocephalic and atraumatic.     Mouth/Throat:     Mouth: Mucous membranes are moist.  Eyes:     General:        Right eye: No discharge.        Left eye: No discharge.     Conjunctiva/sclera: Conjunctivae normal.     Pupils: Pupils are equal, round, and reactive to light.  Neck:     Thyroid: No thyromegaly.     Trachea: No tracheal deviation.  Cardiovascular:     Rate and Rhythm: Normal rate and regular rhythm.     Heart sounds: No murmur heard.    No friction rub.  Pulmonary:     Effort: Pulmonary effort is normal. No respiratory distress.     Breath sounds: Normal breath sounds. No stridor. No wheezing or rhonchi.  Musculoskeletal:     Cervical back: No rigidity or tenderness.  Neurological:     Mental Status: She is alert.  Psychiatric:        Mood and Affect:  Mood normal.    Data Reviewed: Previous CAT scan reviewed, Recent PFT reviewed  Assessment:   Chronic obstructive pulmonary disease with exacerbation With recent exacerbation -Required a course of antibiotics -Completed course of cefdinir  History of lung nodules -Resolution of lung nodule on recent CT  Active smoker -She continues to try to cut down  Plan/Recommendations:  Continue Stiolto  Smoking cessation counseling  Follow-up in 3 months.  Encouraged to call with significant concerns Sherrilyn Rist MD [facility] 10/03/2021, 3:05 PM  CC: Doyle Askew, PA-C

## 2021-12-12 ENCOUNTER — Encounter: Payer: Self-pay | Admitting: Pulmonary Disease

## 2021-12-12 ENCOUNTER — Other Ambulatory Visit: Payer: Self-pay | Admitting: Pulmonary Disease

## 2021-12-12 ENCOUNTER — Ambulatory Visit: Payer: 59 | Admitting: Pulmonary Disease

## 2021-12-12 VITALS — BP 118/68 | HR 105 | Temp 98.1°F | Ht 64.0 in | Wt 114.6 lb

## 2021-12-12 DIAGNOSIS — J449 Chronic obstructive pulmonary disease, unspecified: Secondary | ICD-10-CM | POA: Diagnosis not present

## 2021-12-12 NOTE — Progress Notes (Signed)
Ashley Mosley    [id number]    02/27/1960  Primary Care Physician:Michaels, Eddie North, PA-C  Referring Physician: Doyle Askew, PA-C [place],  [place] [address]  Chief complaint:   Patient with obstructive lung disease, shortness of breath, in for follow-up   HPI:  Has been feeling relatively well  She has a cough with mucus production sometimes heavy and sometimes not as significant  She uses Stiolto frequently enough but not on a daily basis  Symptoms are relatively stable  Activity level remains about the same  She still continues to smoke about half a pack a day  PFT with moderate obstructive disease, previous CT with emphysema  Last CT was in 2023 March An active smoker, 40-pack-year smoking history, currently smoking half to three quarters of a pack -Not likely to quit -She states she has cut down significantly  Did have hemoptysis in the past that self resolved   Outpatient Encounter Medications as of 12/12/2021  Medication Sig   albuterol (VENTOLIN HFA) 108 (90 Base) MCG/ACT inhaler Inhale 2 puffs into the lungs every 6 (six) hours as needed for wheezing or shortness of breath.   aspirin EC 81 MG tablet Take 81 mg by mouth every 4 (four) hours as needed for mild pain. Swallow whole.   fluticasone (FLONASE) 50 MCG/ACT nasal spray Place into both nostrils.   Tiotropium Bromide-Olodaterol (STIOLTO RESPIMAT) 2.5-2.5 MCG/ACT AERS Inhale 2 puffs into the lungs daily.   [DISCONTINUED] ALBUTEROL IN Inhale into the lungs.   No facility-administered encounter medications on file as of 12/12/2021.    Allergies as of 12/12/2021 - Review Complete 12/12/2021  Allergen Reaction Noted   Codeine Nausea Only 12/09/2012    Past Medical History:  Diagnosis Date   Abnormal finding on mammography, microcalcification 06/17/2012   Abnormal Pap smear 2007, 2012, 2017   Colposcopy 2017 - LGSIL.   Abnormal uterine bleeding    Acid reflux  02/05/2008   Overview:  Esophageal Reflux    Anemia    Anxiety    B-complex deficiency 09/07/2006   Overview:  Vitamin B12 Deficiency    Blood transfusion without reported diagnosis    age 61   COPD with emphysema ([place])    Depression    Dizzy    Dysmenorrhea    Dyspnea on exertion    Elevated hemoglobin A1c 2014   Endometrial mass 11/07/2012   Endometriosis    Fibroid 2012, 2015, 2017       Gastric ulcer 2014   History of kidney stones    Lung nodules 2019   sees pulmonology   Major depressive disorder, single episode, severe, specified as with psychotic behavior 07/22/2012   Nervous breakdown 07/2013   [facility] in [place]   PTSD (post-traumatic stress disorder) 2018   PTSD (post-traumatic stress disorder)    [facility] spotted fever 1970   hospitalized x 1 month, had a blood transfusion   Thrombocytosis 2014   Uterine fibroid 11/07/2012    Past Surgical History:  Procedure Laterality Date   [place]  2010   ESSURE TUBAL LIGATION  2010   HYSTEROSCOPY WITH D & C N/A 12/17/2012   Procedure: DILATATION AND CURETTAGE /HYSTEROSCOPY AND RESECTOSCOPE;  Surgeon: Jamey Reas de Berton Lan, MD;  Location: [facility];  Service: Gynecology;  Laterality: N/A;   PELVIC LAPAROSCOPY     TUBAL LIGATION  Essure    Family History  Problem Relation Age of Onset   Dementia Mother    Diabetes Father    Heart disease Father    Kidney disease Father     Social History   Socioeconomic History   Marital status: Married    Spouse name: Not on file   Number of children: Not on file   Years of education: Not on file   Highest education level: Not on file  Occupational History   Not on file  Tobacco Use   Smoking status: Every Day    Packs/day: 1.00    Years: 35.00    Total pack years: 35.00    Types: Cigarettes    Start date: 12/09/1978   Smokeless tobacco: Never  Vaping Use   Vaping Use: Never used  Substance and Sexual  Activity   Alcohol use: Yes    Alcohol/week: 1.0 standard drink of alcohol    Types: 1 Glasses of wine per week   Drug use: No   Sexual activity: Yes    Partners: Male    Birth control/protection: Other-see comments    Comment: Essure  Other Topics Concern   Not on file  Social History Narrative   Not on file   Social Determinants of Health   Financial Resource Strain: Not on file  Food Insecurity: Not on file  Transportation Needs: Not on file  Physical Activity: Not on file  Stress: Not on file  Social Connections: Not on file  Intimate Partner Violence: Not on file   Review of Systems  Constitutional: Negative.  Negative for fatigue.  Respiratory:  Negative for cough and shortness of breath.   Cardiovascular: Negative.  Negative for chest pain.  Psychiatric/Behavioral: Negative.     Vitals:   12/12/21 1437  BP: 118/68  Pulse: (!) 105  Temp: 98.1 F (36.7 C)  SpO2: 97%   Physical Exam Constitutional:      Appearance: Normal appearance. She is well-developed.  HENT:     Head: Normocephalic and atraumatic.     Mouth/Throat:     Mouth: Mucous membranes are moist.  Eyes:     General:        Right eye: No discharge.        Left eye: No discharge.     Conjunctiva/sclera: Conjunctivae normal.     Pupils: Pupils are equal, round, and reactive to light.  Neck:     Thyroid: No thyromegaly.     Trachea: No tracheal deviation.  Cardiovascular:     Rate and Rhythm: Normal rate and regular rhythm.     Heart sounds: No murmur heard.    No friction rub.  Pulmonary:     Effort: Pulmonary effort is normal. No respiratory distress.     Breath sounds: Normal breath sounds. No stridor. No wheezing or rhonchi.  Musculoskeletal:     Cervical back: No rigidity or tenderness.  Neurological:     Mental Status: She is alert.  Psychiatric:        Mood and Affect: Mood normal.    Data Reviewed: Previous CAT scan reviewed, Recent PFT reviewed  Assessment:   Chronic  obstructive pulmonary disease  History of lung nodules -Reviewed resolved all last CT  An active smoker  Plan/Recommendations:  Continue Stiolto  Smoking cessation counseling  Follow-up in 6 months  Repeat PFT at next visit  Encouraged to call with significant concerns Sherrilyn Rist MD [facility] 12/12/2021, 2:50 PM  CC: Marcell Anger,  Eddie North, PA-C

## 2021-12-12 NOTE — Patient Instructions (Signed)
PFT on day of next visit  I will see you 6 months from here  Continue Stiolto  Call us with significant concerns

## 2022-04-28 ENCOUNTER — Ambulatory Visit: Payer: 59 | Admitting: Pulmonary Disease

## 2022-04-28 ENCOUNTER — Encounter: Payer: Self-pay | Admitting: Pulmonary Disease

## 2022-04-28 VITALS — BP 112/62 | HR 81 | Ht 64.0 in | Wt 116.0 lb

## 2022-04-28 DIAGNOSIS — R0609 Other forms of dyspnea: Secondary | ICD-10-CM

## 2022-04-28 DIAGNOSIS — J449 Chronic obstructive pulmonary disease, unspecified: Secondary | ICD-10-CM | POA: Diagnosis not present

## 2022-04-28 MED ORDER — BUPROPION HCL ER (SR) 150 MG PO TB12: 150.0000 mg | ORAL_TABLET | Freq: Two times a day (BID) | ORAL | 3 refills | Status: DC

## 2022-04-28 MED ORDER — NICOTINE 14 MG/24HR TD PT24: 14.0000 mg | MEDICATED_PATCH | Freq: Every day | TRANSDERMAL | 3 refills | Status: AC

## 2022-04-28 NOTE — Progress Notes (Signed)
Ashley Mosley    [id number]    [birth year]  Primary Care Physician:Michaels, Margaretmary Dys, PA-C  Referring Physician: Rick Duff, PA-C [address] [place],  [place] [address]  Chief complaint:   Patient with obstructive lung disease, shortness of breath, in for follow-up   HPI:  No significant changes in status  Has not been using any inhalers on a regular basis  Still does get short of breath with activity  Less coughing, less productivity at present  Activity level remains about the same  She states she is willing to try to quit smoking -We talked about Wellbutrin and nicotine patch  She feels she is able to tolerate most activities of daily living  Activity level remains about the same  She still continues to smoke about half a pack a day  PFT with moderate obstructive disease, previous CT with emphysema  Last CT was in 2023 March An active smoker, 40-pack-year smoking history, currently smoking half to three quarters of a pack -Not likely to quit -She states she has cut down significantly  Did have hemoptysis in the past that self resolved   Outpatient Encounter Medications as of 04/28/2022  Medication Sig   aspirin EC 81 MG tablet Take 81 mg by mouth every 4 (four) hours as needed for mild pain. Swallow whole.   albuterol (VENTOLIN HFA) 108 (90 Base) MCG/ACT inhaler Inhale 2 puffs into the lungs every 6 (six) hours as needed for wheezing or shortness of breath. (Patient not taking: Reported on 04/28/2022)   fluticasone (FLONASE) 50 MCG/ACT nasal spray Place into both nostrils. (Patient not taking: Reported on 04/28/2022)   Tiotropium Bromide-Olodaterol (STIOLTO RESPIMAT) 2.5-2.5 MCG/ACT AERS Inhale 2 puffs into the lungs daily. (Patient not taking: Reported on 04/28/2022)   No facility-administered encounter medications on file as of 04/28/2022.    Allergies as of 04/28/2022 - Review Complete 04/28/2022  Allergen Reaction Noted   Codeine  Nausea Only 12/09/2012    Past Medical History:  Diagnosis Date   Abnormal finding on mammography, microcalcification 06/17/2012   Abnormal Pap smear 2007, 2012, 2017   Colposcopy 2017 - LGSIL.   Abnormal uterine bleeding    Acid reflux 02/05/2008   Overview:  Esophageal Reflux    Anemia    Anxiety    B-complex deficiency 09/07/2006   Overview:  Vitamin B12 Deficiency    Blood transfusion without reported diagnosis    age 62   COPD with emphysema    Depression    Dizzy    Dysmenorrhea    Dyspnea on exertion    Elevated hemoglobin A1c 2014   Endometrial mass 11/07/2012   Endometriosis    Fibroid 2012, 2015, 2017       Gastric ulcer 2014   History of kidney stones    Lung nodules 2019   sees pulmonology   Major depressive disorder, single episode, severe, specified as with psychotic behavior 07/22/2012   Nervous breakdown 07/2013   [facility] in [place]   PTSD (post-traumatic stress disorder) 2018   PTSD (post-traumatic stress disorder)    [facility] spotted fever 1970   hospitalized x 1 month, had a blood transfusion   Thrombocytosis 2014   Uterine fibroid 11/07/2012    Past Surgical History:  Procedure Laterality Date   CESAREAN SECTION  1993   COLPOSCOPY  2010   ESSURE TUBAL LIGATION  2010   HYSTEROSCOPY WITH D & C N/A 12/17/2012  Procedure: DILATATION AND CURETTAGE /HYSTEROSCOPY AND RESECTOSCOPE;  Surgeon: Jacqualin Combes de Gwenevere Ghazi, MD;  Location: [facility];  Service: Gynecology;  Laterality: N/A;   PELVIC LAPAROSCOPY     TUBAL LIGATION     Essure    Family History  Problem Relation Age of Onset   Dementia Mother    Diabetes Father    Heart disease Father    Kidney disease Father     Social History   Socioeconomic History   Marital status: Married    Spouse name: Not on file   Number of children: Not on file   Years of education: Not on file   Highest education level: Not on file  Occupational History   Not on file  Tobacco Use    Smoking status: Every Day    Packs/day: 1.00    Years: 35.00    Additional pack years: 0.00    Total pack years: 35.00    Types: Cigarettes    Start date: 12/09/1978   Smokeless tobacco: Never  Vaping Use   Vaping Use: Never used  Substance and Sexual Activity   Alcohol use: Yes    Alcohol/week: 1.0 standard drink of alcohol    Types: 1 Glasses of wine per week   Drug use: No   Sexual activity: Yes    Partners: Male    Birth control/protection: Other-see comments    Comment: Essure  Other Topics Concern   Not on file  Social History Narrative   Not on file   Social Determinants of Health   Financial Resource Strain: Not on file  Food Insecurity: Not on file  Transportation Needs: Not on file  Physical Activity: Not on file  Stress: Not on file  Social Connections: Not on file  Intimate Partner Violence: Not on file   Review of Systems  Constitutional: Negative.  Negative for fatigue.  Respiratory:  Negative for cough and shortness of breath.   Cardiovascular: Negative.  Negative for chest pain.  Psychiatric/Behavioral: Negative.     Vitals:   04/28/22 1603  BP: 112/62  Pulse: 81  SpO2: 96%   Physical Exam Constitutional:      Appearance: Normal appearance. She is well-developed.  HENT:     Head: Normocephalic and atraumatic.     Mouth/Throat:     Mouth: Mucous membranes are moist.  Eyes:     General:        Right eye: No discharge.        Left eye: No discharge.     Conjunctiva/sclera: Conjunctivae normal.     Pupils: Pupils are equal, round, and reactive to light.  Neck:     Thyroid: No thyromegaly.     Trachea: No tracheal deviation.  Cardiovascular:     Rate and Rhythm: Normal rate and regular rhythm.     Heart sounds: No murmur heard.    No friction rub.  Pulmonary:     Effort: Pulmonary effort is normal. No respiratory distress.     Breath sounds: Normal breath sounds. No stridor. No wheezing or rhonchi.  Musculoskeletal:     Cervical  back: No rigidity or tenderness.  Neurological:     Mental Status: She is alert.  Psychiatric:        Mood and Affect: Mood normal.    Data Reviewed: Previous CAT scan reviewed, Recent PFT reviewed  Assessment:   Chronic obstructive pulmonary disease  Lung nodules -Resolved  Active smoker -She is willing to quit smoking  An active smoker  Plan/Recommendations:  Wellbutrin 150 mg twice daily sent to pharmacy Nicotine patch 14 mg sent to pharmacy  Follow-up in 6 months  Was not able to do the PFT as requested  Encouraged to call with significant concerns   Virl Diamond MD [facility] 04/28/2022, 4:10 PM  CC: Rick Duff, PA-C

## 2022-04-28 NOTE — Patient Instructions (Addendum)
Prescription for nicotine patch sent to pharmacy  Prescription for Wellbutrin sent to pharmacy  Both agents will help you in your plans to quit smoking  Continue using albuterol as needed Continue using Stiolto  I will see you back in 6 months  Call with significant concerns

## 2022-10-25 ENCOUNTER — Ambulatory Visit: Payer: 59 | Admitting: Pulmonary Disease

## 2022-10-25 ENCOUNTER — Encounter: Payer: Self-pay | Admitting: Pulmonary Disease

## 2022-10-25 VITALS — BP 130/86 | HR 97 | Ht 64.0 in | Wt 120.0 lb

## 2022-10-25 DIAGNOSIS — R0609 Other forms of dyspnea: Secondary | ICD-10-CM | POA: Diagnosis not present

## 2022-10-25 DIAGNOSIS — J449 Chronic obstructive pulmonary disease, unspecified: Secondary | ICD-10-CM

## 2022-10-25 MED ORDER — ALBUTEROL SULFATE HFA 108 (90 BASE) MCG/ACT IN AERS: 2.0000 | INHALATION_SPRAY | Freq: Four times a day (QID) | RESPIRATORY_TRACT | 6 refills | Status: DC | PRN

## 2022-10-25 MED ORDER — STIOLTO RESPIMAT 2.5-2.5 MCG/ACT IN AERS: 2.0000 | INHALATION_SPRAY | Freq: Every day | RESPIRATORY_TRACT | 5 refills | Status: AC

## 2022-10-25 NOTE — Patient Instructions (Signed)
Will see you in 6 months  Prescription for your rescue inhaler sent to pharmacy  Prescription for Stiolto your maintenance inhaler sent to pharmacy  Call us with significant concerns  Regular exercises

## 2022-10-25 NOTE — Progress Notes (Signed)
Ashley Mosley    [id number]    Oct 07, 1960  Primary Care Physician:Michaels, Margaretmary Dys, PA-C  Referring Physician: Rick Duff, PA-C [address] [place],  [place] [address]  Chief complaint:   Patient with obstructive lung disease, shortness of breath, in for follow-up   HPI:  Denies any significant symptoms today  Still with significant shortness of breath with activity  Has not been compliant with inhalers  Still smokes about half a pack pack of cigarettes a day  Did try the nicotine patch for a while but has not been using it regularly  She is taking care of 2 elderly parents  She feels she is functioning well but short of breath with activity  Activity level remains about the same   PFT with moderate obstructive disease, previous CT with emphysema  Last CT was in 2023 March An active smoker, 40-pack-year smoking history, currently smoking half to three quarters of a pack -Not likely to quit -She states she has cut down significantly  Did have hemoptysis in the past that self resolved   Outpatient Encounter Medications as of 10/25/2022  Medication Sig   albuterol (VENTOLIN HFA) 108 (90 Base) MCG/ACT inhaler Inhale 2 puffs into the lungs every 6 (six) hours as needed for wheezing or shortness of breath.   nicotine (NICODERM CQ) 14 mg/24hr patch Place 1 patch (14 mg total) onto the skin daily.   Tiotropium Bromide-Olodaterol (STIOLTO RESPIMAT) 2.5-2.5 MCG/ACT AERS Inhale 2 puffs into the lungs daily.   [DISCONTINUED] aspirin EC 81 MG tablet Take 81 mg by mouth every 4 (four) hours as needed for mild pain. Swallow whole.   [DISCONTINUED] buPROPion (WELLBUTRIN SR) 150 MG 12 hr tablet Take 1 tablet (150 mg total) by mouth 2 (two) times daily.   [DISCONTINUED] fluticasone (FLONASE) 50 MCG/ACT nasal spray Place into both nostrils. (Patient not taking: Reported on 04/28/2022)   No facility-administered encounter medications on file as of 10/25/2022.     Allergies as of 10/25/2022 - Review Complete 10/25/2022  Allergen Reaction Noted   Codeine Nausea Only 12/09/2012    Past Medical History:  Diagnosis Date   Abnormal finding on mammography, microcalcification 06/17/2012   Abnormal Pap smear 2007, 2012, 2017   Colposcopy 2017 - LGSIL.   Abnormal uterine bleeding    Acid reflux 02/05/2008   Overview:  Esophageal Reflux    Anemia    Anxiety    B-complex deficiency 09/07/2006   Overview:  Vitamin B12 Deficiency    Blood transfusion without reported diagnosis    age 62   COPD with emphysema (HCC)    Depression    Dizzy    Dysmenorrhea    Dyspnea on exertion    Elevated hemoglobin A1c 2014   Endometrial mass 11/07/2012   Endometriosis    Fibroid 2012, 2015, 2017       Gastric ulcer 2014   History of kidney stones    Lung nodules 2019   sees pulmonology   Major depressive disorder, single episode, severe, specified as with psychotic behavior 07/22/2012   Nervous breakdown 07/2013   [facility] in [place]   PTSD (post-traumatic stress disorder) 2018   PTSD (post-traumatic stress disorder)    [facility] spotted fever 1970   hospitalized x 1 month, had a blood transfusion   Thrombocytosis 2014   Uterine fibroid 11/07/2012    Past Surgical History:  Procedure Laterality Date   CESAREAN SECTION  1993  COLPOSCOPY  2010   ESSURE TUBAL LIGATION  2010   HYSTEROSCOPY WITH D & C N/A 12/17/2012   Procedure: DILATATION AND CURETTAGE /HYSTEROSCOPY AND RESECTOSCOPE;  Surgeon: Jacqualin Combes de Gwenevere Ghazi, MD;  Location: [facility];  Service: Gynecology;  Laterality: N/A;   PELVIC LAPAROSCOPY     TUBAL LIGATION     Essure    Family History  Problem Relation Age of Onset   Dementia Mother    Diabetes Father    Heart disease Father    Kidney disease Father     Social History   Socioeconomic History   Marital status: Married    Spouse name: Not on file   Number of children: Not on file   Years of education:  Not on file   Highest education level: Not on file  Occupational History   Not on file  Tobacco Use   Smoking status: Every Day    Current packs/day: 1.00    Average packs/day: 1 pack/day for 43.9 years (43.9 ttl pk-yrs)    Types: Cigarettes    Start date: 12/09/1978   Smokeless tobacco: Never  Vaping Use   Vaping status: Never Used  Substance and Sexual Activity   Alcohol use: Yes    Alcohol/week: 1.0 standard drink of alcohol    Types: 1 Glasses of wine per week   Drug use: No   Sexual activity: Yes    Partners: Male    Birth control/protection: Other-see comments    Comment: Essure  Other Topics Concern   Not on file  Social History Narrative   Not on file   Social Determinants of Health   Financial Resource Strain: Low Risk  (07/28/2022)   Received from [facility]   Overall Financial Resource Strain (CARDIA)    Difficulty of Paying Living Expenses: Not hard at all  Food Insecurity: No Food Insecurity (07/28/2022)   Received from [facility]   Hunger Vital Sign    Worried About Running Out of Food in the Last Year: Never true    Ran Out of Food in the Last Year: Never true  Transportation Needs: No Transportation Needs (07/28/2022)   Received from [facility] - Transportation    Lack of Transportation (Medical): No    Lack of Transportation (Non-Medical): No  Physical Activity: Not on file  Stress: Not on file  Social Connections: Unknown (05/21/2021)   Received from [facility], [facility]   Social Network    Social Network: Not on file  Intimate Partner Violence: Unknown (04/12/2021)   Received from [facility], [facility]   HITS    Physically Hurt: Not on file    Insult or Talk Down To: Not on file    Threaten Physical Harm: Not on file    Scream or Curse: Not on file   Review of Systems  Constitutional: Negative.  Negative for fatigue.  Respiratory:  Negative for cough and shortness of breath.   Cardiovascular: Negative.   Negative for chest pain.  Psychiatric/Behavioral: Negative.     Vitals:   10/25/22 1510  BP: 130/86  Pulse: 97  SpO2: 98%   Physical Exam Constitutional:      Appearance: Normal appearance. She is well-developed.  HENT:     Head: Normocephalic and atraumatic.     Nose: Nose normal.     Mouth/Throat:     Mouth: Mucous membranes are moist.  Eyes:     General:  Right eye: No discharge.        Left eye: No discharge.     Conjunctiva/sclera: Conjunctivae normal.     Pupils: Pupils are equal, round, and reactive to light.  Neck:     Thyroid: No thyromegaly.     Trachea: No tracheal deviation.  Cardiovascular:     Rate and Rhythm: Normal rate and regular rhythm.     Heart sounds: No murmur heard.    No friction rub.  Pulmonary:     Effort: Pulmonary effort is normal. No respiratory distress.     Breath sounds: Normal breath sounds. No stridor. No wheezing or rhonchi.  Musculoskeletal:     Cervical back: No rigidity or tenderness.  Neurological:     Mental Status: She is alert.  Psychiatric:        Mood and Affect: Mood normal.    Data Reviewed: Previous CAT scan reviewed,  Recent PFT reviewed  Assessment:   Chronic obstructive pulmonary disease -Continues with Stiolto -Has not been using any rescue inhaler  Lung nodules -Resolved  Active smoker -She is willing to quit smoking -Has not been able to quit yet, still smokes about half a pack of cigarettes   Plan/Recommendations:  Wellbutrin 150 mg twice daily sent to pharmacy Nicotine pat continue efforts to try and quit smoking  Continue Stiolto  Albuterol to be used as needed  Follow-up in about 6 months  Was not able to do the PFT as requested  Encouraged to call with significant concerns   Virl Diamond MD [facility] 10/25/2022, 3:34 PM  CC: Rick Duff, PA-C

## 2023-04-05 ENCOUNTER — Other Ambulatory Visit: Payer: Self-pay | Admitting: Obstetrics and Gynecology

## 2023-04-05 DIAGNOSIS — Z1231 Encounter for screening mammogram for malignant neoplasm of breast: Secondary | ICD-10-CM

## 2023-04-12 ENCOUNTER — Ambulatory Visit
Admission: RE | Admit: 2023-04-12 | Discharge: 2023-04-12 | Disposition: A | Discharge status: Home / Self Care | Source: Ambulatory Visit | Attending: Obstetrics and Gynecology | Admitting: Obstetrics and Gynecology

## 2023-04-12 DIAGNOSIS — Z1231 Encounter for screening mammogram for malignant neoplasm of breast: Secondary | ICD-10-CM

## 2023-04-16 ENCOUNTER — Ambulatory Visit: Admitting: Pulmonary Disease

## 2023-04-16 ENCOUNTER — Encounter: Payer: Self-pay | Admitting: Pulmonary Disease

## 2023-04-16 VITALS — BP 116/71 | HR 90 | Ht 64.0 in | Wt 119.2 lb

## 2023-04-16 DIAGNOSIS — J449 Chronic obstructive pulmonary disease, unspecified: Secondary | ICD-10-CM

## 2023-04-16 MED ORDER — ALBUTEROL SULFATE HFA 108 (90 BASE) MCG/ACT IN AERS: 2.0000 | INHALATION_SPRAY | Freq: Four times a day (QID) | RESPIRATORY_TRACT | 6 refills | Status: AC | PRN

## 2023-04-16 MED ORDER — AMOXICILLIN-POT CLAVULANATE 875-125 MG PO TABS: 1.0000 | ORAL_TABLET | Freq: Two times a day (BID) | ORAL | 0 refills | Status: AC

## 2023-04-16 MED ORDER — PREDNISONE 20 MG PO TABS: 20.0000 mg | ORAL_TABLET | Freq: Every day | ORAL | 0 refills | Status: AC

## 2023-04-16 NOTE — Progress Notes (Signed)
 Ashley Mosley    [id number]    May 07, 1960  Primary Care Physician:Michaels, Margaretmary Dys, PA-C  Referring Physician: Rick Duff, PA-C [address] [place],  [place] [address]  Chief complaint:   Patient with obstructive lung disease, shortness of breath, in for follow-up   HPI:  Was sick recently, used a course of antibiotics  Still has some secretions, current secretions is yellow-green No fevers  Has not been compliant with inhalers does have albuterol to use  Has not been using Stiolto  She continues to smoke  Does get short of breath with activity  She does complain of some chest discomfort between her shoulder blades  Activity level remains about the same overall   PFT with moderate obstructive disease, previous CT with emphysema  Last CT was in 2023 March An active smoker, 40-pack-year smoking history, currently smoking half to three quarters of a pack -Not likely to quit -She states she has cut down significantly  Did have hemoptysis in the past that self resolved   Outpatient Encounter Medications as of 04/16/2023  Medication Sig   albuterol (VENTOLIN HFA) 108 (90 Base) MCG/ACT inhaler Inhale 2 puffs into the lungs every 6 (six) hours as needed for wheezing or shortness of breath.   nicotine (NICODERM CQ) 14 mg/24hr patch Place 1 patch (14 mg total) onto the skin daily. (Patient not taking: Reported on 04/16/2023)   Tiotropium Bromide-Olodaterol (STIOLTO RESPIMAT) 2.5-2.5 MCG/ACT AERS Inhale 2 puffs into the lungs daily. (Patient not taking: Reported on 04/16/2023)   No facility-administered encounter medications on file as of 04/16/2023.    Allergies as of 04/16/2023 - Review Complete 04/16/2023  Allergen Reaction Noted   Codeine Nausea Only 12/09/2012   Doxycycline Palpitations 04/16/2023    Past Medical History:  Diagnosis Date   Abnormal finding on mammography, microcalcification 06/17/2012   Abnormal Pap smear 2007, 2012, 2017    Colposcopy 2017 - LGSIL.   Abnormal uterine bleeding    Acid reflux 02/05/2008   Overview:  Esophageal Reflux    Anemia    Anxiety    B-complex deficiency 09/07/2006   Overview:  Vitamin B12 Deficiency    Blood transfusion without reported diagnosis    age 88   COPD with emphysema (HCC)    Depression    Dizzy    Dysmenorrhea    Dyspnea on exertion    Elevated hemoglobin A1c 2014   Endometrial mass 11/07/2012   Endometriosis    Fibroid 2012, 2015, 2017       Gastric ulcer 2014   History of kidney stones    Lung nodules 2019   sees pulmonology   Major depressive disorder, single episode, severe, specified as with psychotic behavior 07/22/2012   Nervous breakdown 07/2013   [facility] in [place]   PTSD (post-traumatic stress disorder) 2018   PTSD (post-traumatic stress disorder)    [facility] spotted fever 1970   hospitalized x 1 month, had a blood transfusion   Thrombocytosis 2014   Uterine fibroid 11/07/2012    Past Surgical History:  Procedure Laterality Date   CESAREAN SECTION  1993   COLPOSCOPY  2010   ESSURE TUBAL LIGATION  2010   HYSTEROSCOPY WITH D & C N/A 12/17/2012   Procedure: DILATATION AND CURETTAGE /HYSTEROSCOPY AND RESECTOSCOPE;  Surgeon: Jacqualin Combes de Gwenevere Ghazi, MD;  Location: [facility];  Service: Gynecology;  Laterality: N/A;   PELVIC LAPAROSCOPY  TUBAL LIGATION     Essure    Family History  Problem Relation Age of Onset   Dementia Mother    Diabetes Father    Heart disease Father    Kidney disease Father     Social History   Socioeconomic History   Marital status: Married    Spouse name: Not on file   Number of children: Not on file   Years of education: Not on file   Highest education level: Not on file  Occupational History   Not on file  Tobacco Use   Smoking status: Every Day    Current packs/day: 1.00    Average packs/day: 1 pack/day for 44.4 years (44.4 ttl pk-yrs)    Types: Cigarettes    Start date:  12/09/1978   Smokeless tobacco: Never   Tobacco comments:    Half a pack or 1 pack a day 04/16/2023  Vaping Use   Vaping status: Never Used  Substance and Sexual Activity   Alcohol use: Yes    Alcohol/week: 1.0 standard drink of alcohol    Types: 1 Glasses of wine per week   Drug use: No   Sexual activity: Yes    Partners: Male    Birth control/protection: Other-see comments    Comment: Essure  Other Topics Concern   Not on file  Social History Narrative   Not on file   Social Drivers of Health   Financial Resource Strain: Low Risk  (03/22/2023)   Received from [facility]   Overall Financial Resource Strain (CARDIA)    Difficulty of Paying Living Expenses: Not hard at all  Food Insecurity: No Food Insecurity (03/22/2023)   Received from [facility]   Hunger Vital Sign    Worried About Running Out of Food in the Last Year: Never true    Ran Out of Food in the Last Year: Never true  Transportation Needs: No Transportation Needs (03/22/2023)   Received from [facility] - Transportation    Lack of Transportation (Medical): No    Lack of Transportation (Non-Medical): No  Physical Activity: Not on file  Stress: Not on file  Social Connections: Unknown (05/21/2021)   [facility], [facility]   Social Network    Social Network: Not on file  Intimate Partner Violence: Unknown (04/12/2021)   Received from [facility], [facility]   HITS    Physically Hurt: Not on file    Insult or Talk Down To: Not on file    Threaten Physical Harm: Not on file    Scream or Curse: Not on file   Review of Systems  Constitutional: Negative.  Negative for fatigue.  Respiratory:  Positive for cough and shortness of breath.   Cardiovascular: Negative.  Negative for chest pain.  Psychiatric/Behavioral: Negative.     Vitals:   04/16/23 1337  BP: 116/71  Pulse: 90  SpO2: 95%   Physical Exam Constitutional:      Appearance: Normal appearance. She is  well-developed.  HENT:     Head: Normocephalic and atraumatic.     Nose: Nose normal.     Mouth/Throat:     Mouth: Mucous membranes are moist.  Eyes:     General:        Right eye: No discharge.        Left eye: No discharge.     Conjunctiva/sclera: Conjunctivae normal.     Pupils: Pupils are equal, round, and reactive to light.  Neck:  Thyroid: No thyromegaly.     Trachea: No tracheal deviation.  Cardiovascular:     Rate and Rhythm: Normal rate and regular rhythm.     Heart sounds: No murmur heard.    No friction rub.  Pulmonary:     Effort: Pulmonary effort is normal. No respiratory distress.     Breath sounds: Normal breath sounds. No stridor. No wheezing or rhonchi.  Musculoskeletal:     Cervical back: No rigidity or tenderness.  Neurological:     Mental Status: She is alert.  Psychiatric:        Mood and Affect: Mood normal.    Data Reviewed: Previous CAT scan reviewed, 04/08/2020  Recent PFT reviewed-last PFT 07/25/2019-moderate obstruction with no significant bronchodilator response  Assessment:   Chronic obstructive pulmonary disease with exacerbation of symptoms -Has not been using Stiolto -She does use her rescue inhaler infrequently  May have an exacerbation at present -I did offer a course of antibiotics-Augmentin and steroids  Active smoker -Smoking cessation counseling  Plan/Recommendations:  Encouraged to resume Stiolto  Prescription for Augmentin sent to pharmacy  Prescription for short course of steroids sent to pharmacy  Encouraged to continue to work on quitting smoking  Follow-up in about 6 months  May use Mucinex to help clear secretions  A flutter device may also be used to help with secretion clearance  Virl Diamond MD [place] 04/16/2023, 1:46 PM  CC: Rick Duff, PA-C

## 2023-04-16 NOTE — Patient Instructions (Addendum)
 Will call in a prescription for Augmentin - You can hold this in hand and start when needed  Will call in a course of prednisone  Research a flutter device - They can help clear secretions - If the mucus is thicker, you can use Mucinex to help you clear secretions  Let me know if you want [place] to refill Stiolto at any time, this should be used daily, use albuterol as needed   I will see you back in 6 months
